# Patient Record
Sex: Female | Born: 1950 | Race: Black or African American | Hispanic: No | Marital: Married | Smoking: Former smoker
Health system: Southern US, Community
[De-identification: ages and names within clinical notes are randomized; demographics above are authoritative.]

## PROBLEM LIST (undated history)

## (undated) ENCOUNTER — Inpatient Hospital Stay: Discharge: 2014-07-08 | Disposition: A | Discharge status: Home / Self Care

## (undated) DIAGNOSIS — M545 Low back pain, unspecified: Secondary | ICD-10-CM

## (undated) DIAGNOSIS — J441 Chronic obstructive pulmonary disease with (acute) exacerbation: Principal | ICD-10-CM

## (undated) DIAGNOSIS — Z1231 Encounter for screening mammogram for malignant neoplasm of breast: Secondary | ICD-10-CM

## (undated) DIAGNOSIS — M1990 Unspecified osteoarthritis, unspecified site: Secondary | ICD-10-CM

## (undated) DIAGNOSIS — R0602 Shortness of breath: Secondary | ICD-10-CM

## (undated) DIAGNOSIS — Z5189 Encounter for other specified aftercare: Secondary | ICD-10-CM

## (undated) DIAGNOSIS — K219 Gastro-esophageal reflux disease without esophagitis: Secondary | ICD-10-CM

## (undated) DIAGNOSIS — G912 (Idiopathic) normal pressure hydrocephalus: Secondary | ICD-10-CM

## (undated) DIAGNOSIS — R4182 Altered mental status, unspecified: Secondary | ICD-10-CM

## (undated) DIAGNOSIS — R000 Tachycardia, unspecified: Secondary | ICD-10-CM

## (undated) DIAGNOSIS — M419 Scoliosis, unspecified: Secondary | ICD-10-CM

## (undated) DIAGNOSIS — I10 Essential (primary) hypertension: Secondary | ICD-10-CM

## (undated) DIAGNOSIS — E785 Hyperlipidemia, unspecified: Secondary | ICD-10-CM

## (undated) DIAGNOSIS — E119 Type 2 diabetes mellitus without complications: Secondary | ICD-10-CM

## (undated) DIAGNOSIS — I639 Cerebral infarction, unspecified: Principal | ICD-10-CM

## (undated) HISTORY — DX: Unspecified osteoarthritis, unspecified site: M19.90

## (undated) HISTORY — DX: Scoliosis, unspecified: M41.9

## (undated) HISTORY — PX: TUBAL LIGATION: SHX77

## (undated) HISTORY — PX: LUMBAR PUNCTURE: SHX1985

## (undated) HISTORY — DX: Tachycardia, unspecified: R00.0

## (undated) HISTORY — DX: Cerebral infarction, unspecified: I63.9

## (undated) HISTORY — PX: UPPER GASTROINTESTINAL ENDOSCOPY: SHX188

## (undated) HISTORY — DX: Encounter for other specified aftercare: Z51.89

---

## 1998-05-07 ENCOUNTER — Other Ambulatory Visit: Admission: RE | Admit: 1998-05-07 | Discharge: 1998-05-07 | Payer: Self-pay

## 1999-11-24 ENCOUNTER — Emergency Department (HOSPITAL_COMMUNITY): Admission: EM | Admit: 1999-11-24 | Discharge: 1999-11-24 | Payer: Self-pay | Admitting: Emergency Medicine

## 2001-05-12 ENCOUNTER — Ambulatory Visit (HOSPITAL_COMMUNITY): Admission: RE | Admit: 2001-05-12 | Discharge: 2001-05-12 | Payer: Self-pay | Admitting: Internal Medicine

## 2001-05-12 ENCOUNTER — Encounter: Payer: Self-pay | Admitting: Internal Medicine

## 2001-06-16 ENCOUNTER — Other Ambulatory Visit: Admission: RE | Admit: 2001-06-16 | Discharge: 2001-06-16 | Payer: Self-pay | Admitting: Internal Medicine

## 2004-09-23 ENCOUNTER — Inpatient Hospital Stay (HOSPITAL_COMMUNITY): Admission: EM | Admit: 2004-09-23 | Discharge: 2004-09-25 | Payer: Self-pay | Admitting: Emergency Medicine

## 2004-09-23 ENCOUNTER — Ambulatory Visit: Payer: Self-pay | Admitting: Cardiology

## 2004-09-24 ENCOUNTER — Encounter: Payer: Self-pay | Admitting: Cardiology

## 2005-10-26 ENCOUNTER — Emergency Department (HOSPITAL_COMMUNITY): Admission: EM | Admit: 2005-10-26 | Discharge: 2005-10-27 | Payer: Self-pay | Admitting: Emergency Medicine

## 2006-05-10 ENCOUNTER — Emergency Department (HOSPITAL_COMMUNITY): Admission: EM | Admit: 2006-05-10 | Discharge: 2006-05-10 | Payer: Self-pay | Admitting: Emergency Medicine

## 2006-05-22 ENCOUNTER — Emergency Department (HOSPITAL_COMMUNITY): Admission: EM | Admit: 2006-05-22 | Discharge: 2006-05-22 | Payer: Self-pay | Admitting: Emergency Medicine

## 2008-09-20 ENCOUNTER — Ambulatory Visit (HOSPITAL_COMMUNITY): Admission: RE | Admit: 2008-09-20 | Discharge: 2008-09-20 | Payer: Self-pay | Admitting: Internal Medicine

## 2009-08-12 ENCOUNTER — Emergency Department (HOSPITAL_COMMUNITY): Admission: EM | Admit: 2009-08-12 | Discharge: 2009-08-12 | Payer: Self-pay | Admitting: Emergency Medicine

## 2009-08-12 ENCOUNTER — Inpatient Hospital Stay (HOSPITAL_COMMUNITY): Admission: EM | Admit: 2009-08-12 | Discharge: 2009-08-15 | Payer: Self-pay | Admitting: Emergency Medicine

## 2011-01-01 ENCOUNTER — Other Ambulatory Visit (HOSPITAL_COMMUNITY): Payer: Self-pay | Admitting: Internal Medicine

## 2011-01-01 DIAGNOSIS — Z1231 Encounter for screening mammogram for malignant neoplasm of breast: Secondary | ICD-10-CM

## 2011-01-08 ENCOUNTER — Ambulatory Visit (HOSPITAL_COMMUNITY)
Admission: RE | Admit: 2011-01-08 | Discharge: 2011-01-08 | Disposition: A | Discharge status: Home / Self Care | Payer: 59 | Source: Ambulatory Visit | Attending: Internal Medicine | Admitting: Internal Medicine

## 2011-01-08 DIAGNOSIS — Z1231 Encounter for screening mammogram for malignant neoplasm of breast: Secondary | ICD-10-CM | POA: Insufficient documentation

## 2011-01-22 LAB — DIFFERENTIAL
Basophils Absolute: 0 10*3/uL (ref 0.0–0.1)
Basophils Relative: 0 % (ref 0–1)
Eosinophils Absolute: 0.1 10*3/uL (ref 0.0–0.7)
Eosinophils Relative: 1 % (ref 0–5)
Lymphocytes Relative: 38 % (ref 12–46)
Lymphs Abs: 3 K/uL (ref 0.7–4.0)
Monocytes Absolute: 0.4 10*3/uL (ref 0.1–1.0)
Monocytes Relative: 5 % (ref 3–12)
Neutro Abs: 4.5 K/uL (ref 1.7–7.7)
Neutrophils Relative %: 56 % (ref 43–77)

## 2011-01-22 LAB — POCT CARDIAC MARKERS: Myoglobin, poc: 96.6 ng/mL (ref 12–200)

## 2011-01-22 LAB — CBC
HCT: 41.4 % (ref 36.0–46.0)
Hemoglobin: 12.4 g/dL (ref 12.0–15.0)
Hemoglobin: 13.9 g/dL (ref 12.0–15.0)
MCHC: 33.6 g/dL (ref 30.0–36.0)
MCHC: 33.6 g/dL (ref 30.0–36.0)
MCV: 83.8 fL (ref 78.0–100.0)
MCV: 84.1 fL (ref 78.0–100.0)
Platelets: 205 10*3/uL (ref 150–400)
RBC: 4.4 MIL/uL (ref 3.87–5.11)
RBC: 4.94 MIL/uL (ref 3.87–5.11)
RDW: 13 % (ref 11.5–15.5)
RDW: 13.2 % (ref 11.5–15.5)
WBC: 8 K/uL (ref 4.0–10.5)

## 2011-01-22 LAB — LIPID PANEL
HDL: 25 mg/dL — ABNORMAL LOW (ref >39)
Total CHOL/HDL Ratio: 5.5 RATIO
Triglycerides: 248 mg/dL — ABNORMAL HIGH (ref <150)
VLDL: 50 mg/dL — ABNORMAL HIGH (ref 0–40)

## 2011-01-22 LAB — HEMOGLOBIN A1C
Hgb A1c MFr Bld: 9.4 % — ABNORMAL HIGH (ref 4.6–6.1)
Mean Plasma Glucose: 223 mg/dL

## 2011-01-22 LAB — BASIC METABOLIC PANEL
BUN: 14 mg/dL (ref 6–23)
CO2: 28 mEq/L (ref 19–32)
CO2: 29 mEq/L (ref 19–32)
Calcium: 8.9 mg/dL (ref 8.4–10.5)
Calcium: 9.2 mg/dL (ref 8.4–10.5)
Chloride: 98 mEq/L (ref 96–112)
Creatinine, Ser: 0.8 mg/dL (ref 0.4–1.2)
GFR calc Af Amer: >60 mL/min (ref >60)
Glucose, Bld: 163 mg/dL — ABNORMAL HIGH (ref 70–99)
Glucose, Bld: 262 mg/dL — ABNORMAL HIGH (ref 70–99)
Sodium: 134 mEq/L — ABNORMAL LOW (ref 135–145)
Sodium: 136 mEq/L (ref 135–145)

## 2011-01-22 LAB — GLUCOSE, CAPILLARY
Glucose-Capillary: 182 mg/dL — ABNORMAL HIGH (ref 70–99)
Glucose-Capillary: 230 mg/dL — ABNORMAL HIGH (ref 70–99)
Glucose-Capillary: 254 mg/dL — ABNORMAL HIGH (ref 70–99)

## 2011-01-22 LAB — CK TOTAL AND CKMB (NOT AT ARMC)
CK, MB: 2.6 ng/mL (ref 0.3–4.0)
Relative Index: 1.7 (ref 0.0–2.5)

## 2011-01-22 LAB — BASIC METABOLIC PANEL WITH GFR
Chloride: 99 meq/L (ref 96–112)
GFR calc Af Amer: >60 mL/min (ref >60)
GFR calc non Af Amer: >60 mL/min (ref >60)
Potassium: 3.9 meq/L (ref 3.5–5.1)

## 2011-01-22 LAB — CARDIAC PANEL(CRET KIN+CKTOT+MB+TROPI)
CK, MB: 1.6 ng/mL (ref 0.3–4.0)
CK, MB: 1.8 ng/mL (ref 0.3–4.0)
Relative Index: 1.3 (ref 0.0–2.5)
Total CK: 140 U/L (ref 7–177)
Troponin I: 0.01 ng/mL (ref 0.00–0.06)

## 2011-03-06 NOTE — Discharge Summary (Signed)
NAME:  Dawn [name], Dawn [name]                ACCOUNT NO.:  [id number]   MEDICAL RECORD NO.:  [id number]          PATIENT TYPE:  INP   LOCATION:  4731                         FACILITY:  [facility]   PHYSICIAN:  Mobolaji B. Bakare, M.D.DATE OF BIRTH:  [birth year]   DATE OF ADMISSION:  09/23/2004  DATE OF DISCHARGE:  09/25/2004                                 DISCHARGE SUMMARY   PRIMARY CARE PHYSICIAN:  Cala Bradford R. Renae Gloss, M.D.   FINAL DIAGNOSES:  Flash pulmonary edema.  Hypertensive emergency.  Non-  compliance with medication.  Diabetes mellitus.  Hypertension.  Microcytic  anemia.  Positive 2D echo which showed ejection fraction of 55 to 65%.  Normal right ventricular size and function, normal left ventricular  function.   CHIEF COMPLAINT:  Cough and shortness of breath.   BRIEF HISTORY:  Please refer to admission H&P for full details.  In brief,  the patient is a 60 year old, African-American female who has a history of  diabetes, hypertension, and she has not been compliant with her medications  because of insurance issues and co-payments.  She presented with acute onset  of shortness of breath, wheezing, there was no accompanying palpitations,  chest pain, diaphoresis.  She was brought to the emergency department in the  company of her husband and evaluation of her blood pressure was 260/140 with  a heart rate of 152 and a respiratory rate of 24.  Chest x-ray showed that  she was in acute pulmonary edema.  She was given 0.2 mg of clonidine in the  emergency department and subsequently admitted onto telemetry for further  treatment and evaluation.   PERTINENT FINDINGS ON PHYSICAL EXAMINATION:  VITAL SIGNS:  Initial vitals as  stated above.  GENERAL:  On examination, she was not in acute obvious respiration at the  time of our evaluation.  No elevated JVD.  No thyromegaly.  No carotid  bruit.  LUNGS:  Decreased breath sounds bilaterally with some bibasilar crackles.  CARDIOVASCULAR:  S1 and  S2.  Regular.  No murmur.  Tachycardic.  ABDOMEN:  Obese, soft, non-tender.  No hepatosplenomegaly.  Bowel sounds are  present.  No holosystolic murmur.  EXTREMITIES:  No pedal edema.  CNS:  No neurological deficit.   PERTINENT LABORATORY AND X-RAY DATA:  White cell count of 13.5, hemoglobin  11.1, hematocrit 24.6, MCV 67.9, RDW 17.7.  Neutrophils __________  %,  lymphocytes 15%, sodium 134, potassium 3.6, chloride 99, CO2 26, glucose  309, BUN 4, creatinine 0.8, total protein 7.4, albumin 3.5, glycosylated  hemoglobin - hemoglobin A1c 10.3, troponin 0.11, fasting lipid profile with  a cholesterol of 129, triglyceride 205, HDL 30, VLDL 41, LDL 58.  TSH 0.49,  free T4 1.13, iron 32, TIBC 362, saturation 9%, ferritin 40, UA was  negative.  Urine drug screen was negative.  Blood culture negative.  D-dimer  0.82.  BNP 126.   Chest x-ray showed congestive heart failure with cardiomegaly.  EKG showed  sinus tachycardia with fusion complexes, right atrial enlargement,  __________  voltage criteria for LVH, left axis deviation, and ST and T-wave  non-specific abnormalities.  HOSPITAL COURSE:  The patient was admitted onto telemetry and her blood  pressure was gradually down by way of instituting home medications and also  giving hydralazine IV p.r.n.Marland Kitchen  She received Lasix IV.  She diuresed quite  well.  There was no significant arrhythmia on telemetry.  Her troponin  trended downwards and this was felt to be rate related and also secondary to  acute pulmonary congestion.  Despite instituting the patient's home  antihypertensives, the blood pressure was still uncontrolled, hence  hydrochlorothiazide 25 mg was added.  The blood pressure improved gradually  and at the time of discharge, blood pressure was 140/70.   Diabetes.  The patient's hemoglobin A1c was quite elevated and she did admit  that she has not been taking her medications.  She was restarted on her  Glucophage 1 g p.o. b.i.d. and  glipizide 10 mg p.o. b.i.d.  This should  suffice for now until she rechecks her hemoglobin A1c in three to six months  time.   Microcytic anemia.  The patient's hemoglobin and hematocrit were within  normal; however, she has a ______mcv____  of 68.5.  She does not have any  history of sickle cell.  Iron studies revealed ferritin on the low side of  normal and TIBC on the upper limit of normal and she was started on iron  supplementation.   Leukocytosis and low grade fever at time of admission.  The patient was  empirically started on Avelox for presume pneumonia.  This was discontinued  when all cultures turned negative and the patient did not have any  infiltrates on chest x-ray clinically as pneumonia.   Overall, the patient's problem was precipitated by non-compliance with  medications.  She was seen by the care manager to help with her insurance.  The patient stated that she would have to re-organize her insurance.   DISCHARGE MEDICATIONS:  Aspirin 81 mg p.o. daily, Glucotrol ER 10 mg p.o.  b.i.d., Glucophage 1 g two times per day, Diovan 160 once a day,  hydrochlorothiazide 25 mg one per day, ferrous sulfate three to four p.o.  b.i.d.   DIET:  Low cholesterol diet.  Diabetic diet.   The patient was instructed to exercise regularly and try to lose some  weight.   CONDITION ON DISCHARGE:  She was stable clinically and asymptomatic at the  time of discharge.   FOLLOW UP:  Follow up with Dr. Kellie Shropshire in one to two weeks.  Patient is  to call for an appointment.   RECOMMENDATIONS:  Recheck TSH in six months.      Mobo   MBB/MEDQ  D:  10/06/2004  T:  10/06/2004  Job:  [id number]   cc:   Merlene Laughter. Renae Gloss, M.D.  [address]  [address]  [place]  [place] [address]  Fax: [phone]

## 2011-03-06 NOTE — H&P (Signed)
NAMESILVER, ACHEY                ACCOUNT NO.:  [id number]   MEDICAL RECORD NO.:  [id number]          PATIENT TYPE:  INP   LOCATION:  1828                         FACILITY:  [facility]   PHYSICIAN:  Michaelyn Barter, M.D. DATE OF BIRTH:  [birth year]   DATE OF ADMISSION:  09/23/2004  DATE OF DISCHARGE:                                HISTORY & PHYSICAL   PRIMARY CARE PHYSICIAN:  Kellie Shropshire, M.D.   CHIEF COMPLAINT:  Cough, shortness of breath and headache.   HISTORY OF PRESENT ILLNESS:  The patient is a 60 year old female who states  that two nights ago she developed a productive cough which was described as  constant and lasted for several hours.  Approximately an hour or so after  the cough ensued the patient became short of breath.  She felt herself  wheezing at the time.  Since then she has denied having any nausea,  vomiting, fevers or chills, so that her temperature was measured at home and  was noted to be 99.  She also denies chest pain.  When asked about the  progression of her shortness of breath, she initially stated that there was  no progression since its initial onset.  However, her husband is in the room  at her bedside and he states that the patient has been short of breath since  Sunday night.  The patient denies symptoms of orthopnea, stating that she  sleeps on two pillows and this has not changed over the past few days.  Likewise, she also denies symptoms of PND.  She developed a headache  yesterday which was relieved with Tylenol.  She denies having any visual  changes.  She went on to state that she has not taken any of her blood  pressure medications for at least one month.  The reason that she gives is  that she owes her primary care physician, Dr. Kellie Shropshire, several hundred  dollars and cannot afford to go back to see her primary physician because  she does not have the money at this particular time.   PAST MEDICAL HISTORY:  1.  Diabetes, diagnosed over 10  years ago.  2.  Hypertension.   PAST SURGICAL HISTORY:  None.   ALLERGIES:  NONE.   HOME MEDICATIONS:  1.  Glucophage 1000 mg p.o. b.i.d.  2.  Glipizide ER 10 mg one tablet p.o. b.i.d.  3.  Diovan 80 mg 1 tablet daily.   FAMILY HISTORY:  Mother has a history of hypertension and diabetes mellitus.  The father the patient does not know his medical history.   SOCIAL HISTORY:  Cigarettes.  The patient started at the age of 79 but  stopped in her 30s.  She smoked approximately one pack per day.  Alcohol:  Started in her 39s and stopped at the age of 89.  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   REVIEW OF SYSTEMS:  As per HPI, otherwise all other systems are negative.   PHYSICAL EXAM:  GENERAL:  There  is no obvious distress.  The patient is  cooperative.  VITALS:  On initial presentation into the emergency room the patient's  temperature was recorded as being 100.7, blood pressure 260/140, heart rate  152, respirations 24.  HEENT:  Anicteric, extraocular movements are intact.  Right pupil, there is  no papilledema present.  NECK:  Supple, no lymphadenopathy.  THYROID:  Not palpable, good carotid upstroke, no carotid bruits  auscultated.  CARDIAC:  S1, S2 present.  There are soft heart sounds/heart sounds are  slightly distant.  RESPIRATORY:  Breath sounds are decreased slightly bilaterally.  ABDOMEN:  Soft, nontender, nondistended.  Hypoactive bowel sounds.  EXTREMITIES:  No leg edema.  NEUROLOGIC:  The patient is alert and oriented x3.  MUSCULOSKELETAL:  Upper and lower extremity strength 5/5.   Chest x-ray is interpreted by the radiologist as cardiomegaly with edema.   LABS:  BUN is 4, creatinine is 0.8, glucose is 309.  BNP is 135.9.   ASSESSMENT/PLAN:  1.  Acute shortness of breath.  This is most likely to be multifactorial in      origin and the symptoms of which were most likely precipitated  by the      patient's hypertensive urgency.  With regards to the shortness of      breath, the patient's chest x-ray is consistent with pulmonary edema.      Likewise, the patient's chest x-ray reveals cardiomegaly, therefore,      although the patient does not have symptoms that are classic for      congestive heart failure, i.e. she denies paroxysmal nocturnal dyspnea      and orthopnea and her BNP is only slightly elevated.  There may be a      component of congestive heart failure present.  At this point in time,      however, the most important factor to control is the patient's blood      pressure.  For now will provide oxygen.  Will start Lasix 20 mg IV      b.i.d.  Will monitor the patient's ins and outs and daily weights.  Will      also order cardiac enzymes x3 q.8 hours apart.  Even though the patient      does not complain of any chest pain at this particular time, the patient      does have a history of diabetes mellitus and sometimes those patients      may have what is referred to as atypical or silent ischemia, therefore,      will rule out a cardiac event via checking cardiac enzymes.  Will also      follow the patient's BNP and will order a 2D echocardiogram to evaluate      the patient's ejection fraction and also look for any structural      abnormalities of the ventricles and valves.  2.  Hypertensive urgency.  This most likely precipitated #1 above and is      also due to the patient's lack of compliance with her antihypertensive      medications.  Will restart Diovan 80 mg daily.  Will also add      hydralazine 2 mg IV q.4 hours as a p.r.n. medication for now, titrating      up as needed.  May also consider adding something such as clonidine to      the patient's antihypertensive regimen primarily because this additional      medication is cheap in  cost and I am concerned that the patient may not      be able to afford to pay for her medications once she is discharged  from     the hospital, which may precipitate the same event that brought her into      the hospital this time.  3.  Diabetes mellitus.  Will restart the patient's home medication for now.      Will check her hemoglobin A1c and fasting lipid profile.  Will also      perform Accu-Cheks a.c. and q.h.s. and will start aspirin therapy.  4.  Cough with production.  This may have been secondary to the pulmonary      edema that was described in #1      above.  However, in light of fever will treat empirically with IV      antibiotics.  Will also send a sputum for Gram stain, culture and      sensitivity.  Will check blood cultures times two.  5.  Deep vein thrombosis prophylaxis.  Will provide Lovenox 40 mg      subcutaneously q.24 hours.      Orla   OR/MEDQ  D:  09/23/2004  T:  09/23/2004  Job:  [id number]   cc:   Merlene Laughter. Renae Gloss, M.D.  [address]  [address]  [place]  [place] [address]  Fax: [phone]

## 2011-12-17 ENCOUNTER — Other Ambulatory Visit (HOSPITAL_COMMUNITY): Payer: Self-pay | Admitting: Internal Medicine

## 2011-12-17 DIAGNOSIS — Z1231 Encounter for screening mammogram for malignant neoplasm of breast: Secondary | ICD-10-CM

## 2012-01-11 ENCOUNTER — Ambulatory Visit (HOSPITAL_COMMUNITY): Payer: 59 | Attending: Internal Medicine

## 2012-02-18 ENCOUNTER — Ambulatory Visit (HOSPITAL_COMMUNITY): Payer: 59

## 2012-02-18 ENCOUNTER — Ambulatory Visit (HOSPITAL_COMMUNITY)
Admission: RE | Admit: 2012-02-18 | Discharge: 2012-02-18 | Disposition: A | Discharge status: Home / Self Care | Payer: 59 | Source: Ambulatory Visit | Attending: Internal Medicine | Admitting: Internal Medicine

## 2012-02-18 DIAGNOSIS — Z1231 Encounter for screening mammogram for malignant neoplasm of breast: Secondary | ICD-10-CM

## 2012-07-19 LAB — CBC WITH DIFFERENTIAL
Atypical Lymphocytes: 3 %
Bands Relative: 4 % — ABNORMAL LOW (ref 5–11)
Basophils %: 0.7 %
Basophils Absolute: 0 10*3/uL (ref 0.0–0.2)
Eosinophils %: 2 %
Eosinophils Absolute: 0.1 10*3/uL (ref 0.0–0.7)
Hematocrit: 42.2 % (ref 37.0–47.0)
Hemoglobin: 14 g/dL (ref 12.0–16.0)
Lymphocytes %: 60 %
Lymphocytes Absolute: 3.3 10*3/uL (ref 1.0–4.8)
MCH: 29.2 pg (ref 27.0–31.3)
MCHC: 33.2 % (ref 33.0–37.0)
MCV: 88 fL (ref 82.0–100.0)
MPV: 9.6 fL (ref 7.4–10.4)
Monocytes %: 5.7 %
Monocytes Absolute: 6 10*3/uL — ABNORMAL HIGH (ref 0.2–0.8)
Neutrophils %: 26 %
Neutrophils Absolute: 1.6 10*3/uL (ref 1.4–6.5)
PLATELET SLIDE REVIEW: ADEQUATE
Platelets: 237 10*3/uL (ref 130–400)
RBC: 4.8 M/uL (ref 4.20–5.40)
RDW: 14.2 % (ref 11.5–14.5)
WBC: 5.3 10*3/uL (ref 4.8–10.8)

## 2012-07-19 LAB — COMPREHENSIVE METABOLIC PANEL
ALT: 32 U/L (ref 0–63)
AST: 29 U/L (ref 0–35)
Albumin: 4.1 g/dL (ref 3.5–5.2)
Alkaline Phosphatase: 82 U/L (ref 40–135)
Anion Gap: 10 mEq/L (ref 7–13)
BUN: 18 mg/dL (ref 10–26)
CO2: 31 mEq/L (ref 22–32)
Calcium: 9.4 mg/dL (ref 8.5–10.2)
Chloride: 104 mEq/L (ref 99–111)
Creatinine: 1.06 mg/dL (ref 0.50–1.10)
GFR African American: >60 (ref >60)
GFR Non-African American: 56.3 — ABNORMAL LOW (ref >60)
Globulin: 2.3 g/dL (ref 2.3–3.5)
Glucose: 110 mg/dL (ref 60–115)
Potassium: 4.4 mEq/L (ref 3.5–5.5)
Sodium: 145 mEq/L (ref 135–146)
Total Bilirubin: 0.4 mg/dL (ref 0.2–1.1)
Total Protein: 6.4 g/dL (ref 6.4–8.1)

## 2012-07-19 LAB — TROPONIN: Troponin I: 0 ng/dL (ref 0.000–0.040)

## 2013-01-09 ENCOUNTER — Encounter (HOSPITAL_COMMUNITY): Payer: Self-pay | Admitting: Emergency Medicine

## 2013-01-09 ENCOUNTER — Emergency Department (HOSPITAL_COMMUNITY): Payer: 59

## 2013-01-09 ENCOUNTER — Inpatient Hospital Stay (HOSPITAL_COMMUNITY)
Admission: EM | Admit: 2013-01-09 | Discharge: 2013-01-12 | DRG: 064 | Disposition: A | Discharge status: Home Health | Payer: 59 | Attending: Internal Medicine | Admitting: Internal Medicine

## 2013-01-09 DIAGNOSIS — I6350 Cerebral infarction due to unspecified occlusion or stenosis of unspecified cerebral artery: Principal | ICD-10-CM | POA: Diagnosis present

## 2013-01-09 DIAGNOSIS — G929 Unspecified toxic encephalopathy: Secondary | ICD-10-CM | POA: Diagnosis present

## 2013-01-09 DIAGNOSIS — G92 Toxic encephalopathy: Secondary | ICD-10-CM | POA: Diagnosis present

## 2013-01-09 DIAGNOSIS — E1142 Type 2 diabetes mellitus with diabetic polyneuropathy: Secondary | ICD-10-CM | POA: Diagnosis present

## 2013-01-09 DIAGNOSIS — R000 Tachycardia, unspecified: Secondary | ICD-10-CM

## 2013-01-09 DIAGNOSIS — E785 Hyperlipidemia, unspecified: Secondary | ICD-10-CM | POA: Diagnosis present

## 2013-01-09 DIAGNOSIS — IMO0002 Reserved for concepts with insufficient information to code with codable children: Secondary | ICD-10-CM

## 2013-01-09 DIAGNOSIS — I10 Essential (primary) hypertension: Secondary | ICD-10-CM

## 2013-01-09 DIAGNOSIS — E1165 Type 2 diabetes mellitus with hyperglycemia: Secondary | ICD-10-CM

## 2013-01-09 DIAGNOSIS — I739 Peripheral vascular disease, unspecified: Secondary | ICD-10-CM | POA: Diagnosis present

## 2013-01-09 DIAGNOSIS — E119 Type 2 diabetes mellitus without complications: Secondary | ICD-10-CM

## 2013-01-09 DIAGNOSIS — Z8673 Personal history of transient ischemic attack (TIA), and cerebral infarction without residual deficits: Secondary | ICD-10-CM

## 2013-01-09 DIAGNOSIS — N390 Urinary tract infection, site not specified: Secondary | ICD-10-CM

## 2013-01-09 DIAGNOSIS — M5126 Other intervertebral disc displacement, lumbar region: Secondary | ICD-10-CM | POA: Diagnosis present

## 2013-01-09 DIAGNOSIS — E1149 Type 2 diabetes mellitus with other diabetic neurological complication: Secondary | ICD-10-CM | POA: Diagnosis present

## 2013-01-09 DIAGNOSIS — Z794 Long term (current) use of insulin: Secondary | ICD-10-CM

## 2013-01-09 DIAGNOSIS — I517 Cardiomegaly: Secondary | ICD-10-CM | POA: Diagnosis present

## 2013-01-09 DIAGNOSIS — M47817 Spondylosis without myelopathy or radiculopathy, lumbosacral region: Secondary | ICD-10-CM | POA: Diagnosis present

## 2013-01-09 DIAGNOSIS — R262 Difficulty in walking, not elsewhere classified: Secondary | ICD-10-CM | POA: Diagnosis present

## 2013-01-09 DIAGNOSIS — I639 Cerebral infarction, unspecified: Secondary | ICD-10-CM

## 2013-01-09 DIAGNOSIS — R739 Hyperglycemia, unspecified: Secondary | ICD-10-CM

## 2013-01-09 DIAGNOSIS — R509 Fever, unspecified: Secondary | ICD-10-CM

## 2013-01-09 HISTORY — DX: Type 2 diabetes mellitus without complications: E11.9

## 2013-01-09 HISTORY — DX: Essential (primary) hypertension: I10

## 2013-01-09 LAB — COMPREHENSIVE METABOLIC PANEL
Albumin: 3.9 g/dL (ref 3.5–5.2)
Alkaline Phosphatase: 76 U/L (ref 39–117)
BUN: 17 mg/dL (ref 6–23)
Creatinine, Ser: 0.93 mg/dL (ref 0.50–1.10)
GFR calc Af Amer: 76 mL/min — ABNORMAL LOW (ref >90)
Glucose, Bld: 382 mg/dL — ABNORMAL HIGH (ref 70–99)
Potassium: 3.5 mEq/L (ref 3.5–5.1)
Total Protein: 8.2 g/dL (ref 6.0–8.3)

## 2013-01-09 LAB — CBC WITH DIFFERENTIAL/PLATELET
Basophils Relative: 0 % (ref 0–1)
Eosinophils Absolute: 0 10*3/uL (ref 0.0–0.7)
Eosinophils Relative: 0 % (ref 0–5)
HCT: 36.9 % (ref 36.0–46.0)
Hemoglobin: 13.1 g/dL (ref 12.0–15.0)
Lymphs Abs: 1.3 10*3/uL (ref 0.7–4.0)
MCH: 27.8 pg (ref 26.0–34.0)
MCHC: 35.5 g/dL (ref 30.0–36.0)
MCV: 78.3 fL (ref 78.0–100.0)
Monocytes Absolute: 0.5 10*3/uL (ref 0.1–1.0)
Monocytes Relative: 4 % (ref 3–12)
Neutrophils Relative %: 85 % — ABNORMAL HIGH (ref 43–77)
RBC: 4.71 MIL/uL (ref 3.87–5.11)

## 2013-01-09 LAB — URINALYSIS, ROUTINE W REFLEX MICROSCOPIC
Ketones, ur: NEGATIVE mg/dL
Leukocytes, UA: NEGATIVE
Nitrite: NEGATIVE
Protein, ur: NEGATIVE mg/dL
pH: 6.5 (ref 5.0–8.0)

## 2013-01-09 LAB — URINE MICROSCOPIC-ADD ON

## 2013-01-09 MED ORDER — SODIUM CHLORIDE 0.9 % IV BOLUS (SEPSIS)
1000.0000 mL | Freq: Once | INTRAVENOUS | Status: AC
  Administered 2013-01-09: 1000 mL via INTRAVENOUS

## 2013-01-09 MED ORDER — SODIUM CHLORIDE 0.9 % IV BOLUS (SEPSIS)
1000.0000 mL | Freq: Once | INTRAVENOUS | Status: AC
  Administered 2013-01-10: 1000 mL via INTRAVENOUS

## 2013-01-09 NOTE — ED Notes (Signed)
Patient resting of stretcher at this time. Patient denies any complaints at this time. Family member at bedside with patient. Plan of care discussed. Call light within reach. Will continue to monitor.

## 2013-01-09 NOTE — ED Notes (Signed)
FAMILY REPORTS GENERALIZED WEAKNESS WITH INCONTINENT ( STOOL) EPISODE THIS MORNING , HYPERTENSIVE AND TACHYCARDIC AT TRIAGE , ALERT AND ORIENTED X4 AT TRIAGE , RESPIRATIONS UNLABORED/ DENIES PAIN .

## 2013-01-09 NOTE — ED Provider Notes (Signed)
History     CSN: [id number]  Arrival date & time 01/09/13  2036   First MD Initiated Contact with Patient 01/09/13 2053      Chief Complaint  Patient presents with  . Weakness  . Hypertension  . Tachycardia    (Consider location/radiation/quality/duration/timing/severity/associated sxs/prior treatment) Patient is a 62 y.o. female presenting with weakness and hypertension.  Weakness Pertinent negatives include no chest pain, no abdominal pain, no headaches and no shortness of breath.  Hypertension Pertinent negatives include no chest pain, no abdominal pain, no headaches and no shortness of breath.   patient is without complaints. She states that she feels fine. She states she has had some urinary and fecal incontinence. No chest pain. No headache. Confusion. No weakness. No Back pain. She states she has had some urinary frequency. She does have a tachycardia and hypertension upon arrival. No chest pain.  Past Medical History  Diagnosis Date  . Diabetes mellitus without complication   . Hypertension     History reviewed. No pertinent past surgical history.  History reviewed. No pertinent family history.  History  Substance Use Topics  . Smoking status: Never Smoker   . Smokeless tobacco: Not on file  . Alcohol Use: No    OB History   Grav Para Term Preterm Abortions TAB SAB Ect Mult Living                  Review of Systems  Constitutional: Negative for fever, activity change and appetite change.  HENT: Negative for neck stiffness.   Eyes: Negative for pain.  Respiratory: Negative for chest tightness and shortness of breath.   Cardiovascular: Negative for chest pain and leg swelling.  Gastrointestinal: Negative for nausea, vomiting, abdominal pain and diarrhea.  Genitourinary: Negative for flank pain.  Musculoskeletal: Negative for back pain.  Skin: Negative for rash.  Neurological: Negative for weakness, numbness and headaches.  Psychiatric/Behavioral:  Negative for behavioral problems.    Allergies  Review of patient's allergies indicates no known allergies.  Home Medications   Current Outpatient Rx  Name  Route  Sig  Dispense  Refill  . insulin detemir (LEVEMIR) 100 UNIT/ML injection   Subcutaneous   Inject 18 Units into the skin 2 (two) times daily.         . metFORMIN (GLUCOPHAGE) 1000 MG tablet   Oral   Take 1,000 mg by mouth 2 (two) times daily with a meal.         . sitaGLIPtin (JANUVIA) 50 MG tablet   Oral   Take 50 mg by mouth 2 (two) times daily.           BP 152/102  Pulse 130  Temp(Src) 98.9 F (37.2 C) (Oral)  Resp 24  SpO2 95%  Physical Exam  Nursing note and vitals reviewed. Constitutional: She is oriented to person, place, and time. She appears well-developed and well-nourished.  HENT:  Head: Normocephalic and atraumatic.  Eyes: EOM are normal. Pupils are equal, round, and reactive to light.  Neck: Normal range of motion. Neck supple.  Cardiovascular: Regular rhythm and normal heart sounds.   No murmur heard. tachycardia  Pulmonary/Chest: Effort normal and breath sounds normal. No respiratory distress. She has no wheezes. She has no rales.  Abdominal: Soft. Bowel sounds are normal. She exhibits no distension. There is no tenderness. There is no rebound and no guarding.  Genitourinary:  No CVA tenderness  Musculoskeletal: Normal range of motion.  Neurological: She is alert and oriented to  person, place, and time. No cranial nerve deficit.  Skin: Skin is warm and dry.  Psychiatric: She has a normal mood and affect. Her speech is normal.    ED Course  Procedures (including critical care time)  Labs Reviewed  CBC WITH DIFFERENTIAL - Abnormal; Notable for the following:    WBC 12.1 (*)    Neutrophils Relative 85 (*)    Neutro Abs 10.2 (*)    Lymphocytes Relative 11 (*)    All other components within normal limits  COMPREHENSIVE METABOLIC PANEL - Abnormal; Notable for the following:     Chloride 94 (*)    Glucose, Bld 382 (*)    GFR calc non Af Amer 66 (*)    GFR calc Af Amer 76 (*)    All other components within normal limits  URINALYSIS, ROUTINE W REFLEX MICROSCOPIC - Abnormal; Notable for the following:    APPearance CLOUDY (*)    Glucose, UA >1000 (*)    Hgb urine dipstick SMALL (*)    All other components within normal limits  URINE MICROSCOPIC-ADD ON - Abnormal; Notable for the following:    Squamous Epithelial / LPF MANY (*)    Bacteria, UA MANY (*)    Casts HYALINE CASTS (*)    All other components within normal limits  CG4 I-STAT (LACTIC ACID) - Abnormal; Notable for the following:    Lactic Acid, Venous 2.82 (*)    All other components within normal limits  CULTURE, BLOOD (ROUTINE X 2)  CULTURE, BLOOD (ROUTINE X 2)   Dg Chest 2 View  01/09/2013  *RADIOLOGY REPORT*  Clinical Data: Altered mental status and fever.  CHEST - 2 VIEW  Comparison: 09/23/2004.  Findings: The cardiac silhouette, mediastinal and hilar contours are stable.  There is tortuosity and ectasia of the thoracic aorta. There are chronic bronchitic type interstitial lung changes but no definite acute overlying pulmonary process.  No pleural effusion. The bony thorax is intact.  Suspect stone filled gallbladder.  IMPRESSION:  1.  Chronic lung changes but no acute pulmonary findings. 2.  Cholelithiasis.   Original Report Authenticated By: Rudie Meyer, M.D.      1. Fever   2. Tachycardia      Date: 01/10/2013  Rate: 142  Rhythm: sinus tachycardia  QRS Axis: normal  Intervals: normal  ST/T Wave abnormalities: nonspecific ST/T changes  Conduction Disutrbances:left anterior fascicular block  Narrative Interpretation:   Old EKG Reviewed: none available    MDM  Patient presents with mild weakness and fever. Has persistent tachycardia. His sinus tachycardia. Lactic acid is mildly elevated. Has many bacteria in the urine but no white cells. Chest x-ray does not show pneumonia. No abdominal  tenderness. Patient will be seen by internal medicine and possibly admitted. Blood cultures are pending.        Juliet Rude. Rubin Payor, MD 01/10/13 [phone]

## 2013-01-10 ENCOUNTER — Encounter (HOSPITAL_COMMUNITY): Payer: Self-pay | Admitting: *Deleted

## 2013-01-10 DIAGNOSIS — R000 Tachycardia, unspecified: Secondary | ICD-10-CM

## 2013-01-10 DIAGNOSIS — R5081 Fever presenting with conditions classified elsewhere: Secondary | ICD-10-CM

## 2013-01-10 DIAGNOSIS — R509 Fever, unspecified: Secondary | ICD-10-CM

## 2013-01-10 DIAGNOSIS — Z794 Long term (current) use of insulin: Secondary | ICD-10-CM

## 2013-01-10 DIAGNOSIS — I10 Essential (primary) hypertension: Secondary | ICD-10-CM

## 2013-01-10 LAB — GLUCOSE, CAPILLARY
Glucose-Capillary: 222 mg/dL — ABNORMAL HIGH (ref 70–99)
Glucose-Capillary: 167 mg/dL — ABNORMAL HIGH (ref 70–99)
Glucose-Capillary: 192 mg/dL — ABNORMAL HIGH (ref 70–99)
Glucose-Capillary: 194 mg/dL — ABNORMAL HIGH (ref 70–99)
Glucose-Capillary: 174 mg/dL — ABNORMAL HIGH (ref 70–99)

## 2013-01-10 LAB — BASIC METABOLIC PANEL
BUN: 11 mg/dL (ref 6–23)
Calcium: 8.7 mg/dL (ref 8.4–10.5)
GFR calc Af Amer: >90 mL/min (ref >90)
GFR calc non Af Amer: >90 mL/min (ref >90)
Potassium: 4 mEq/L (ref 3.5–5.1)
Sodium: 141 mEq/L (ref 135–145)

## 2013-01-10 LAB — CK: Total CK: 208 U/L — ABNORMAL HIGH (ref 7–177)

## 2013-01-10 LAB — LACTIC ACID, PLASMA: Lactic Acid, Venous: 2.3 mmol/L — ABNORMAL HIGH (ref 0.5–2.2)

## 2013-01-10 LAB — CBC
Hemoglobin: 11.7 g/dL — ABNORMAL LOW (ref 12.0–15.0)
MCHC: 34.1 g/dL (ref 30.0–36.0)
Platelets: 179 10*3/uL (ref 150–400)

## 2013-01-10 LAB — TSH: TSH: 0.282 u[IU]/mL — ABNORMAL LOW (ref 0.350–4.500)

## 2013-01-10 MED ORDER — INSULIN ASPART 100 UNIT/ML ~~LOC~~ SOLN
0.0000 [IU] | Freq: Three times a day (TID) | SUBCUTANEOUS | Status: DC
  Administered 2013-01-10 (×2): 2 [IU] via SUBCUTANEOUS
  Administered 2013-01-11: 3 [IU] via SUBCUTANEOUS
  Administered 2013-01-11: 2 [IU] via SUBCUTANEOUS
  Administered 2013-01-12: 3 [IU] via SUBCUTANEOUS
  Administered 2013-01-12: 2 [IU] via SUBCUTANEOUS

## 2013-01-10 MED ORDER — ACETAMINOPHEN 325 MG PO TABS
650.0000 mg | ORAL_TABLET | Freq: Four times a day (QID) | ORAL | Status: DC | PRN
  Administered 2013-01-10: 650 mg via ORAL
  Filled 2013-01-10: qty 2

## 2013-01-10 MED ORDER — METFORMIN HCL 500 MG PO TABS
1000.0000 mg | ORAL_TABLET | Freq: Two times a day (BID) | ORAL | Status: DC
  Administered 2013-01-10: 1000 mg via ORAL
  Filled 2013-01-10 (×3): qty 2

## 2013-01-10 MED ORDER — POLYETHYLENE GLYCOL 3350 17 G PO PACK: 17.0000 g | PACK | Freq: Every day | ORAL | Status: DC | PRN

## 2013-01-10 MED ORDER — LISINOPRIL 5 MG PO TABS
5.0000 mg | ORAL_TABLET | Freq: Every day | ORAL | Status: DC
  Administered 2013-01-10 – 2013-01-12 (×3): 5 mg via ORAL
  Filled 2013-01-10 (×3): qty 1

## 2013-01-10 MED ORDER — POTASSIUM CHLORIDE IN NACL 20-0.9 MEQ/L-% IV SOLN
INTRAVENOUS | Status: DC
  Filled 2013-01-10 (×2): qty 1000

## 2013-01-10 MED ORDER — ONDANSETRON HCL 4 MG/2ML IJ SOLN: 4.0000 mg | Freq: Four times a day (QID) | INTRAMUSCULAR | Status: DC | PRN

## 2013-01-10 MED ORDER — ALUM & MAG HYDROXIDE-SIMETH 200-200-20 MG/5ML PO SUSP: 30.0000 mL | Freq: Four times a day (QID) | ORAL | Status: DC | PRN

## 2013-01-10 MED ORDER — INSULIN DETEMIR 100 UNIT/ML ~~LOC~~ SOLN
18.0000 [IU] | Freq: Two times a day (BID) | SUBCUTANEOUS | Status: DC
  Administered 2013-01-10 – 2013-01-12 (×6): 18 [IU] via SUBCUTANEOUS
  Filled 2013-01-10 (×9): qty 0.18

## 2013-01-10 MED ORDER — SODIUM CHLORIDE 0.9 % IV SOLN
INTRAVENOUS | Status: DC
  Administered 2013-01-10: 12:00:00 via INTRAVENOUS

## 2013-01-10 MED ORDER — INSULIN ASPART 100 UNIT/ML ~~LOC~~ SOLN: 0.0000 [IU] | Freq: Every day | SUBCUTANEOUS | Status: DC

## 2013-01-10 MED ORDER — DEXTROSE 5 % IV SOLN
1.0000 g | INTRAVENOUS | Status: DC
  Administered 2013-01-10 – 2013-01-12 (×3): 1 g via INTRAVENOUS
  Filled 2013-01-10 (×4): qty 10

## 2013-01-10 MED ORDER — ENOXAPARIN SODIUM 40 MG/0.4ML ~~LOC~~ SOLN
40.0000 mg | SUBCUTANEOUS | Status: DC
  Administered 2013-01-10 – 2013-01-12 (×3): 40 mg via SUBCUTANEOUS
  Filled 2013-01-10 (×3): qty 0.4

## 2013-01-10 MED ORDER — LINAGLIPTIN 5 MG PO TABS
5.0000 mg | ORAL_TABLET | Freq: Every day | ORAL | Status: DC
  Administered 2013-01-10 – 2013-01-12 (×3): 5 mg via ORAL
  Filled 2013-01-10 (×3): qty 1

## 2013-01-10 MED ORDER — SODIUM CHLORIDE 0.9 % IJ SOLN
3.0000 mL | Freq: Two times a day (BID) | INTRAMUSCULAR | Status: DC
  Administered 2013-01-10 – 2013-01-11 (×4): 3 mL via INTRAVENOUS

## 2013-01-10 MED ORDER — ONDANSETRON HCL 4 MG PO TABS: 4.0000 mg | ORAL_TABLET | Freq: Four times a day (QID) | ORAL | Status: DC | PRN

## 2013-01-10 MED ORDER — ACETAMINOPHEN 650 MG RE SUPP: 650.0000 mg | Freq: Four times a day (QID) | RECTAL | Status: DC | PRN

## 2013-01-10 NOTE — ED Notes (Signed)
Admitting MD at bedside.

## 2013-01-10 NOTE — ED Notes (Signed)
Patient resting on stretcher at this time. Patient denies any complaints. Family member at bedside. Fluids still infusing. No acute distress noted, resp are even and unlabored. Call light on bed. Will continue to monitor.

## 2013-01-10 NOTE — Evaluation (Signed)
Physical Therapy Evaluation Patient Details Name: Dawn [name] MRN: [id number] DOB: May 25, 1951 Today's Date: 01/10/2013 Time: 5621-3086 PT Time Calculation (min): 35 min  PT Assessment / Plan / Recommendation Clinical Impression  62 y.o. female admitted to [facility] for weakness and fever of unknown origin.  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 She would benefit from HHPT f/u at discharge to work on her balance, leg strength and mobility.  It may also be beneficial for her to have a nurologist consult if the admitting physicians agree.      PT Assessment  Patient needs continued PT services    Follow Up Recommendations  Home health PT;Supervision - Intermittent    Does the patient have the potential to tolerate intense rehabilitation     NA  Barriers to Discharge Decreased caregiver support not 24 hour assist    Equipment Recommendations  None recommended by PT    Recommendations for Other Services   none  Frequency Min 3X/week    Precautions / Restrictions Precautions Precautions: Fall Precaution Comments: gait speed and gait pattern put her at risk for recurrent falls.  No h/o falls at home per husband, just slowed down.     Pertinent Vitals/Pain No reports of pain, tachy HR 115-145 during gait.  Pt asymptomatic      Mobility  Bed Mobility Bed Mobility: Supine to Sit;Sitting - Scoot to Edge of Bed Supine to Sit: 3: Mod assist;With rails;HOB elevated Sitting - Scoot to Edge of Bed: 4: Min guard Details for Bed Mobility Assistance: mod assist to support trunk to get to sitting EOB even with HOB maximally elevated she did not have the strength in her trunk or arms to push up to sitting on her own.   Transfers Transfers: Sit to Stand;Stand to [employer] Transfers Sit to Stand: 4: Min assist;With upper extremity assist;From bed Stand to Sit: 4: Min assist;With upper extremity assist;With  armrests;To chair/3-in-1 Stand Pivot Transfers: 4: Min assist;With armrests Details for Transfer Assistance: min assist to stabilize trunk for balance, heavy reliance on hands for balance and stability during transfer.  Upon standing pt had an incontinent urine episode.   Ambulation/Gait Ambulation/Gait Assistance: 4: Min guard Ambulation Distance (Feet): 200 Feet Assistive device: 1 person hand held assist Ambulation/Gait Assistance Details: one person hand held assit with staggering gait pattern and reaching for hallway railing for support.   Gait Pattern: Step-through pattern;Shuffle;Wide base of support (staggering with COG anterior of BOS) Gait velocity: 0.80 ft/sec (<1.8 ft/sec indicates risk for recurrent falls)        PT Diagnosis: Difficulty walking;Abnormality of gait;Generalized weakness;Altered mental status  PT Problem List: Decreased strength;Decreased activity tolerance;Decreased balance;Decreased mobility;Decreased coordination;Decreased cognition;Decreased knowledge of use of DME;Decreased safety awareness PT Treatment Interventions: DME instruction;Gait training;Stair training;Functional mobility training;Therapeutic activities;Therapeutic exercise;Balance training;Neuromuscular re-education;Cognitive remediation;Patient/family education   PT Goals Acute Rehab PT Goals PT Goal Formulation: With patient/family Time For Goal Achievement: 01/24/13 Potential to Achieve Goals: Good Pt will go Supine/Side to Sit: with modified independence;with HOB 0 degrees PT Goal: Supine/Side to Sit - Progress: Goal set today Pt will go Sit to Supine/Side: with modified independence;with HOB 0 degrees PT Goal: Sit to Supine/Side - Progress: Goal set today Pt will go Sit to Stand: with modified independence;with upper extremity assist PT Goal: Sit to Stand - Progress: Goal set today Pt will go Stand to Sit: with modified independence;with upper extremity assist PT Goal: Stand to Sit -  Progress: Goal set today Pt will Ambulate: >150 feet;with modified independence;with least restrictive assistive device PT Goal: Ambulate - Progress: Goal set today Pt will Go Up / Down Stairs: Flight;with modified independence;with rail(s) PT Goal: Up/Down Stairs - Progress: Goal set today  Visit Information  Last PT Received On: 01/10/13 Assistance Needed: +1    Subjective Data  Subjective: 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  Husband came in later and states she is not currently working.  He reports progressive weakness over the past 2-3 months and memory issues.  I reported to him what I found while taking her history (she was not oriented to time).   Patient Stated Goal: to get stronger with PT   Prior Functioning  Home Living Lives With: Spouse;Family (father in law) Available Help at Discharge: Family;Available PRN/intermittently (no one 24 hours ) Type of Home: House Home Access: Stairs to enter [employer] of Steps: 2 Entrance Stairs-Rails: None Home Layout: Two level Alternate Level Stairs-Number of Steps: 14 Alternate Level Stairs-Rails: Left Home Adaptive Equipment: None Additional Comments: husband reports that he works days and his father is there from 12 noon on with his aid (he goes to a senior center in the AM).  So, she would only be home alone for 4 hours.   Prior Function Level of Independence: Independent (needs supervision) Able to Take Stairs?: Yes (with much difficulty per pt husband) Comments: worked as a Lawyer for years.   Communication Communication: No difficulties    Cognition  Cognition Overall Cognitive Status: Impaired Area of Impairment: Memory;Awareness of errors Arousal/Alertness: Awake/alert Orientation Level: Disoriented to;Time Behavior During Session: [facility] for tasks performed Memory Deficits: did not know month, day, could not remember that she was no longer working.  Awareness of Errors: Assistance required to identify  errors made;Assistance required to correct errors made Awareness of Errors - Other Comments: pt was not putting tooth brush directly under water to rinse instead she was rubbing it on the handle used to turn on the water.      Extremity/Trunk Assessment Right Upper Extremity Assessment RUE ROM/Strength/Tone: Deficits RUE ROM/Strength/Tone Deficits: 4/5, unable to use her arms to push up to sitting using the bed rail with HOB maximally elevated.   Left Upper Extremity Assessment LUE ROM/Strength/Tone: Deficits LUE ROM/Strength/Tone Deficits: 4/5, unable to use her arms to push up to sitting using the bed rail with HOB maximally elevated.   Right Lower Extremity Assessment RLE ROM/Strength/Tone: Deficits RLE ROM/Strength/Tone Deficits: grossly 3+-4-/5 per functional assessment  Left Lower Extremity Assessment LLE ROM/Strength/Tone: Deficits LLE ROM/Strength/Tone Deficits: grossly 3+-4-/5 per functional assessment       End of Session PT - End of Session Activity Tolerance: Patient limited by fatigue Patient left: in chair;with call bell/phone within reach;with family/visitor present (husband present at end of session)       Lurena Joiner B. Montanna Mcbain, PT, DPT [phone]   01/10/2013, 4:42 PM

## 2013-01-10 NOTE — ED Notes (Signed)
Patient report called to [facility] for admission. Nurse voiced understanding of report and had no further questions. Patient is in stable condition for transport. Will be going via stretcher with fluids infusing.

## 2013-01-10 NOTE — Progress Notes (Signed)
[facility] PROGRESS NOTE  Dawn [name] AVW:[id number] DOB: January 29, 1951 DOA: 01/09/2013 PCP: Alva Garnet., MD  Assessment/Plan: 1. Fever/ tachycardia/ mild leukocytosis: unclear source of infection. EKG is sinus tachycardia.  UA appears to be contaminated. Urine cultures pending. And blood cultures done and pending. CXR does not show any pneumonia. She is on rocephin empirically.   2. Brief episode of confusion/ generalized weakness and urinary incontinence since yesterday: unclear etiology. Her lactic acid slightly elevated. CK slightly elevated. On exam she is oriented to person place and time. No focal deficits. No neck stiffness or headache. MRI of the brain and lumbar spine ordered for further evaluation. Call neurology as needed.  3. Diabetes Mellitus: resume levemir and SSI. HGBA1C is 8.8 CBG (last 3)   Recent Labs  01/10/13 0742 01/10/13 1207 01/10/13 1701  GLUCAP 167* 192* 194*    4. DVT prophylaxis.   Code Status: full code Family Communication: husband at bedside Disposition Plan: after the MRI is done.    Consultants:  none  Procedures:  MRI brain and Lumbar spine pending.   HPI/Subjective: Comfortable, denies any complaints. Reported she felt weak yesterday No headache.   Objective: Filed Vitals:   01/10/13 1234 01/10/13 1433 01/10/13 1534 01/10/13 1600  BP: 145/99 156/89    Pulse: 129 115 115 145  Temp:  99.9 F (37.7 C)    TempSrc:  Oral    Resp:  18    Height:      Weight:      SpO2: 98% 99%      Intake/Output Summary (Last 24 hours) at 01/10/13 1737 Last data filed at 01/10/13 1722  Gross per 24 hour  Intake 1881.25 ml  Output   1575 ml  Net 306.25 ml   Filed Weights   01/10/13 0204  Weight: 69.491 kg (153 lb 3.2 oz)    Exam: Alert afebrile comfortable. Lungs: clear to ascultation, no wheeze, no crackles, no use of accessory muscles  Cardiovascular: regular rate and rhythm, no regurgitation, no gallops, no murmurs. No  carotid bruits, no JVD  Abdomen: soft, positive BS, non-tender, non-distended, no organomegaly, not an acute abdomen  GU: not examined  Neuro: CN II - XII grossly intact, sensation intact  Musculoskeletal: strength 5/5 all extremities, no clubbing, cyanosis or edema   Data Reviewed: Basic Metabolic Panel:  Recent Labs Lab 01/09/13 2142 01/10/13 0558  NA 135 141  K 3.5 4.0  CL 94* 104  CO2 27 26  GLUCOSE 382* 218*  BUN 17 11  CREATININE 0.93 0.72  CALCIUM 9.3 8.7   Liver Function Tests:  Recent Labs Lab 01/09/13 2142  AST 19  ALT 14  ALKPHOS 76  BILITOT 0.5  PROT 8.2  ALBUMIN 3.9   No results found for this basename: LIPASE, AMYLASE,  in the last 168 hours No results found for this basename: AMMONIA,  in the last 168 hours CBC:  Recent Labs Lab 01/09/13 2053 01/10/13 0558  WBC 12.1* 10.7*  NEUTROABS 10.2*  --   HGB 13.1 11.7*  HCT 36.9 34.3*  MCV 78.3 77.8*  PLT 220 179   Cardiac Enzymes:  Recent Labs Lab 01/10/13 1131  CKTOTAL 208*   BNP (last 3 results) No results found for this basename: PROBNP,  in the last 8760 hours CBG:  Recent Labs Lab 01/10/13 0235 01/10/13 0742 01/10/13 1207 01/10/13 1701  GLUCAP 222* 167* 192* 194*    No results found for this or any previous visit (from the past 240 hour(s)).  Studies: Dg Chest 2 View  01/09/2013  *RADIOLOGY REPORT*  Clinical Data: Altered mental status and fever.  CHEST - 2 VIEW  Comparison: 09/23/2004.  Findings: The cardiac silhouette, mediastinal and hilar contours are stable.  There is tortuosity and ectasia of the thoracic aorta. There are chronic bronchitic type interstitial lung changes but no definite acute overlying pulmonary process.  No pleural effusion. The bony thorax is intact.  Suspect stone filled gallbladder.  IMPRESSION:  1.  Chronic lung changes but no acute pulmonary findings. 2.  Cholelithiasis.   Original Report Authenticated By: Rudie Meyer, M.D.     Scheduled Meds: .  cefTRIAXone (ROCEPHIN)  IV  1 g Intravenous Q24H  . enoxaparin (LOVENOX) injection  40 mg Subcutaneous Q24H  . insulin aspart  0-5 Units Subcutaneous QHS  . insulin aspart  0-9 Units Subcutaneous TID WC  . insulin detemir  18 Units Subcutaneous BID  . linagliptin  5 mg Oral Daily  . lisinopril  5 mg Oral Daily  . sodium chloride  3 mL Intravenous Q12H   Continuous Infusions: . sodium chloride 75 mL/hr at 01/10/13 1222    Active Problems:   Fever   Tachycardia   Diabetes   Hypertension       Dawn [name]  [facility] Pager [phone]. If 7PM-7AM, please contact night-coverage at [email or web address], password [email or web address] 01/10/2013, 5:37 PM  LOS: 1 day

## 2013-01-10 NOTE — Progress Notes (Signed)
Inpatient Diabetes Program Recommendations  AACE/ADA: New Consensus Statement on Inpatient Glycemic Control (2013)  Target Ranges:  Prepandial:   less than 140 mg/dL      Peak postprandial:   less than 180 mg/dL (1-2 hours)      Critically ill patients:  140 - 180 mg/dL    Patient with history of diabetes.  Admitted with fever/tachycardia/HTN.  Inpatient Diabetes Program Recommendations Correction (SSI): Please add Novolog Sensitive correction scale (SSI) tid ac + HS. HgbA1C: Please check current A1c (last one on record from 2010).  Will follow. Ambrose Finland RN, MSN, CDE Diabetes Coordinator Inpatient Diabetes Program [phone]

## 2013-01-10 NOTE — H&P (Signed)
PCP:   Alva Garnet., MD   Chief Complaint:  confusion  HPI: This is a 62 year old female was brought in by her husband. He states she was not feeling well today. When he came home for lunch she states she was too weak she couldn't get up the couch. When he came home from work she had some incontinence, when he asked if she was going to clean this she said yes but did not make a move to do so. He states when he spoke with her she was completely logical however she appear to be having some difficulty following through. He decided to bring her to the ER. In the ER she was found to have a fever and tachycardia.there was no  evidence of an altered mentation. She denies any chills, nausea, vomiting, diarrhea, shortness of breath, cough, myalgia. The patient has received 2 L of IV fluids, her temperatures normalized but she remained tachycardic. The ER physician is asked to observation overnight due to the tachycardia. History provided by both patient and husband.in the ER she was also found to be hypertensive with systolic blood pressure as high as 191/98.   Review of Systems:  The patient denies anorexia, fever, weight loss,, vision loss, decreased hearing, hoarseness, chest pain, syncope, dyspnea on exertion, peripheral edema, balance deficits, hemoptysis, abdominal pain, melena, hematochezia, severe indigestion/heartburn, hematuria, incontinence, genital sores, muscle weakness, suspicious skin lesions, transient blindness, difficulty walking, depression, unusual weight change, abnormal bleeding, enlarged lymph nodes, angioedema, and breast masses.  Past Medical History: Past Medical History  Diagnosis Date  . Diabetes mellitus without complication   . Hypertension    History reviewed. No past surgeries  Medications: Prior to Admission medications   Medication Sig Start Date End Date Taking? Authorizing Provider  insulin detemir (LEVEMIR) 100 UNIT/ML injection Inject 18 Units into the skin  2 (two) times daily.   Yes Historical Provider, MD  metFORMIN (GLUCOPHAGE) 1000 MG tablet Take 1,000 mg by mouth 2 (two) times daily with a meal.   Yes Historical Provider, MD  sitaGLIPtin (JANUVIA) 50 MG tablet Take 50 mg by mouth 2 (two) times daily.   Yes Historical Provider, MD    Allergies:  No Known Allergies  Social History:  reports that she has never smoked. She does not have any smokeless tobacco history on file. She reports that she does not drink alcohol or use illicit drugs.  Family History: Diabetes mellitus, hypertension  Physical Exam: Filed Vitals:   01/09/13 2300 01/09/13 2322 01/10/13 0000 01/10/13 0030  BP: 152/102 152/102 168/102 152/97  Pulse: 129 130 125 124  Temp:  98.9 F (37.2 C)    TempSrc:  Oral    Resp: 16 24 28 23   SpO2: 95% 95% 97% 98%    General:  Alert and oriented times three, well developed and nourished, no acute distress Eyes: PERRLA, pink conjunctiva, no scleral icterus ENT: Moist oral mucosa, neck supple, no thyromegaly Lungs: clear to ascultation, no wheeze, no crackles, no use of accessory muscles Cardiovascular: regular rate and rhythm, no regurgitation, no gallops, no murmurs. No carotid bruits, no JVD Abdomen: soft, positive BS, non-tender, non-distended, no organomegaly, not an acute abdomen GU: not examined Neuro: CN II - XII grossly intact, sensation intact Musculoskeletal: strength 5/5 all extremities, no clubbing, cyanosis or edema Skin: no rash, no subcutaneous crepitation, no decubitus Psych: appropriate patient   Labs on Admission:   Recent Labs  01/09/13 2142  NA 135  K 3.5  CL 94*  CO2  27  GLUCOSE 382*  BUN 17  CREATININE 0.93  CALCIUM 9.3    Recent Labs  01/09/13 2142  AST 19  ALT 14  ALKPHOS 76  BILITOT 0.5  PROT 8.2  ALBUMIN 3.9   No results found for this basename: LIPASE, AMYLASE,  in the last 72 hours  Recent Labs  01/09/13 2053  WBC 12.1*  NEUTROABS 10.2*  HGB 13.1  HCT 36.9  MCV 78.3   PLT 220    Micro Results: Results for YALENA, COLON (MRN [id number]) as of 01/10/2013 01:11  Ref. Range 01/09/2013 21:15  Color, Urine Latest Range: YELLOW  YELLOW  APPearance Latest Range: CLEAR  CLOUDY (A)  Specific Gravity, Urine Latest Range: 1.005-1.030  1.023  pH Latest Range: 5.0-8.0  6.5  Glucose Latest Range: NEGATIVE mg/dL >5638 (A)  Bilirubin Urine Latest Range: NEGATIVE  NEGATIVE  Ketones, ur Latest Range: NEGATIVE mg/dL NEGATIVE  Protein Latest Range: NEGATIVE mg/dL NEGATIVE  Urobilinogen, UA Latest Range: 0.0-1.0 mg/dL 0.2  Nitrite Latest Range: NEGATIVE  NEGATIVE  Leukocytes, UA Latest Range: NEGATIVE  NEGATIVE  Hgb urine dipstick Latest Range: NEGATIVE  SMALL (A)  RBC / HPF Latest Range: <3 RBC/hpf 0-2  Squamous Epithelial / LPF Latest Range: RARE  MANY (A)  Bacteria, UA Latest Range: RARE  MANY (A)  Casts Latest Range: NEGATIVE  HYALINE CASTS (A)     Radiological Exams on Admission: Dg Chest 2 View  01/09/2013  *RADIOLOGY REPORT*  Clinical Data: Altered mental status and fever.  CHEST - 2 VIEW  Comparison: 09/23/2004.  Findings: The cardiac silhouette, mediastinal and hilar contours are stable.  There is tortuosity and ectasia of the thoracic aorta. There are chronic bronchitic type interstitial lung changes but no definite acute overlying pulmonary process.  No pleural effusion. The bony thorax is intact.  Suspect stone filled gallbladder.  IMPRESSION:  1.  Chronic lung changes but no acute pulmonary findings. 2.  Cholelithiasis.   Original Report Authenticated By: Rudie Meyer, M.D.     Assessment/Plan Present on Admission:  Fever Persistent tachycardia Bring in Patient for 23 hour observation. Unclear etiology Urine and blood cultures collected and pending Treat with empiric Rocephin Continue IV fluid hydration Diabetes mellitus ADA diet consistency of insulin Hypertension -poor control Resume home medication and add as needed  Medication   Full  code DVT prophylaxis   Time in:2:30 AM Time out : 1 AM   Shahd Occhipinti 01/10/2013, 1:08 AM

## 2013-01-11 ENCOUNTER — Observation Stay (HOSPITAL_COMMUNITY): Payer: 59

## 2013-01-11 DIAGNOSIS — I10 Essential (primary) hypertension: Secondary | ICD-10-CM

## 2013-01-11 DIAGNOSIS — I6350 Cerebral infarction due to unspecified occlusion or stenosis of unspecified cerebral artery: Principal | ICD-10-CM

## 2013-01-11 DIAGNOSIS — I517 Cardiomegaly: Secondary | ICD-10-CM

## 2013-01-11 DIAGNOSIS — N390 Urinary tract infection, site not specified: Secondary | ICD-10-CM

## 2013-01-11 DIAGNOSIS — I639 Cerebral infarction, unspecified: Secondary | ICD-10-CM

## 2013-01-11 DIAGNOSIS — Z8673 Personal history of transient ischemic attack (TIA), and cerebral infarction without residual deficits: Secondary | ICD-10-CM | POA: Insufficient documentation

## 2013-01-11 DIAGNOSIS — E119 Type 2 diabetes mellitus without complications: Secondary | ICD-10-CM

## 2013-01-11 HISTORY — DX: Cerebral infarction, unspecified: I63.9

## 2013-01-11 LAB — GLUCOSE, CAPILLARY: Glucose-Capillary: 175 mg/dL — ABNORMAL HIGH (ref 70–99)

## 2013-01-11 MED ORDER — SIMVASTATIN 20 MG PO TABS
20.0000 mg | ORAL_TABLET | Freq: Every day | ORAL | Status: DC
  Administered 2013-01-11: 20 mg via ORAL
  Filled 2013-01-11 (×2): qty 1

## 2013-01-11 MED ORDER — ASPIRIN 81 MG PO CHEW
81.0000 mg | CHEWABLE_TABLET | Freq: Every day | ORAL | Status: DC
  Administered 2013-01-11 – 2013-01-12 (×2): 81 mg via ORAL
  Filled 2013-01-11 (×2): qty 1

## 2013-01-11 NOTE — Evaluation (Signed)
Occupational Therapy Evaluation and Discharge Patient Details Name: Dawn [name] MRN: [id number] DOB: 08-Sep-1951 Today's Date: 01/11/2013 Time: 8413-2440 OT Time Calculation (min): 22 min  OT Assessment / Plan / Recommendation Clinical Impression  This 62 yo female admitted with confusion and weakness (per significant other this has progressed ove the last 2-3 months) presents today to acute OT at a S level (will have this intermittently at home--4 hours when someone is not there). No further acute OT needs, but do recommned HHOT.    OT Assessment  Patient needs continued OT Services    Follow Up Recommendations  Home health OT    Barriers to Discharge Decreased caregiver support    Equipment Recommendations  None recommended by OT          Precautions / Restrictions Precautions Precautions: Fall Restrictions Weight Bearing Restrictions: No       ADL  Equipment Used: Gait belt ADL Comments: Pt is at an overall S level for BADLs as this point (per her significant other in the room she is moving better today than he has seen in a long time). Did recommend that they use the shower seat they have at home for safety with pt in the shower and consider putting in a grab bar.    OT Diagnosis: Cognitive deficits  OT Problem List: Decreased cognition     Visit Information  Last OT Received On: 01/11/13 Assistance Needed: +1    Subjective Data  Subjective: Let me figure this out (when asked what month it is)--and she did with increased time (did not use calendar)   Prior Functioning     Home Living Lives With: Significant other;Family Available Help at Discharge: Family;Available PRN/intermittently Type of Home: House Home Access: Stairs to enter [employer] of Steps: 2 Entrance Stairs-Rails: None Home Layout: Two level Alternate Level Stairs-Number of Steps: 14 Alternate Level Stairs-Rails: Left Bathroom Shower/Tub: Tub/shower unit;Curtain Firefighter:  Standard Home Adaptive Equipment: Shower chair with back Additional Comments: husband reports that he works days and his father is there from 12 noon on with his aid (he goes to a senior center in the AM).  So, she would only be home alone for 4 hours.   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 Dominant Hand: Right         Vision/Perception Vision - History Baseline Vision: Wears glasses all the time Patient Visual Report: No change from baseline   Cognition  Cognition Overall Cognitive Status: Impaired Area of Impairment: Memory Arousal/Alertness: Awake/alert Orientation Level: Disoriented to;Time (year and increased time for month) Behavior During Session: WFL for tasks performed    Extremity/Trunk Assessment Right Upper Extremity Assessment RUE ROM/Strength/Tone: Within functional levels RUE ROM/Strength/Tone Deficits: 5/5 with MMT RUE Coordination: WFL - gross/fine motor Left Upper Extremity Assessment LUE ROM/Strength/Tone: Within functional levels LUE ROM/Strength/Tone Deficits: 5/5 with MMT LUE Coordination: WFL - gross/fine motor     Mobility Bed Mobility Details for Bed Mobility Assistance: Pt sitting on EOB upon my arrival Transfers Transfers: Sit to Stand;Stand to Sit Sit to Stand: 5: Supervision;With upper extremity assist;From bed Stand to Sit: 5: Supervision;With upper extremity assist;To bed Details for Transfer Assistance: Sit to stand and stand to sit without any balance issues today, with a normal rate/speed           End of Session OT - End  of Session Equipment Utilized During Treatment: Gait belt Activity Tolerance: Patient tolerated treatment well Patient left: in bed;with family/visitor present (sitting EOB)       Evette Georges  [phone] 01/11/2013, 1:00 PM

## 2013-01-11 NOTE — Consult Note (Signed)
Referring Physician: Dr. Vassie Loll.    Chief Complaint: stroke  HPI:                                                                                                                                         Dawn [name] is an 62 y.o. female with a past medical history significant for hypertension, DM, admitted to [facility] on 3/24 due to confusion, decreased responsiveness, no feeling well. She recalls that she was kind of confused and her husband became concerned because she was less responsive than usual. However, she denies headache, vertigo, double vision, difficulty swallowing or speaking, slurred speech, language or vision impairment.  The initial work up in the hospital was not particularly impressive but MRI-DWI completed today revealed a small subacute appearing white matter infarcts in the right corona radiata. 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 Posterior circulation is okay. Dawn [name] reports gait difficulty that she described as " slow walking" over the past 2-3 months. In addition, relates mild forgetfulness.No bladder incontinence.   Date last known well: unknown. Time last known well: uncertain. tPA Given: no, late presentation.  Past Medical History  Diagnosis Date  . Diabetes mellitus without complication   . Hypertension     Past Surgical History  Procedure Laterality Date  . No past surgeries      History reviewed. No pertinent family history. Social History:  reports that she has never smoked. She has never used smokeless tobacco. She reports that she does not drink alcohol or use illicit drugs.  Allergies: No Known Allergies  Medications:                                                                                                                           I have reviewed the patient's current medications.  ROS:  History obtained from the patient and chart review.  General ROS: negative for - chills, fatigue, fever, night sweats, weight gain or weight loss Psychological ROS: negative for - behavioral disorder, hallucinations, mood swings or suicidal ideation Ophthalmic ROS: negative for - blurry vision, double vision, eye pain or loss of vision ENT ROS: negative for - epistaxis, nasal discharge, oral lesions, sore throat, tinnitus or vertigo Allergy and Immunology ROS: negative for - hives or itchy/watery eyes Hematological and Lymphatic ROS: negative for - bleeding problems, bruising or swollen lymph nodes Endocrine ROS: negative for - galactorrhea, hair pattern changes, polydipsia/polyuria or temperature intolerance Respiratory ROS: negative for - cough, hemoptysis, shortness of breath or wheezing Cardiovascular ROS: negative for - chest pain, dyspnea on exertion, edema or irregular heartbeat Gastrointestinal ROS: negative for - abdominal pain, diarrhea, hematemesis, nausea/vomiting or stool incontinence Genito-Urinary ROS: negative for - dysuria, hematuria, incontinence or urinary frequency/urgency Musculoskeletal ROS: negative for - joint swelling or muscular weakness Neurological ROS: as noted in HPI Dermatological ROS: negative for rash and skin lesion changes     Physical exam: pleasant female in no apparent distress. Blood pressure 120/67, pulse 99, temperature 98 F (36.7 C), temperature source Oral, resp. rate 18, height 5\' 2"  (1.575 m), weight 69.491 kg (153 lb 3.2 oz), SpO2 98.00%.  Head: normocephalic. Neck: supple, no bruits, no JVD. Cardiac: no murmurs. Lungs: clear. Abdomen: soft, no tender, no mass. Extremities: no edema.  Neurologic Examination:                                                                                                      Mental Status: Alert,  oriented, thought content appropriate.  Speech fluent without evidence of aphasia.  Able to follow 3 step commands without difficulty. Cranial Nerves: II: Discs flat bilaterally; Visual fields grossly normal, pupils equal, round, reactive to light and accommodation III,IV, VI: ptosis not present, extra-ocular motions intact bilaterally V,VII: smile symmetric, facial light touch sensation normal bilaterally VIII: hearing normal bilaterally IX,X: gag reflex present XI: bilateral shoulder shrug XII: midline tongue extension Motor: Right : Upper extremity   5/5    Left:     Upper extremity   5/5  Lower extremity   5/5     Lower extremity   5/5 Tone and bulk:normal tone throughout; no atrophy noted Sensory: Pinprick and light touch intact throughout, bilaterally Deep Tendon Reflexes: 2+ and symmetric throughout Plantars: Right: downgoing   Left: downgoing Cerebellar: normal finger-to-nose,  normal heel-to-shin test Gait: no ataxia. CV: pulses palpable throughout      Results for orders placed during the hospital encounter of 01/09/13 (from the past 48 hour(s))  CBC WITH DIFFERENTIAL     Status: Abnormal   Collection Time    01/09/13  8:53 PM      Result Value Range   WBC 12.1 (*) 4.0 - 10.5 K/uL   RBC 4.71  3.87 - 5.11 MIL/uL   Hemoglobin 13.1  12.0 - 15.0 g/dL   HCT 45.4  09.8 - 11.9 %   MCV 78.3  78.0 - 100.0 fL  MCH 27.8  26.0 - 34.0 pg   MCHC 35.5  30.0 - 36.0 g/dL   RDW 16.1  09.6 - 04.5 %   Platelets 220  150 - 400 K/uL   Neutrophils Relative 85 (*) 43 - 77 %   Neutro Abs 10.2 (*) 1.7 - 7.7 K/uL   Lymphocytes Relative 11 (*) 12 - 46 %   Lymphs Abs 1.3  0.7 - 4.0 K/uL   Monocytes Relative 4  3 - 12 %   Monocytes Absolute 0.5  0.1 - 1.0 K/uL   Eosinophils Relative 0  0 - 5 %   Eosinophils Absolute 0.0  0.0 - 0.7 K/uL   Basophils Relative 0  0 - 1 %   Basophils Absolute 0.0  0.0 - 0.1 K/uL  URINALYSIS, ROUTINE W REFLEX MICROSCOPIC     Status: Abnormal   Collection  Time    01/09/13  9:15 PM      Result Value Range   Color, Urine YELLOW  YELLOW   APPearance CLOUDY (*) CLEAR   Specific Gravity, Urine 1.023  1.005 - 1.030   pH 6.5  5.0 - 8.0   Glucose, UA >1000 (*) NEGATIVE mg/dL   Hgb urine dipstick SMALL (*) NEGATIVE   Bilirubin Urine NEGATIVE  NEGATIVE   Ketones, ur NEGATIVE  NEGATIVE mg/dL   Protein, ur NEGATIVE  NEGATIVE mg/dL   Urobilinogen, UA 0.2  0.0 - 1.0 mg/dL   Nitrite NEGATIVE  NEGATIVE   Leukocytes, UA NEGATIVE  NEGATIVE  URINE MICROSCOPIC-ADD ON     Status: Abnormal   Collection Time    01/09/13  9:15 PM      Result Value Range   Squamous Epithelial / LPF MANY (*) RARE   RBC / HPF 0-2  <3 RBC/hpf   Bacteria, UA MANY (*) RARE   Casts HYALINE CASTS (*) NEGATIVE  URINE CULTURE     Status: None   Collection Time    01/09/13  9:15 PM      Result Value Range   Specimen Description URINE, CLEAN CATCH     Special Requests ADD [id number]     Culture  Setup Time 01/10/2013 15:47     Colony Count PENDING     Culture Culture reincubated for better growth     Report Status PENDING    COMPREHENSIVE METABOLIC PANEL     Status: Abnormal   Collection Time    01/09/13  9:42 PM      Result Value Range   Sodium 135  135 - 145 mEq/L   Potassium 3.5  3.5 - 5.1 mEq/L   Chloride 94 (*) 96 - 112 mEq/L   CO2 27  19 - 32 mEq/L   Glucose, Bld 382 (*) 70 - 99 mg/dL   BUN 17  6 - 23 mg/dL   Creatinine, Ser 9.14  0.50 - 1.10 mg/dL   Calcium 9.3  8.4 - 78.2 mg/dL   Total Protein 8.2  6.0 - 8.3 g/dL   Albumin 3.9  3.5 - 5.2 g/dL   AST 19  0 - 37 U/L   ALT 14  0 - 35 U/L   Alkaline Phosphatase 76  39 - 117 U/L   Total Bilirubin 0.5  0.3 - 1.2 mg/dL   GFR calc non Af Amer 66 (*) >90 mL/min   GFR calc Af Amer 76 (*) >90 mL/min   Comment:            The eGFR has been calculated  using the CKD EPI equation.     This calculation has not been     validated in all clinical     situations.     eGFR's persistently     <90 mL/min signify      possible Chronic Kidney Disease.  CULTURE, BLOOD (ROUTINE X 2)     Status: None   Collection Time    01/09/13  9:50 PM      Result Value Range   Specimen Description BLOOD LEFT ARM     Special Requests BOTTLES DRAWN AEROBIC AND ANAEROBIC 10CC     Culture  Setup Time 01/10/2013 08:58     Culture       Value:        BLOOD CULTURE RECEIVED NO GROWTH TO DATE CULTURE WILL BE HELD FOR 5 DAYS BEFORE ISSUING A FINAL NEGATIVE REPORT   Report Status PENDING    CULTURE, BLOOD (ROUTINE X 2)     Status: None   Collection Time    01/09/13 10:00 PM      Result Value Range   Specimen Description BLOOD LEFT HAND     Special Requests BOTTLES DRAWN AEROBIC AND ANAEROBIC 10CC     Culture  Setup Time 01/10/2013 08:58     Culture       Value:        BLOOD CULTURE RECEIVED NO GROWTH TO DATE CULTURE WILL BE HELD FOR 5 DAYS BEFORE ISSUING A FINAL NEGATIVE REPORT   Report Status PENDING    CG4 I-STAT (LACTIC ACID)     Status: Abnormal   Collection Time    01/09/13 10:12 PM      Result Value Range   Lactic Acid, Venous 2.82 (*) 0.5 - 2.2 mmol/L  GLUCOSE, CAPILLARY     Status: Abnormal   Collection Time    01/10/13  2:35 AM      Result Value Range   Glucose-Capillary 222 (*) 70 - 99 mg/dL  BASIC METABOLIC PANEL     Status: Abnormal   Collection Time    01/10/13  5:58 AM      Result Value Range   Sodium 141  135 - 145 mEq/L   Potassium 4.0  3.5 - 5.1 mEq/L   Chloride 104  96 - 112 mEq/L   CO2 26  19 - 32 mEq/L   Glucose, Bld 218 (*) 70 - 99 mg/dL   BUN 11  6 - 23 mg/dL   Creatinine, Ser 0.98  0.50 - 1.10 mg/dL   Calcium 8.7  8.4 - 11.9 mg/dL   GFR calc non Af Amer >90  >90 mL/min   GFR calc Af Amer >90  >90 mL/min   Comment:            The eGFR has been calculated     using the CKD EPI equation.     This calculation has not been     validated in all clinical     situations.     eGFR's persistently     <90 mL/min signify     possible Chronic Kidney Disease.  CBC     Status: Abnormal    Collection Time    01/10/13  5:58 AM      Result Value Range   WBC 10.7 (*) 4.0 - 10.5 K/uL   RBC 4.41  3.87 - 5.11 MIL/uL   Hemoglobin 11.7 (*) 12.0 - 15.0 g/dL   HCT 14.7 (*) 82.9 - 56.2 %   MCV 77.8 (*)  78.0 - 100.0 fL   MCH 26.5  26.0 - 34.0 pg   MCHC 34.1  30.0 - 36.0 g/dL   RDW 56.2  13.0 - 86.5 %   Platelets 179  150 - 400 K/uL  GLUCOSE, CAPILLARY     Status: Abnormal   Collection Time    01/10/13  7:42 AM      Result Value Range   Glucose-Capillary 167 (*) 70 - 99 mg/dL   Comment 1 Documented in Chart     Comment 2 Notify RN    LACTIC ACID, PLASMA     Status: Abnormal   Collection Time    01/10/13  9:01 AM      Result Value Range   Lactic Acid, Venous 2.3 (*) 0.5 - 2.2 mmol/L  CK     Status: Abnormal   Collection Time    01/10/13 11:31 AM      Result Value Range   Total CK 208 (*) 7 - 177 U/L  HEMOGLOBIN A1C     Status: Abnormal   Collection Time    01/10/13 11:31 AM      Result Value Range   Hemoglobin A1C 8.8 (*) <5.7 %   Comment: (NOTE)                                                                               According to the ADA Clinical Practice Recommendations for 2011, when     HbA1c is used as a screening test:      >=6.5%   Diagnostic of Diabetes Mellitus               (if abnormal result is confirmed)     5.7-6.4%   Increased risk of developing Diabetes Mellitus     References:Diagnosis and Classification of Diabetes Mellitus,Diabetes     Care,2011,34(Suppl 1):S62-S69 and Standards of Medical Care in             Diabetes - 2011,Diabetes Care,2011,34 (Suppl 1):S11-S61.   Mean Plasma Glucose 206 (*) <117 mg/dL  GLUCOSE, CAPILLARY     Status: Abnormal   Collection Time    01/10/13 12:07 PM      Result Value Range   Glucose-Capillary 192 (*) 70 - 99 mg/dL   Comment 1 Documented in Chart     Comment 2 Notify RN    TSH     Status: Abnormal   Collection Time    01/10/13  4:44 PM      Result Value Range   TSH 0.282 (*) 0.350 - 4.500 uIU/mL   GLUCOSE, CAPILLARY     Status: Abnormal   Collection Time    01/10/13  5:01 PM      Result Value Range   Glucose-Capillary 194 (*) 70 - 99 mg/dL   Comment 1 Documented in Chart     Comment 2 Notify RN    GLUCOSE, CAPILLARY     Status: Abnormal   Collection Time    01/10/13  8:49 PM      Result Value Range   Glucose-Capillary 179 (*) 70 - 99 mg/dL  GLUCOSE, CAPILLARY     Status: Abnormal   Collection Time    01/10/13  9:39 PM      Result Value Range   Glucose-Capillary 174 (*) 70 - 99 mg/dL  GLUCOSE, CAPILLARY     Status: Abnormal   Collection Time    01/11/13  7:31 AM      Result Value Range   Glucose-Capillary 112 (*) 70 - 99 mg/dL   Comment 1 Notify RN    GLUCOSE, CAPILLARY     Status: Abnormal   Collection Time    01/11/13 12:14 PM      Result Value Range   Glucose-Capillary 229 (*) 70 - 99 mg/dL   Comment 1 Notify RN    GLUCOSE, CAPILLARY     Status: Abnormal   Collection Time    01/11/13  4:57 PM      Result Value Range   Glucose-Capillary 171 (*) 70 - 99 mg/dL   Comment 1 Notify RN     Dg Chest 2 View  01/09/2013  *RADIOLOGY REPORT*  Clinical Data: Altered mental status and fever.  CHEST - 2 VIEW  Comparison: 09/23/2004.  Findings: The cardiac silhouette, mediastinal and hilar contours are stable.  There is tortuosity and ectasia of the thoracic aorta. There are chronic bronchitic type interstitial lung changes but no definite acute overlying pulmonary process.  No pleural effusion. The bony thorax is intact.  Suspect stone filled gallbladder.  IMPRESSION:  1.  Chronic lung changes but no acute pulmonary findings. 2.  Cholelithiasis.   Original Report Authenticated By: Rudie Meyer, M.D.    Mr Maxine Glenn Head Wo Contrast  01/11/2013  *RADIOLOGY REPORT*  Clinical Data:  62 year old female with acute onset weakness and altered mental status.  Comparison: Brain MRI and MRA 08/13/2009.  MRI HEAD WITHOUT CONTRAST  Technique: Multiplanar, multiecho pulse sequences of the brain and  surrounding structures were obtained according to standard protocol without intravenous contrast.  Findings: Chronic ventriculomegaly does not appear significantly changed since 2007.  There is evidence of hyperdynamic flow in the cerebral aqueduct on axial and coronal T2-weighted images (but not evident on sagittal T1).  There is also chronic periventricular T2 and FLAIR hyperintensity which could reflect transependymal edema. However, the temporal horns have been relatively diminutive on all studies.  There is a small patchy area of increased trace diffusion signal in the right corona radiata (series 5 images 21, 22).  There is associated T2 and FLAIR hyperintensity.  Signal here on the ADC map is fairly isointense.  No mass effect.  No other diffusion abnormality. No acute intracranial hemorrhage identified.  Major intracranial vascular flow voids are preserved.  No midline shift, mass effect, or evidence of mass lesion. Negative pituitary, cervicomedullary junction and visualized cervical spine.  Normal bone marrow signal. Small focus of T2 hyperintensity in the dorsal right thalamus suggests a chronic lacunar infarct which is new since 2010.  Other gray and white matter signal outside of the right corona radiata has not significantly changed since 2010.  Stable orbit soft tissues. Visualized paranasal sinuses and mastoids are clear.  Chronic small volume fluid in the right anterior mastoid air cells.  Negative scalp soft tissues.  Small cystic area in the left maxilla alveolar process appears to be benign and dental related.  IMPRESSION: 1.  Small subacute appearing white matter infarcts in the right corona radiata out a.  No mass effect or hemorrhage. 2.  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 3.  Chronic ventriculomegaly, with suspected hyperdynamic flow in the cerebral aqueduct and a low level of transependymal edema.  This might reflect chronic or congenital communicating hydrocephalus, and  in the appropriate clinical setting normal pressure hydrocephalus could not be excluded. 4.  See MRA findings below.  MRA HEAD WITHOUT CONTRAST  Technique: Angiographic images of the Circle of Willis were obtained using MRA technique without  intravenous contrast.  Findings: Stable antegrade flow in the posterior circulation. Codominant distal vertebral arteries.  Normal PICA vessels.  Patent vertebrobasilar junction.  AICA origins are patent.  No basilar stenosis.  SCA and right PCA origins are normal.  Fetal left PCA origin.  Right posterior communicating artery also is present. Bilateral PCA branches are within normal limits.  Antegrade flow in both ICA siphons.  There is chronic irregularity and stenosis of the right ICA at the skull base.  The eccentric flow signal is such that a chronic dissection cannot be excluded. The degree of stenosis is approximately 50-55 % with respect to the distal vessel. The appearance has not significantly changed since 2010, and just caudal to this level there is chronic tortuosity of the cervical right ICA.  Cavernous ICA irregularity greater on the right has progressed. There is moderate stenosis near the right ICA anterior genu.  The right ophthalmic artery origin now is more difficult to visualize. The left ophthalmic and both posterior communicating artery origins are within normal limits.  Stable carotid termini.  Dominant left ACA A1 segment again noted. Left ACA and MCA origins are stable within normal limits.  Normal anterior communicating artery.  Visualized ACA branches are stable within normal limits.  Left MCA branches are stable and within normal limits.  Right MCA branches are stable within normal limits.  IMPRESSION: 1.  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  There is moderate right ICA stenosis in the anterior genu. 2.  Chronic irregularity and 50 - 55% stenosis of the right ICA at the skull base.  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  The appearance is stable since 2010. 3.  Posterior circulation and other anterior circulation vessels are stable and within normal limits.   Original Report Authenticated By: Erskine Speed, M.D.    Mr Brain Wo Contrast  01/11/2013  *RADIOLOGY REPORT*  Clinical Data:  62 year old female with acute onset weakness and altered mental status.  Comparison: Brain MRI and MRA 08/13/2009.  MRI HEAD WITHOUT CONTRAST  Technique: Multiplanar, multiecho pulse sequences of the brain and surrounding structures were obtained according to standard protocol without intravenous contrast.  Findings: Chronic ventriculomegaly does not appear significantly changed since 2007.  There is evidence of hyperdynamic flow in the cerebral aqueduct on axial and coronal T2-weighted images (but not evident on sagittal T1).  There is also chronic periventricular T2 and FLAIR hyperintensity which could reflect transependymal edema. However, the temporal horns have been relatively diminutive on all studies.  There is a small patchy area of increased trace diffusion signal in the right corona radiata (series 5 images 21, 22).  There is associated T2 and FLAIR hyperintensity.  Signal here on the ADC map is fairly isointense.  No mass effect.  No other diffusion abnormality. No acute intracranial hemorrhage identified.  Major intracranial vascular flow voids are preserved.  No midline shift, mass effect, or evidence of mass lesion. Negative pituitary, cervicomedullary junction and visualized cervical spine.  Normal bone marrow signal. Small focus of T2 hyperintensity in the dorsal right thalamus suggests a chronic lacunar infarct which is new since 2010.  Other gray and white matter signal outside of the right  corona radiata has not significantly changed since 2010.  Stable orbit soft tissues. Visualized paranasal sinuses and mastoids are clear.  Chronic small volume fluid in the right anterior mastoid air cells.  Negative scalp  soft tissues.  Small cystic area in the left maxilla alveolar process appears to be benign and dental related.  IMPRESSION: 1.  Small subacute appearing white matter infarcts in the right corona radiata out a.  No mass effect or hemorrhage. 2.  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 3.  Chronic ventriculomegaly, with suspected hyperdynamic flow in the cerebral aqueduct and a low level of transependymal edema. This might reflect chronic or congenital communicating hydrocephalus, and in the appropriate clinical setting normal pressure hydrocephalus could not be excluded. 4.  See MRA findings below.  MRA HEAD WITHOUT CONTRAST  Technique: Angiographic images of the Circle of Willis were obtained using MRA technique without  intravenous contrast.  Findings: Stable antegrade flow in the posterior circulation. Codominant distal vertebral arteries.  Normal PICA vessels.  Patent vertebrobasilar junction.  AICA origins are patent.  No basilar stenosis.  SCA and right PCA origins are normal.  Fetal left PCA origin.  Right posterior communicating artery also is present. Bilateral PCA branches are within normal limits.  Antegrade flow in both ICA siphons.  There is chronic irregularity and stenosis of the right ICA at the skull base.  The eccentric flow signal is such that a chronic dissection cannot be excluded. The degree of stenosis is approximately 50-55 % with respect to the distal vessel. The appearance has not significantly changed since 2010, and just caudal to this level there is chronic tortuosity of the cervical right ICA.  Cavernous ICA irregularity greater on the right has progressed. There is moderate stenosis near the right ICA anterior genu.  The right ophthalmic artery origin now is more difficult to visualize. The left ophthalmic and both posterior communicating artery origins are within normal limits.  Stable carotid termini.  Dominant left ACA A1 segment again noted. Left ACA and MCA origins  are stable within normal limits.  Normal anterior communicating artery.  Visualized ACA branches are stable within normal limits.  Left MCA branches are stable and within normal limits.  Right MCA branches are stable within normal limits.  IMPRESSION: 1.  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  There is moderate right ICA stenosis in the anterior genu. 2.  Chronic irregularity and 50 - 55% stenosis of the right ICA at the skull base.  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  The appearance is stable since 2010. 3.  Posterior circulation and other anterior circulation vessels are stable and within normal limits.   Original Report Authenticated By: Erskine Speed, M.D.    Mr Lumbar Spine Wo Contrast  01/11/2013  *RADIOLOGY REPORT*  Clinical Data: Bilateral lower extremity weakness.  Urinary incontinence.   Fever.  MRI LUMBAR SPINE WITHOUT CONTRAST  Technique:  Multiplanar and multiecho pulse sequences of the lumbar spine were obtained without intravenous contrast.  Comparison: None.  Findings: Normal conus tip at L1.  Normal paraspinal soft tissues.  T10-11 through L2-3:  Normal.  L3-4:  4 mm spondylolisthesis due to moderately severe bilateral facet arthritis.  Disc space narrowing with broad-based disc bulge as well as a disc protrusion into the right neural foramen compressing the right L3 nerve.  The thecal sac is severely compressed particularly in the right lateral recess.  This should affect the right L4 nerve and  possibly the left L4 nerve.  L4-5:  Prominent central disc protrusion and extrusion extending superiorly behind the body of L4 in the midline compressing the ventral aspect of the thecal sac.  The protrusion at the level of the disc space compresses the thecal sac and the left L5 nerve in the lateral recess.   No foraminal stenosis.  L5-S1:  Normal.  IMPRESSION:  1.  Severe compression of the thecal sac at L3-4 due to a combination of a 4 mm spondylolisthesis  and broad-based disc bulge as well as a protrusion into the right neural foramen compressing the right L3 nerve. 2. Compression of the left lateral recess and the left L5 nerve by a central disc protrusion and extrusion at L4-5.   Original Report Authenticated By: Francene Boyers, M.D.       Assessment: 62 y.o. female with small right subacute appearing white matter infarcts in the right corona radiata, most likely resulting from small vessel disease in the context of long standing HTN and DM. Started on aspirin. She has no lateralizing neurological findings on neuro-exam. Complete stroke work up. Stroke team to resume care in the morning.  Stroke Risk Factors - HTN, DM.  Plan: 1. HgbA1c, fasting lipid panel 2. MRI, MRA  of the brain without contrast 3. PT consult, OT consult, Speech consult 4. Echocardiogram 5. Carotid dopplers 6. Prophylactic therapy-Antiplatelet med: Aspirin - dose 81 mg daily. 7. Risk factor modification 8. Telemetry monitoring 9. Frequent neuro checks  Wyatt Portela, MD [facility] [facility]hospitalist [phone]  01/11/2013, 5:50 PM

## 2013-01-11 NOTE — Progress Notes (Signed)
Bilateral:  No evidence of hemodynamically significant internal carotid artery stenosis.   Vertebral artery flow is antegrade.     

## 2013-01-11 NOTE — Progress Notes (Signed)
[facility] PROGRESS NOTE  Dawn [name] ZOX:[id number] DOB: February 12, 1951 DOA: 01/09/2013 PCP: Alva Garnet., MD  Assessment/Plan: 1. Fever/ tachycardia/ mild leukocytosis; presumed to be due to UTI: will continue rocephin. Patient better and with just low grade temp this morning.  2. Brief episode of confusion/ generalized weakness and urinary incontinence since yesterday: secondary to subacute stroke. Treatment as mentioned below. ASA for secondary prevention.  3. Diabetes Mellitus: resume levemir and SSI. HGBA1C is 8.8; will continue SSI, levemir and tradjenta.  4.   Subacute right corona radiata stroke: will complete work up for stroke; ASA for secondary prevention. Risk factors modification.  5.   HLD: will check lipid panel and if abnormal start statins.  DVT : lovenox.   Code Status: full code Family Communication: husband at bedside Disposition Plan: follow PT/OT for disposition after stroke work up completed.   Consultants:  none  Procedures:  MRI brain (positive for subacute right corona radiata infarcts   2-d echo and carotid dopplers pending   Lumbar MRI 1. Severe compression of the thecal sac at L3-4 due to a combination of a 4 mm spondylolisthesis and broad-based disc bulge as well as a protrusion into the right neural foramen compressing the right L3 nerve. 2. Compression of the left lateral recess and the left L5 nerve by a central disc protrusion and extrusion at L4-5.   HPI/Subjective: Afebrile, NAD, no CP, no SOB. AAOX3; mild low grade temp early this morning.   Objective: Filed Vitals:   01/10/13 2100 01/11/13 0500 01/11/13 0800 01/11/13 1400  BP: 173/90 122/77  120/67  Pulse: 117 107  99  Temp: 100.4 F (38 C) 98.9 F (37.2 C) 98.8 F (37.1 C) 98 F (36.7 C)  TempSrc:    Oral  Resp: 18 18  18   Height:      Weight:      SpO2: 97% 98%  98%    Intake/Output Summary (Last 24 hours) at 01/11/13 1910 Last data filed at 01/11/13 1700   Gross per 24 hour  Intake   1320 ml  Output      0 ml  Net   1320 ml   Filed Weights   01/10/13 0204  Weight: 69.491 kg (153 lb 3.2 oz)    Exam: Alert, awake and oriented X 3, afebrile and comfortable. Lungs: clear to ascultation, no wheezing, no crackles, no use of accessory muscles  Cardiovascular: regular rate and rhythm, no regurgitation, no gallops, no murmurs. No carotid bruits, no JVD  Abdomen: soft, positive BS, non-tender, non-distended, no organomegaly, not an acute abdomen  GU: not examined  Neuro: CN II - XII grossly intact, sensation intact and MS 5/5   Data Reviewed: Basic Metabolic Panel:  Recent Labs Lab 01/09/13 2142 01/10/13 0558  NA 135 141  K 3.5 4.0  CL 94* 104  CO2 27 26  GLUCOSE 382* 218*  BUN 17 11  CREATININE 0.93 0.72  CALCIUM 9.3 8.7   Liver Function Tests:  Recent Labs Lab 01/09/13 2142  AST 19  ALT 14  ALKPHOS 76  BILITOT 0.5  PROT 8.2  ALBUMIN 3.9   CBC:  Recent Labs Lab 01/09/13 2053 01/10/13 0558  WBC 12.1* 10.7*  NEUTROABS 10.2*  --   HGB 13.1 11.7*  HCT 36.9 34.3*  MCV 78.3 77.8*  PLT 220 179   Cardiac Enzymes:  Recent Labs Lab 01/10/13 1131  CKTOTAL 208*   CBG:  Recent Labs Lab 01/10/13 2049 01/10/13 2139 01/11/13 0731 01/11/13 1214 01/11/13  1657  GLUCAP 179* 174* 112* 229* 171*    Recent Results (from the past 240 hour(s))  URINE CULTURE     Status: None   Collection Time    01/09/13  9:15 PM      Result Value Range Status   Specimen Description URINE, CLEAN CATCH   Final   Special Requests ADD [id number]   Final   Culture  Setup Time 01/10/2013 15:47   Final   Colony Count PENDING   Incomplete   Culture Culture reincubated for better growth   Final   Report Status PENDING   Incomplete  CULTURE, BLOOD (ROUTINE X 2)     Status: None   Collection Time    01/09/13  9:50 PM      Result Value Range Status   Specimen Description BLOOD LEFT ARM   Final   Special Requests BOTTLES DRAWN AEROBIC  AND ANAEROBIC 10CC   Final   Culture  Setup Time 01/10/2013 08:58   Final   Culture     Final   Value:        BLOOD CULTURE RECEIVED NO GROWTH TO DATE CULTURE WILL BE HELD FOR 5 DAYS BEFORE ISSUING A FINAL NEGATIVE REPORT   Report Status PENDING   Incomplete  CULTURE, BLOOD (ROUTINE X 2)     Status: None   Collection Time    01/09/13 10:00 PM      Result Value Range Status   Specimen Description BLOOD LEFT HAND   Final   Special Requests BOTTLES DRAWN AEROBIC AND ANAEROBIC 10CC   Final   Culture  Setup Time 01/10/2013 08:58   Final   Culture     Final   Value:        BLOOD CULTURE RECEIVED NO GROWTH TO DATE CULTURE WILL BE HELD FOR 5 DAYS BEFORE ISSUING A FINAL NEGATIVE REPORT   Report Status PENDING   Incomplete     Studies: Dg Chest 2 View  01/09/2013  *RADIOLOGY REPORT*  Clinical Data: Altered mental status and fever.  CHEST - 2 VIEW  Comparison: 09/23/2004.  Findings: The cardiac silhouette, mediastinal and hilar contours are stable.  There is tortuosity and ectasia of the thoracic aorta. There are chronic bronchitic type interstitial lung changes but no definite acute overlying pulmonary process.  No pleural effusion. The bony thorax is intact.  Suspect stone filled gallbladder.  IMPRESSION:  1.  Chronic lung changes but no acute pulmonary findings. 2.  Cholelithiasis.   Original Report Authenticated By: Rudie Meyer, M.D.    Mr Maxine Glenn Head Wo Contrast  01/11/2013  *RADIOLOGY REPORT*  Clinical Data:  62 year old female with acute onset weakness and altered mental status.  Comparison: Brain MRI and MRA 08/13/2009.  MRI HEAD WITHOUT CONTRAST  Technique: Multiplanar, multiecho pulse sequences of the brain and surrounding structures were obtained according to standard protocol without intravenous contrast.  Findings: Chronic ventriculomegaly does not appear significantly changed since 2007.  There is evidence of hyperdynamic flow in the cerebral aqueduct on axial and coronal T2-weighted images  (but not evident on sagittal T1).  There is also chronic periventricular T2 and FLAIR hyperintensity which could reflect transependymal edema. However, the temporal horns have been relatively diminutive on all studies.  There is a small patchy area of increased trace diffusion signal in the right corona radiata (series 5 images 21, 22).  There is associated T2 and FLAIR hyperintensity.  Signal here on the ADC map is fairly isointense.  No mass effect.  No other diffusion abnormality. No acute intracranial hemorrhage identified.  Major intracranial vascular flow voids are preserved.  No midline shift, mass effect, or evidence of mass lesion. Negative pituitary, cervicomedullary junction and visualized cervical spine.  Normal bone marrow signal. Small focus of T2 hyperintensity in the dorsal right thalamus suggests a chronic lacunar infarct which is new since 2010.  Other gray and white matter signal outside of the right corona radiata has not significantly changed since 2010.  Stable orbit soft tissues. Visualized paranasal sinuses and mastoids are clear.  Chronic small volume fluid in the right anterior mastoid air cells.  Negative scalp soft tissues.  Small cystic area in the left maxilla alveolar process appears to be benign and dental related.  IMPRESSION: 1.  Small subacute appearing white matter infarcts in the right corona radiata out a.  No mass effect or hemorrhage. 2.  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 3.  Chronic ventriculomegaly, with suspected hyperdynamic flow in the cerebral aqueduct and a low level of transependymal edema. This might reflect chronic or congenital communicating hydrocephalus, and in the appropriate clinical setting normal pressure hydrocephalus could not be excluded. 4.  See MRA findings below.  MRA HEAD WITHOUT CONTRAST  Technique: Angiographic images of the Circle of Willis were obtained using MRA technique without  intravenous contrast.  Findings: Stable  antegrade flow in the posterior circulation. Codominant distal vertebral arteries.  Normal PICA vessels.  Patent vertebrobasilar junction.  AICA origins are patent.  No basilar stenosis.  SCA and right PCA origins are normal.  Fetal left PCA origin.  Right posterior communicating artery also is present. Bilateral PCA branches are within normal limits.  Antegrade flow in both ICA siphons.  There is chronic irregularity and stenosis of the right ICA at the skull base.  The eccentric flow signal is such that a chronic dissection cannot be excluded. The degree of stenosis is approximately 50-55 % with respect to the distal vessel. The appearance has not significantly changed since 2010, and just caudal to this level there is chronic tortuosity of the cervical right ICA.  Cavernous ICA irregularity greater on the right has progressed. There is moderate stenosis near the right ICA anterior genu.  The right ophthalmic artery origin now is more difficult to visualize. The left ophthalmic and both posterior communicating artery origins are within normal limits.  Stable carotid termini.  Dominant left ACA A1 segment again noted. Left ACA and MCA origins are stable within normal limits.  Normal anterior communicating artery.  Visualized ACA branches are stable within normal limits.  Left MCA branches are stable and within normal limits.  Right MCA branches are stable within normal limits.  IMPRESSION: 1.  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  There is moderate right ICA stenosis in the anterior genu. 2.  Chronic irregularity and 50 - 55% stenosis of the right ICA at the skull base.  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  The appearance is stable since 2010. 3.  Posterior circulation and other anterior circulation vessels are stable and within normal limits.   Original Report Authenticated By: Erskine Speed, M.D.    Mr Brain Wo Contrast  01/11/2013  *RADIOLOGY REPORT*  Clinical  Data:  62 year old female with acute onset weakness and altered mental status.  Comparison: Brain MRI and MRA 08/13/2009.  MRI HEAD WITHOUT CONTRAST  Technique: Multiplanar, multiecho pulse sequences of the brain and surrounding structures were obtained according to standard protocol without  intravenous contrast.  Findings: Chronic ventriculomegaly does not appear significantly changed since 2007.  There is evidence of hyperdynamic flow in the cerebral aqueduct on axial and coronal T2-weighted images (but not evident on sagittal T1).  There is also chronic periventricular T2 and FLAIR hyperintensity which could reflect transependymal edema. However, the temporal horns have been relatively diminutive on all studies.  There is a small patchy area of increased trace diffusion signal in the right corona radiata (series 5 images 21, 22).  There is associated T2 and FLAIR hyperintensity.  Signal here on the ADC map is fairly isointense.  No mass effect.  No other diffusion abnormality. No acute intracranial hemorrhage identified.  Major intracranial vascular flow voids are preserved.  No midline shift, mass effect, or evidence of mass lesion. Negative pituitary, cervicomedullary junction and visualized cervical spine.  Normal bone marrow signal. Small focus of T2 hyperintensity in the dorsal right thalamus suggests a chronic lacunar infarct which is new since 2010.  Other gray and white matter signal outside of the right corona radiata has not significantly changed since 2010.  Stable orbit soft tissues. Visualized paranasal sinuses and mastoids are clear.  Chronic small volume fluid in the right anterior mastoid air cells.  Negative scalp soft tissues.  Small cystic area in the left maxilla alveolar process appears to be benign and dental related.  IMPRESSION: 1.  Small subacute appearing white matter infarcts in the right corona radiata out a.  No mass effect or hemorrhage. 2.  Left thalamic chronic lacunar infarct is  new since 2010. 3.  Chronic ventriculomegaly, with suspected hyperdynamic flow in the cerebral aqueduct and a low level of transependymal edema. This might reflect chronic or congenital communicating hydrocephalus, and in the appropriate clinical setting normal pressure hydrocephalus could not be excluded. 4.  See MRA findings below.  MRA HEAD WITHOUT CONTRAST  Technique: Angiographic images of the Circle of Willis were obtained using MRA technique without  intravenous contrast.  Findings: Stable antegrade flow in the posterior circulation. Codominant distal vertebral arteries.  Normal PICA vessels.  Patent vertebrobasilar junction.  AICA origins are patent.  No basilar stenosis.  SCA and right PCA origins are normal.  Fetal left PCA origin.  Right posterior communicating artery also is present. Bilateral PCA branches are within normal limits.  Antegrade flow in both ICA siphons.  There is chronic irregularity and stenosis of the right ICA at the skull base.  The eccentric flow signal is such that a chronic dissection cannot be excluded. The degree of stenosis is approximately 50-55 % with respect to the distal vessel. The appearance has not significantly changed since 2010, and just caudal to this level there is chronic tortuosity of the cervical right ICA.  Cavernous ICA irregularity greater on the right has progressed. There is moderate stenosis near the right ICA anterior genu.  The right ophthalmic artery origin now is more difficult to visualize. The left ophthalmic and both posterior communicating artery origins are within normal limits.  Stable carotid termini.  Dominant left ACA A1 segment again noted. Left ACA and MCA origins are stable within normal limits.  Normal anterior communicating artery.  Visualized ACA branches are stable within normal limits.  Left MCA branches are stable and within normal limits.  Right MCA branches are stable within normal limits.  IMPRESSION: 1.  Progressed right greater than  left cavernous ICA atherosclerosis and stenosis.  There is moderate right ICA stenosis in the anterior genu. 2.  Chronic irregularity and 50 -  55% stenosis of the right ICA at the skull base.  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  The appearance is stable since 2010. 3.  Posterior circulation and other anterior circulation vessels are stable and within normal limits.   Original Report Authenticated By: Erskine Speed, M.D.    Mr Lumbar Spine Wo Contrast  01/11/2013  *RADIOLOGY REPORT*  Clinical Data: Bilateral lower extremity weakness.  Urinary incontinence.   Fever.  MRI LUMBAR SPINE WITHOUT CONTRAST  Technique:  Multiplanar and multiecho pulse sequences of the lumbar spine were obtained without intravenous contrast.  Comparison: None.  Findings: Normal conus tip at L1.  Normal paraspinal soft tissues.  T10-11 through L2-3:  Normal.  L3-4:  4 mm spondylolisthesis due to moderately severe bilateral facet arthritis.  Disc space narrowing with broad-based disc bulge as well as a disc protrusion into the right neural foramen compressing the right L3 nerve.  The thecal sac is severely compressed particularly in the right lateral recess.  This should affect the right L4 nerve and possibly the left L4 nerve.  L4-5:  Prominent central disc protrusion and extrusion extending superiorly behind the body of L4 in the midline compressing the ventral aspect of the thecal sac.  The protrusion at the level of the disc space compresses the thecal sac and the left L5 nerve in the lateral recess.   No foraminal stenosis.  L5-S1:  Normal.  IMPRESSION:  1.  Severe compression of the thecal sac at L3-4 due to a combination of a 4 mm spondylolisthesis and broad-based disc bulge as well as a protrusion into the right neural foramen compressing the right L3 nerve. 2. Compression of the left lateral recess and the left L5 nerve by a central disc protrusion and extrusion at L4-5.   Original Report Authenticated By: Francene Boyers, M.D.      Scheduled Meds: . aspirin  81 mg Oral Daily  . cefTRIAXone (ROCEPHIN)  IV  1 g Intravenous Q24H  . enoxaparin (LOVENOX) injection  40 mg Subcutaneous Q24H  . insulin aspart  0-5 Units Subcutaneous QHS  . insulin aspart  0-9 Units Subcutaneous TID WC  . insulin detemir  18 Units Subcutaneous BID  . linagliptin  5 mg Oral Daily  . lisinopril  5 mg Oral Daily  . simvastatin  20 mg Oral q1800  . sodium chloride  3 mL Intravenous Q12H   Continuous Infusions: . sodium chloride 75 mL/hr at 01/10/13 1222     Dawn [name]  [facility] Pager [phone]. If 7PM-7AM, please contact night-coverage at [email or web address], password [facility] 01/11/2013, 7:10 PM  LOS: 2 days

## 2013-01-11 NOTE — Progress Notes (Signed)
  Echocardiogram 2D Echocardiogram has been performed.  Nicholous Girgenti FRANCES 01/11/2013, 11:42 AM

## 2013-01-12 DIAGNOSIS — E785 Hyperlipidemia, unspecified: Secondary | ICD-10-CM

## 2013-01-12 DIAGNOSIS — R7309 Other abnormal glucose: Secondary | ICD-10-CM

## 2013-01-12 LAB — URINE CULTURE: Colony Count: 50000

## 2013-01-12 LAB — GLUCOSE, CAPILLARY: Glucose-Capillary: 158 mg/dL — ABNORMAL HIGH (ref 70–99)

## 2013-01-12 LAB — LIPID PANEL
Cholesterol: 147 mg/dL (ref 0–200)
Total CHOL/HDL Ratio: 6.4 RATIO
Triglycerides: 178 mg/dL — ABNORMAL HIGH (ref <150)
VLDL: 36 mg/dL (ref 0–40)

## 2013-01-12 MED ORDER — SIMVASTATIN 20 MG PO TABS: 20.0000 mg | ORAL_TABLET | Freq: Every day | ORAL | Status: AC

## 2013-01-12 MED ORDER — INSULIN DETEMIR 100 UNIT/ML ~~LOC~~ SOLN: 22.0000 [IU] | Freq: Two times a day (BID) | SUBCUTANEOUS | Status: DC

## 2013-01-12 MED ORDER — GABAPENTIN 100 MG PO CAPS: 100.0000 mg | ORAL_CAPSULE | Freq: Three times a day (TID) | ORAL | Status: AC

## 2013-01-12 MED ORDER — CEFUROXIME AXETIL 250 MG PO TABS: 250.0000 mg | ORAL_TABLET | Freq: Two times a day (BID) | ORAL | Status: AC

## 2013-01-12 MED ORDER — LISINOPRIL 5 MG PO TABS: 5.0000 mg | ORAL_TABLET | Freq: Every day | ORAL | Status: DC

## 2013-01-12 MED ORDER — ASPIRIN 81 MG PO CHEW: 81.0000 mg | CHEWABLE_TABLET | Freq: Every day | ORAL | Status: DC

## 2013-01-12 NOTE — Progress Notes (Signed)
Pt discharged to home per MD order. Pt and husband received and reviewed all discharge instructions and medication information including follow-up appointments and prescriptions.  Pt and husband also reviewed stroke education at discharge. NIH completed prior to discharge. Pt alert and oriented at discharge with no complaints of pain.  Pt escorted to private vehicle via wheelchair by nurse tech. Efraim Kaufmann

## 2013-01-12 NOTE — Evaluation (Signed)
Speech Language Pathology Evaluation Patient Details Name: Dawn [name] MRN: [id number] DOB: 15-Oct-1951 Today's Date: 01/12/2013 Time: 4098-1191 SLP Time Calculation (min): 48 min  Problem List:  Patient Active Problem List  Diagnosis  . Fever  . Tachycardia  . Diabetes  . Hypertension   Past Medical History:  Past Medical History  Diagnosis Date  . Diabetes mellitus without complication   . Hypertension    Past Surgical History:  Past Surgical History  Procedure Laterality Date  . No past surgeries     HPI:  62 y.o. female with small right subacute appearing white matter infarcts in the right corona radiata, most likely resulting from small vessel disease in the context of long standing HTN and DM.   Assessment / Plan / Recommendation Clinical Impression  Pt demonstrates mild cogntiive defictis including short term and prospective memory, complex verbal and functional problem solving and reasoning. Pt also notices a difference in her accuracy and fluency with reading though she did not endorse any visual deficits. Pt had some minimal awareness of impairment in the recent past, but with testing became more aware of changes. Pt in agreement with f/u SLP therapy in outpatient setting to address higher level cognition for return to functional independence. SLP will f/u in acute care.     SLP Assessment  Patient needs continued Speech Lanaguage Pathology Services    Follow Up Recommendations  Outpatient SLP    Frequency and Duration min 2x/week  2 weeks   Pertinent Vitals/Pain NA   SLP Goals  SLP Goals Potential to Achieve Goals: Good SLP Goal #1: Pt will use compensatory strategies for mathematical reasoning task with moderate verbal cues x3.  SLP Goal #2: Pt will use compensatory strategies for memory with newly learned information with moderate verbal cues x3.   SLP Evaluation Prior Functioning  Cognitive/Linguistic Baseline: Baseline deficits (memory of the past few  months) Type of Home: House Lives With: Significant other;Family Available Help at Discharge: Family;Available PRN/intermittently Vocation: Unemployed   Cognition  Overall Cognitive Status: Impaired Arousal/Alertness: Awake/alert Orientation Level: Oriented X4 Attention: Focused;Sustained;Selective;Alternating;Divided Focused Attention: Appears intact Sustained Attention: Appears intact Selective Attention: Appears intact Alternating Attention: Appears intact Divided Attention: Impaired Memory: Impaired Memory Impairment: Retrieval deficit;Storage deficit;Decreased recall of new information;Prospective memory Awareness: Impaired Awareness Impairment: Anticipatory impairment;Intellectual impairment (noticed errors "im not reading right") Problem Solving: Impaired Problem Solving Impairment: Functional complex;Verbal complex Executive Function: Reasoning;Sequencing;Organizing Reasoning: Impaired Reasoning Impairment: Verbal complex;Functional complex Sequencing: Impaired Sequencing Impairment: Functional complex Organizing: Impaired Organizing Impairment: Functional complex    Comprehension  Auditory Comprehension Overall Auditory Comprehension: Appears within functional limits for tasks assessed Reading Comprehension Reading Status: Impaired Word level: Within functional limits Sentence Level: Impaired Paragraph Level: Impaired (minor miscues (phonemes, morphemes))    Expression Verbal Expression Overall Verbal Expression: Appears within functional limits for tasks assessed Written Expression Dominant Hand: Right Written Expression: Within Functional Limits   Oral / Motor Oral Motor/Sensory Function Overall Oral Motor/Sensory Function: Appears within functional limits for tasks assessed Motor Speech Overall Motor Speech: Appears within functional limits for tasks assessed   GO    Harlon Ditty, MA CCC-SLP [phone]  Claudine Mouton 01/12/2013, 10:08  AM

## 2013-01-12 NOTE — Progress Notes (Signed)
Physical Therapy Treatment Patient Details Name: Dawn [name] MRN: [id number] DOB: Nov 19, 1950 Today's Date: 01/12/2013 Time: 4098-1191 PT Time Calculation (min): 25 min  PT Assessment / Plan / Recommendation Comments on Treatment Session  pt seems to have improved slightly from last treatment with less gait instability, but unsteadiness remains and pt can benefit from HHPT.    Follow Up Recommendations  Home health PT;Supervision - Intermittent     Does the patient have the potential to tolerate intense rehabilitation     Barriers to Discharge        Equipment Recommendations  None recommended by PT    Recommendations for Other Services    Frequency Min 3X/week   Plan Discharge plan remains appropriate;Frequency remains appropriate    Precautions / Restrictions Precautions Precautions: Fall Precaution Comments: gait speed and gait pattern put her at risk for recurrent falls.  No h/o falls at home per husband, just slowed down.   Restrictions Weight Bearing Restrictions: No   Pertinent Vitals/Pain     Mobility  Bed Mobility Bed Mobility: Not assessed Transfers Transfers: Sit to Stand;Stand to Sit Sit to Stand: 6: Modified independent (Device/Increase time);5: Supervision Stand to Sit: 6: Modified independent (Device/Increase time) Details for Transfer Assistance: safe mobility.  Pt held to support until she felt steady Ambulation/Gait Ambulation/Gait Assistance: 5: Supervision Ambulation Distance (Feet): 500 Feet Assistive device: None Ambulation/Gait Assistance Details: episodes of mild unsteadiness, but generally steady.  Pt reports not feeling steady.  Challenged with higher level balance incl cadence changes scanning in a 360* arc, starts/stops abrupt directional changes without LOB or any overt deviation. Gait Pattern: Step-through pattern (wider BOS) Stairs: Yes Stairs Assistance: 4: Min guard Stair Management Technique: One rail Right;Alternating pattern;Step to  pattern;Forwards;Other (comment) (alternating up and step to down (down more disconcerting)) Number of Stairs: 3 Wheelchair Mobility Wheelchair Mobility: No    Exercises     PT Diagnosis:    PT Problem List:   PT Treatment Interventions:     PT Goals Acute Rehab PT Goals Time For Goal Achievement: 01/24/13 Potential to Achieve Goals: Good Pt will go Sit to Stand: with modified independence;with upper extremity assist PT Goal: Sit to Stand - Progress: Progressing toward goal Pt will go Stand to Sit: with modified independence;with upper extremity assist PT Goal: Stand to Sit - Progress: Progressing toward goal Pt will Ambulate: >150 feet;with modified independence;with least restrictive assistive device PT Goal: Ambulate - Progress: Progressing toward goal Pt will Go Up / Down Stairs: Flight;with modified independence;with rail(s) PT Goal: Up/Down Stairs - Progress: Progressing toward goal  Visit Information  Last PT Received On: 01/12/13 Assistance Needed: +1    Subjective Data  Subjective: I'm so used to walking fast and thinking fast and I can't now.   Cognition  Cognition Overall Cognitive Status: Appears within functional limits for tasks assessed/performed Arousal/Alertness: Awake/alert Behavior During Session: [facility] for tasks performed    Balance  Balance Balance Assessed: Yes Standardized Balance Assessment Standardized Balance Assessment: Dynamic Gait Index Dynamic Gait Index Level Surface: Normal Change in Gait Speed: Mild Impairment Gait with Horizontal Head Turns: Normal Gait with Vertical Head Turns: Normal Gait and Pivot Turn: Mild Impairment Step Over Obstacle: Mild Impairment Steps: Mild Impairment (Mild impairment in several activities suggests risk of fall)  End of Session PT - End of Session Activity Tolerance: Patient tolerated treatment well Patient left: Other (comment);with call bell/phone within reach (sitting EOB) Nurse Communication:  Mobility status   GP     Kamin Niblack,  [name] 01/12/2013, 4:45 PM 01/12/2013  [name], PT [phone] (pager)

## 2013-01-12 NOTE — Progress Notes (Signed)
01/10/13 1628  PT G-Codes **NOT FOR INPATIENT CLASS**  Functional Assessment Tool Used gait speed  Mobility: Walking and Moving Around Current Status (N8295) CJ  Mobility: Walking and Moving Around Goal Status (A2130) CI   Late entry 02/01/2013 10:40 am missed G-codes.  Rollene Rotunda Karalynn Cottone, PT, DPT [phone]

## 2013-01-12 NOTE — Care Management Note (Signed)
    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     HH arranged  HH-2 PT  HH-3 OT  HH-5 SPEECH THERAPY      HH agency  [facility]   Status of service:  Completed, signed off Medicare Important Message given?   (If response is "NO", the following Medicare IM given date fields will be blank) Date Medicare IM given:   Date Additional Medicare IM given:    Discharge Disposition:  HOME W HOME HEALTH SERVICES  Per UR Regulation:  Reviewed for med. necessity/level of care/duration of stay  If discussed at Long Length of Stay Meetings, dates discussed:    Comments:  01/12/13- 1600- Donn Pierini RN BSN [phone] Pt for d/c today with HH orders for PT/OT/ST- in to speak with pt - per conversation pt agreeable to [facility] services- List of agencies for [facility] given to pt - per pt choice wants to use [facility]- call made to [facility] to verify that they cotract with [facility]- per Maralyn Sago at [facility] they are able to take [facility]- referral given over phone and orders faxed to [facility]- [phone]- pt also would like to have RW for home spoke with MD and verbal order given for RW- call made to Darrien with [facility] for DME- to bring RW to room prior to discharge. Pt's spouse on his way to pick pt up- no further CM needs.

## 2013-01-12 NOTE — Progress Notes (Signed)
HOME HEALTH AGENCIES SERVING [place]   Agencies that are Medicare-Certified and are affiliated with [facility]  Telephone Number Address  [facility]   [facility] has ownership interest in this company; however, you are under no obligation to use this agency. [phone] or  [phone] [address] [place], [place] [address] [email or web address]   Agencies that are Medicare-Certified and are not affiliated with [facility]                                                                                 Home Health Agency Telephone Number Address  [facility] [phone] Fax [phone] [address] [place], [place]  [address] [email or web address]  [facility] [phone] or [phone] Fax [phone] [address] [place], [place] [address] [email or web address]  [facility] [phone] Fax [phone] [address] [place], [place] [address] [email or web address]  [facility] [phone] Fax [phone] [address] [place], [place]  [address] [email or web address]  [facility] [phone] Fax [phone] [address], [address] [place], [place] [address] [email or web address]  [facility] [phone] [address] [place], [place] [address] [email or web address]  [facility] [phone]  [address] [place], [place] [address] [email or web address]  [facility] [phone] or [phone] Fax number [phone] [address] [place], [place]  [address] [email or web address]  [facility] [phone] Fax [phone] [address] [place], [place]  [address]  [facility]  [phone] Fax [phone] [address] [place], [place] [address] [email or web address]       Agencies that are not Medicare-Certified and are not affiliated with [facility]   Home Health Agency Telephone Number Address  [facility] [phone] or [phone] Fax [phone] [address] [place], [place]  [address] [email or web address]  [facility] [phone] Fax [phone] [address] [place], [place]  [address] [email or web address]  [facility] [phone] Fax [phone] [address] [place], [place]  [address] [email or web address]  [facility]  [phone] Fax [phone] [address] [place], [place] [address] [email or web address]  [facility] [phone] Fax [phone] [address] [place], [place] [address]  [facility] [phone] or [phone] Fax [phone] [address] [place], [place]  [address] [email or web address]  [facility] [phone] or [phone] Fax [phone] [address] [place], [place]  [address] [email or web address]  [facility] [phone] Fax [phone] [address] [place], [place]  [address] [email or web address]  [facility] [phone] [address] [place], [place]  [address]  [facility] [phone] Fax [phone] [address] [place], [place]  [address]  [facility] [phone] Fax [phone] [address] [place], [place]  [address] [email or web address]  [facility] [phone] Fax [phone] [address] [place],  [address] [email or web address]  [facility] [phone] Fax [phone] [address] ,     [address] [email or web address]   

## 2013-01-12 NOTE — Discharge Summary (Signed)
Physician Discharge Summary  Dawn [name] ZOX:[id number] DOB: [birth year] DOA: 01/09/2013  PCP: Alva Garnet., MD  Admit date: 01/09/2013 Discharge date: 01/12/2013  Time spent: >30 minutes  Recommendations for Outpatient Follow-up:  1. Reassess BP and adjust medications as needed 2. Check BMET to follow kidney function and electorlytes 3. Close follow up to her DM  Discharge Diagnoses:  Active Problems:   Fever   Tachycardia   Diabetes   Hypertension HLD Subacute right corona radiata stroke Encephalopathy (toxic.metabolic) UTI  Discharge Condition: stable and improved. Will follow with PCP in 2 weeks. Advised to take medications as prescribed and to follow discharge instructions. HH services arranged at discharge (PT, OT and SLP)  Diet recommendation: low carbohydrates, low fat and low sodium diet.  Filed Weights   01/10/13 0204 01/12/13 0547  Weight: 69.491 kg (153 lb 3.2 oz) 74.662 kg (164 lb 9.6 oz)    History of present illness:  62 y.o. female with a past medical history significant for hypertension, DM, admitted to [facility] on 3/24 due to confusion, decreased responsiveness, no feeling well.  She recalls that she was kind of confused and her husband became concerned because she was less responsive than usual.  However, she denies headache, vertigo, double vision, difficulty swallowing or speaking, slurred speech, language or vision impairment.  The initial work up in the hospital was not particularly impressive but MRI-DWI completed today revealed a small subacute appearing white matter infarcts in the right corona radiata. 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 Posterior circulation is okay.  Mrs. Dawn [name] reports gait difficulty that she described as " slow walking" over the past 2-3 months. In addition, relates mild forgetfulness.No bladder  incontinence.  Hospital Course:  1-Fever/ tachycardia/ mild leukocytosis; presumed to be due to UTI: after 3 days of IV abx's patient finally afbrile and with normalization on her WBC's. Will discharge of ceftin BID for 3 more days to finish abx's therapy. Culture not helpful isolating particular microorganism this time.  2-Brief episode of confusion/ generalized weakness and urinary incontinence since yesterday: secondary to subacute stroke and toxic encephalopathy. Treatment for infection as mentioned above. Will control risk factors for stroke, use ASA for secondary prevention and arranged HH services for PT/OT and SLP.  3-Diabetes Mellitus: resume levemir, dose adjusted to 22 units BID for better control. Will also continue tradjenta and metformin.  4. Subacute right corona radiata stroke:  -appears to be secondary to small vessels diseae due to HTN and DM. -will use ASA for secondary prevention -Start statins -better control of HTN and also DM -PT, OT and SLP as an outpatient for rehab.  5. HTN: started on lisinopril daily for better control.  6. HLD: started on statins. LDL 88 (for diabetics  Needs to be as close as possible to 70, especially with hx of CVA). TG also elevated; advised to follow low fat diet.  *rest of medical problems remains stable and the plan is to continue current medication regimen.  Procedures: 2-D echo: The cavity size was normal. Wall thickness was increased in a pattern of moderate to severe LVH. Systolic function was vigorous. The estimated ejection fraction was in the range of 65% to 70%. Wall motion was normal; there were no regional wall motion abnormalities.  Carotid Dopplers: No significant extracranial carotid artery stenosis demonstrated. Vertebrals are patent with antegrade flow.  *See below for x-ray reports.  Consultations:  PT/OT/SLP  Neurology  Discharge Exam: Filed Vitals:   01/12/13 0000 01/12/13 0547 01/12/13 1033 01/12/13 1054   BP: 143/86 139/89 110/72 118/94  Pulse: 110 98 108   Temp: 97.8 F (36.6 C) 98.5 F (36.9 C) 98.3 F (36.8 C)   TempSrc: Axillary  Oral   Resp:   18   Height:      Weight:  74.662 kg (164 lb 9.6 oz)    SpO2: 97% 99% 99%     General: AAOX3, NAD, afebrile, no motor deficit Cardiovascular: S1 and S2, no rubs, no gallops, rate controlled Respiratory: CTA bilaterally Abdomen: soft, NT, ND, positive BS Neuro: no motro deficit appreciated, mild numbness and pain on her feet bilaterally from diabetic neuropathy; CN grossly intact  Discharge Instructions  Discharge Orders   Future Orders Complete By Expires     Discharge instructions  As directed     Comments:      Take medications as prescribed Follow with PCP in 1-2 weeks Follow a low sodium and low carbohydrates diet    Increase activity slowly  As directed         Medication List    TAKE these medications       aspirin 81 MG chewable tablet  Chew 1 tablet (81 mg total) by mouth daily.     cefUROXime 250 MG tablet  Commonly known as:  CEFTIN  Take 1 tablet (250 mg total) by mouth 2 (two) times daily.     insulin detemir 100 UNIT/ML injection  Commonly known as:  LEVEMIR  Inject 0.22 mLs (22 Units total) into the skin 2 (two) times daily.     lisinopril 5 MG tablet  Commonly known as:  PRINIVIL,ZESTRIL  Take 1 tablet (5 mg total) by mouth daily.     metFORMIN 1000 MG tablet  Commonly known as:  GLUCOPHAGE  Take 1,000 mg by mouth 2 (two) times daily with a meal.     simvastatin 20 MG tablet  Commonly known as:  ZOCOR  Take 1 tablet (20 mg total) by mouth daily at 6 PM.     sitaGLIPtin 50 MG tablet  Commonly known as:  JANUVIA  Take 50 mg by mouth 2 (two) times daily.           Follow-up Information   Follow up with Alva Garnet., MD. Schedule an appointment as soon as possible for a visit in 2 weeks.   Contact information:   [address] [place] [address] [phone]         The results of significant diagnostics from this hospitalization (including imaging, microbiology, ancillary and laboratory) are listed below for reference.    Significant Diagnostic Studies: Dg Chest 2 View  01/09/2013  *RADIOLOGY REPORT*  Clinical Data: Altered mental status and fever.  CHEST - 2 VIEW  Comparison: 09/23/2004.  Findings: The cardiac silhouette, mediastinal and hilar contours are stable.  There is tortuosity and ectasia of the thoracic aorta. There are chronic bronchitic type interstitial lung changes but no definite acute overlying pulmonary process.  No pleural effusion. The bony thorax is intact.  Suspect stone filled gallbladder.  IMPRESSION:  1.  Chronic lung changes but no acute pulmonary findings. 2.  Cholelithiasis.   Original Report Authenticated By: Rudie Meyer, M.D.    Mr Maxine Glenn Head Wo Contrast  01/11/2013  *RADIOLOGY REPORT*  Clinical Data:  62 year old female with acute onset weakness and altered mental status.  Comparison: Brain MRI and MRA 08/13/2009.  MRI HEAD WITHOUT  CONTRAST  Technique: Multiplanar, multiecho pulse sequences of the brain and surrounding structures were obtained according to standard protocol without intravenous contrast.  Findings: Chronic ventriculomegaly does not appear significantly changed since 2007.  There is evidence of hyperdynamic flow in the cerebral aqueduct on axial and coronal T2-weighted images (but not evident on sagittal T1).  There is also chronic periventricular T2 and FLAIR hyperintensity which could reflect transependymal edema. However, the temporal horns have been relatively diminutive on all studies.  There is a small patchy area of increased trace diffusion signal in the right corona radiata (series 5 images 21, 22).  There is associated T2 and FLAIR hyperintensity.  Signal here on the ADC map is fairly isointense.  No mass effect.  No other diffusion abnormality. No acute intracranial hemorrhage identified.  Major intracranial  vascular flow voids are preserved.  No midline shift, mass effect, or evidence of mass lesion. Negative pituitary, cervicomedullary junction and visualized cervical spine.  Normal bone marrow signal. Small focus of T2 hyperintensity in the dorsal right thalamus suggests a chronic lacunar infarct which is new since 2010.  Other gray and white matter signal outside of the right corona radiata has not significantly changed since 2010.  Stable orbit soft tissues. Visualized paranasal sinuses and mastoids are clear.  Chronic small volume fluid in the right anterior mastoid air cells.  Negative scalp soft tissues.  Small cystic area in the left maxilla alveolar process appears to be benign and dental related.  IMPRESSION: 1.  Small subacute appearing white matter infarcts in the right corona radiata out a.  No mass effect or hemorrhage. 2.  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 3.  Chronic ventriculomegaly, with suspected hyperdynamic flow in the cerebral aqueduct and a low level of transependymal edema. This might reflect chronic or congenital communicating hydrocephalus, and in the appropriate clinical setting normal pressure hydrocephalus could not be excluded. 4.  See MRA findings below.  MRA HEAD WITHOUT CONTRAST  Technique: Angiographic images of the Circle of Willis were obtained using MRA technique without  intravenous contrast.  Findings: Stable antegrade flow in the posterior circulation. Codominant distal vertebral arteries.  Normal PICA vessels.  Patent vertebrobasilar junction.  AICA origins are patent.  No basilar stenosis.  SCA and right PCA origins are normal.  Fetal left PCA origin.  Right posterior communicating artery also is present. Bilateral PCA branches are within normal limits.  Antegrade flow in both ICA siphons.  There is chronic irregularity and stenosis of the right ICA at the skull base.  The eccentric flow signal is such that a chronic dissection cannot be excluded. The  degree of stenosis is approximately 50-55 % with respect to the distal vessel. The appearance has not significantly changed since 2010, and just caudal to this level there is chronic tortuosity of the cervical right ICA.  Cavernous ICA irregularity greater on the right has progressed. There is moderate stenosis near the right ICA anterior genu.  The right ophthalmic artery origin now is more difficult to visualize. The left ophthalmic and both posterior communicating artery origins are within normal limits.  Stable carotid termini.  Dominant left ACA A1 segment again noted. Left ACA and MCA origins are stable within normal limits.  Normal anterior communicating artery.  Visualized ACA branches are stable within normal limits.  Left MCA branches are stable and within normal limits.  Right MCA branches are stable within normal limits.  IMPRESSION: 1.  Progressed right greater than left cavernous ICA  atherosclerosis and stenosis.  There is moderate right ICA stenosis in the anterior genu. 2.  Chronic irregularity and 50 - 55% stenosis of the right ICA at the skull base.  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  The appearance is stable since 2010. 3.  Posterior circulation and other anterior circulation vessels are stable and within normal limits.   Original Report Authenticated By: Erskine Speed, M.D.    Mr Brain Wo Contrast  01/11/2013  *RADIOLOGY REPORT*  Clinical Data:  62 year old female with acute onset weakness and altered mental status.  Comparison: Brain MRI and MRA 08/13/2009.  MRI HEAD WITHOUT CONTRAST  Technique: Multiplanar, multiecho pulse sequences of the brain and surrounding structures were obtained according to standard protocol without intravenous contrast.  Findings: Chronic ventriculomegaly does not appear significantly changed since 2007.  There is evidence of hyperdynamic flow in the cerebral aqueduct on axial and coronal T2-weighted images (but not evident on sagittal T1).  There is also  chronic periventricular T2 and FLAIR hyperintensity which could reflect transependymal edema. However, the temporal horns have been relatively diminutive on all studies.  There is a small patchy area of increased trace diffusion signal in the right corona radiata (series 5 images 21, 22).  There is associated T2 and FLAIR hyperintensity.  Signal here on the ADC map is fairly isointense.  No mass effect.  No other diffusion abnormality. No acute intracranial hemorrhage identified.  Major intracranial vascular flow voids are preserved.  No midline shift, mass effect, or evidence of mass lesion. Negative pituitary, cervicomedullary junction and visualized cervical spine.  Normal bone marrow signal. Small focus of T2 hyperintensity in the dorsal right thalamus suggests a chronic lacunar infarct which is new since 2010.  Other gray and white matter signal outside of the right corona radiata has not significantly changed since 2010.  Stable orbit soft tissues. Visualized paranasal sinuses and mastoids are clear.  Chronic small volume fluid in the right anterior mastoid air cells.  Negative scalp soft tissues.  Small cystic area in the left maxilla alveolar process appears to be benign and dental related.  IMPRESSION: 1.  Small subacute appearing white matter infarcts in the right corona radiata out a.  No mass effect or hemorrhage. 2.  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 3.  Chronic ventriculomegaly, with suspected hyperdynamic flow in the cerebral aqueduct and a low level of transependymal edema. This might reflect chronic or congenital communicating hydrocephalus, and in the appropriate clinical setting normal pressure hydrocephalus could not be excluded. 4.  See MRA findings below.  MRA HEAD WITHOUT CONTRAST  Technique: Angiographic images of the Circle of Willis were obtained using MRA technique without  intravenous contrast.  Findings: Stable antegrade flow in the posterior circulation.  Codominant distal vertebral arteries.  Normal PICA vessels.  Patent vertebrobasilar junction.  AICA origins are patent.  No basilar stenosis.  SCA and right PCA origins are normal.  Fetal left PCA origin.  Right posterior communicating artery also is present. Bilateral PCA branches are within normal limits.  Antegrade flow in both ICA siphons.  There is chronic irregularity and stenosis of the right ICA at the skull base.  The eccentric flow signal is such that a chronic dissection cannot be excluded. The degree of stenosis is approximately 50-55 % with respect to the distal vessel. The appearance has not significantly changed since 2010, and just caudal to this level there is chronic tortuosity of the cervical right ICA.  Cavernous ICA irregularity  greater on the right has progressed. There is moderate stenosis near the right ICA anterior genu.  The right ophthalmic artery origin now is more difficult to visualize. The left ophthalmic and both posterior communicating artery origins are within normal limits.  Stable carotid termini.  Dominant left ACA A1 segment again noted. Left ACA and MCA origins are stable within normal limits.  Normal anterior communicating artery.  Visualized ACA branches are stable within normal limits.  Left MCA branches are stable and within normal limits.  Right MCA branches are stable within normal limits.  IMPRESSION: 1.  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  There is moderate right ICA stenosis in the anterior genu. 2.  Chronic irregularity and 50 - 55% stenosis of the right ICA at the skull base.  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  The appearance is stable since 2010. 3.  Posterior circulation and other anterior circulation vessels are stable and within normal limits.   Original Report Authenticated By: Erskine Speed, M.D.    Mr Lumbar Spine Wo Contrast  01/11/2013  *RADIOLOGY REPORT*  Clinical Data: Bilateral lower extremity  weakness.  Urinary incontinence.   Fever.  MRI LUMBAR SPINE WITHOUT CONTRAST  Technique:  Multiplanar and multiecho pulse sequences of the lumbar spine were obtained without intravenous contrast.  Comparison: None.  Findings: Normal conus tip at L1.  Normal paraspinal soft tissues.  T10-11 through L2-3:  Normal.  L3-4:  4 mm spondylolisthesis due to moderately severe bilateral facet arthritis.  Disc space narrowing with broad-based disc bulge as well as a disc protrusion into the right neural foramen compressing the right L3 nerve.  The thecal sac is severely compressed particularly in the right lateral recess.  This should affect the right L4 nerve and possibly the left L4 nerve.  L4-5:  Prominent central disc protrusion and extrusion extending superiorly behind the body of L4 in the midline compressing the ventral aspect of the thecal sac.  The protrusion at the level of the disc space compresses the thecal sac and the left L5 nerve in the lateral recess.   No foraminal stenosis.  L5-S1:  Normal.  IMPRESSION:  1.  Severe compression of the thecal sac at L3-4 due to a combination of a 4 mm spondylolisthesis and broad-based disc bulge as well as a protrusion into the right neural foramen compressing the right L3 nerve. 2. Compression of the left lateral recess and the left L5 nerve by a central disc protrusion and extrusion at L4-5.   Original Report Authenticated By: Francene Boyers, M.D.     Microbiology: Recent Results (from the past 240 hour(s))  URINE CULTURE     Status: None   Collection Time    01/09/13  9:15 PM      Result Value Range Status   Specimen Description URINE, CLEAN CATCH   Final   Special Requests ADD [id number]   Final   Culture  Setup Time 01/10/2013 15:47   Final   Colony Count 50,000 COLONIES/ML   Final   Culture     Final   Value: Multiple bacterial morphotypes present, none predominant. Suggest appropriate recollection if clinically indicated.   Report Status 01/12/2013 FINAL    Final  CULTURE, BLOOD (ROUTINE X 2)     Status: None   Collection Time    01/09/13  9:50 PM      Result Value Range Status   Specimen Description BLOOD LEFT ARM   Final  Special Requests BOTTLES DRAWN AEROBIC AND ANAEROBIC 10CC   Final   Culture  Setup Time 01/10/2013 08:58   Final   Culture     Final   Value:        BLOOD CULTURE RECEIVED NO GROWTH TO DATE CULTURE WILL BE HELD FOR 5 DAYS BEFORE ISSUING A FINAL NEGATIVE REPORT   Report Status PENDING   Incomplete  CULTURE, BLOOD (ROUTINE X 2)     Status: None   Collection Time    01/09/13 10:00 PM      Result Value Range Status   Specimen Description BLOOD LEFT HAND   Final   Special Requests BOTTLES DRAWN AEROBIC AND ANAEROBIC 10CC   Final   Culture  Setup Time 01/10/2013 08:58   Final   Culture     Final   Value:        BLOOD CULTURE RECEIVED NO GROWTH TO DATE CULTURE WILL BE HELD FOR 5 DAYS BEFORE ISSUING A FINAL NEGATIVE REPORT   Report Status PENDING   Incomplete     Labs: Basic Metabolic Panel:  Recent Labs Lab 01/09/13 2142 01/10/13 0558  NA 135 141  K 3.5 4.0  CL 94* 104  CO2 27 26  GLUCOSE 382* 218*  BUN 17 11  CREATININE 0.93 0.72  CALCIUM 9.3 8.7   Liver Function Tests:  Recent Labs Lab 01/09/13 2142  AST 19  ALT 14  ALKPHOS 76  BILITOT 0.5  PROT 8.2  ALBUMIN 3.9   CBC:  Recent Labs Lab 01/09/13 2053 01/10/13 0558  WBC 12.1* 10.7*  NEUTROABS 10.2*  --   HGB 13.1 11.7*  HCT 36.9 34.3*  MCV 78.3 77.8*  PLT 220 179   Cardiac Enzymes:  Recent Labs Lab 01/10/13 1131  CKTOTAL 208*   CBG:  Recent Labs Lab 01/11/13 1214 01/11/13 1657 01/11/13 2115 01/12/13 0733 01/12/13 1127  GLUCAP 229* 171* 175* 166* 209*    Signed:  Kirrah Mustin  [facility] 01/12/2013, 3:12 PM

## 2013-01-12 NOTE — Progress Notes (Signed)
Stroke Team Progress Note  HISTORY Dawn [name] is an 62 y.o. female with a past medical history significant for hypertension, DM, admitted to [facility] on 3/24 due to confusion, decreased responsiveness, no feeling well. She recalls that she was kind of confused and her husband became concerned because she was less responsive than usual. However, she denies headache, vertigo, double vision, difficulty swallowing or speaking, slurred speech, language or vision impairment. The initial work up in the hospital was not particularly impressive but MRI-DWI completed today revealed a small subacute appearing white matter infarcts in the right corona radiata. 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 Posterior circulation is okay. Dawn [name] reports gait difficulty that she described as " slow walking" over the past 2-3 months. In addition, relates mild forgetfulness.No bladder incontinence. Patient was not a TPA candidate secondary to delay in arrival.  SUBJECTIVE No family is at the bedside.  Overall she feels her condition is stable.   OBJECTIVE Most recent Vital Signs: Filed Vitals:   01/11/13 1400 01/11/13 2000 01/12/13 0000 01/12/13 0547  BP: 120/67 124/72 143/86 139/89  Pulse: 99 100 110 98  Temp: 98 F (36.7 C) 98.5 F (36.9 C) 97.8 F (36.6 C) 98.5 F (36.9 C)  TempSrc: Oral Oral Axillary   Resp: 18     Height:      Weight:    74.662 kg (164 lb 9.6 oz)  SpO2: 98% 100% 97% 99%   CBG (last 3)   Recent Labs  01/11/13 1657 01/11/13 2115 01/12/13 0733  GLUCAP 171* 175* 166*    IV Fluid Intake:   . sodium chloride 75 mL/hr at 01/10/13 1222    MEDICATIONS  . aspirin  81 mg Oral Daily  . cefTRIAXone (ROCEPHIN)  IV  1 g Intravenous Q24H  . enoxaparin (LOVENOX) injection  40 mg Subcutaneous Q24H  . insulin aspart  0-5 Units Subcutaneous QHS  . insulin aspart  0-9 Units  Subcutaneous TID WC  . insulin detemir  18 Units Subcutaneous BID  . linagliptin  5 mg Oral Daily  . lisinopril  5 mg Oral Daily  . simvastatin  20 mg Oral q1800  . sodium chloride  3 mL Intravenous Q12H   PRN:  acetaminophen, acetaminophen, alum & mag hydroxide-simeth, ondansetron (ZOFRAN) IV, ondansetron, polyethylene glycol  Diet:  Carb Control thin liquids Activity:  Up as tolerated DVT Prophylaxis:  Lovenox 40 mg sq daily   CLINICALLY SIGNIFICANT STUDIES Basic Metabolic Panel:   Recent Labs Lab 01/09/13 2142 01/10/13 0558  NA 135 141  K 3.5 4.0  CL 94* 104  CO2 27 26  GLUCOSE 382* 218*  BUN 17 11  CREATININE 0.93 0.72  CALCIUM 9.3 8.7   Liver Function Tests:   Recent Labs Lab 01/09/13 2142  AST 19  ALT 14  ALKPHOS 76  BILITOT 0.5  PROT 8.2  ALBUMIN 3.9   CBC:   Recent Labs Lab 01/09/13 2053 01/10/13 0558  WBC 12.1* 10.7*  NEUTROABS 10.2*  --   HGB 13.1 11.7*  HCT 36.9 34.3*  MCV 78.3 77.8*  PLT 220 179   Coagulation: No results found for this basename: LABPROT, INR,  in the last 168 hours Cardiac Enzymes:   Recent Labs Lab 01/10/13 1131  CKTOTAL 208*   Urinalysis:   Recent Labs Lab 01/09/13 2115  COLORURINE YELLOW  LABSPEC 1.023  PHURINE 6.5  GLUCOSEU >1000*  HGBUR  SMALL*  BILIRUBINUR NEGATIVE  KETONESUR NEGATIVE  PROTEINUR NEGATIVE  UROBILINOGEN 0.2  NITRITE NEGATIVE  LEUKOCYTESUR NEGATIVE   Lipid Panel    Component Value Date/Time   CHOL 147 01/12/2013 0524   TRIG 178* 01/12/2013 0524   HDL 23* 01/12/2013 0524   CHOLHDL 6.4 01/12/2013 0524   VLDL 36 01/12/2013 0524   LDLCALC 88 01/12/2013 0524   HgbA1C  Lab Results  Component Value Date   HGBA1C 8.8* 01/10/2013    Urine Drug Screen:   No results found for this basename: labopia,  cocainscrnur,  labbenz,  amphetmu,  thcu,  labbarb    Alcohol Level: No results found for this basename: ETH,  in the last 168 hours  CT of the brain    MRI of the brain  01/11/2013   1.   Small subacute appearing white matter infarcts in the right corona radiata out a.  No mass effect or hemorrhage. 2.  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 3.  Chronic ventriculomegaly, with suspected hyperdynamic flow in the cerebral aqueduct and a low level of transependymal edema. This might reflect chronic or congenital communicating hydrocephalus, and in the appropriate clinical setting normal pressure hydrocephalus could not be excluded.   MRA of the brain  01/11/2013   1.  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  There is moderate right ICA stenosis in the anterior genu. 2.  Chronic irregularity and 50 - 55% stenosis of the right ICA at the skull base.  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  The appearance is stable since 2010. 3.  Posterior circulation and other anterior circulation vessels are stable and within normal limits.   MRI Lumbar Spine  01/11/2013   1.  Severe compression of the thecal sac at L3-4 due to a combination of a 4 mm spondylolisthesis and broad-based disc bulge as well as a protrusion into the right neural foramen compressing the right L3 nerve. 2. Compression of the left lateral recess and the left L5 nerve by a central disc protrusion and extrusion at L4-5.    2D Echocardiogram  Severe LVH, EF 65-70% with no source of embolus.   Carotid Doppler  No evidence of hemodynamically significant internal carotid artery stenosis. Vertebral artery flow is antegrade.   CXR  01/09/2013 1. Chronic lung changes but no acute pulmonary findings. 2. Cholelithiasis.  EKG  sinus bradycardia.   Therapy Recommendations home health PT  Physical Exam   Pleasant middle aged female not in distress.Awake alert. Afebrile. Head is nontraumatic. Neck is supple without bruit. Hearing is normal. Cardiac exam no murmur or gallop. Lungs are clear to auscultation. Distal pulses are well felt. Neurological Exam : Awake  Alert oriented x 1 only..  diminished attention, registration and recall. Able to follow midline simple and occasional one-step commands only. Normal speech and language.eye movements full without nystagmus.fundi were not visualized. Vision acuity and fields appear normal. Hearing is normal. Palatal moments are normal. Face symmetric. Tongue midline. Normal strength, tone, reflexes and coordination. Normal sensation. Gait deferred. ASSESSMENT [name] is a 62 y.o. female presenting with confusion, decreased responsiveness, not feeling well.  Imaging confirms small right corona radiata infarcts. 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  On no antiplatelets prior to admission. Now on aspirin 81 mg orally every day for secondary stroke prevention. Work up completed.  Hypertension Diabetes, HgbA1c 8.8 LDL 88, goal < 70 for diabetics, on statin  Hospital day #  3  TREATMENT/PLAN  Continue aspirin 81 mg orally every day for secondary stroke prevention. No further stroke workup indicated. Patient has a 10-15% risk of having another stroke over the next year, the highest risk is within 2 weeks of the most recent stroke/TIA (risk of having a stroke following a stroke or TIA is the same). Ongoing risk factor control by Primary Care Physician Stroke Service will sign off. Please call should any needs arise. Follow up with Dr. Pearlean Brownie, Stroke Clinic, in 2 months.  Dr. Pearlean Brownie discussed diagnosis, prognosis and plan of care with Dr. Gwenlyn Perking and patient.   Annie Main, MSN, RN, ANVP-BC, ANP-BC, GNP-BC [facility] Pager: [phone] 01/12/2013 10:00 AM  I have personally obtained a history, examined the patient, evaluated imaging results, and formulated the assessment and plan of care. I agree with the above.  Delia Heady, MD

## 2013-01-16 LAB — CULTURE, BLOOD (ROUTINE X 2): Culture: NO GROWTH

## 2013-01-18 ENCOUNTER — Other Ambulatory Visit (HOSPITAL_COMMUNITY): Payer: Self-pay | Admitting: Internal Medicine

## 2013-01-18 DIAGNOSIS — Z1231 Encounter for screening mammogram for malignant neoplasm of breast: Secondary | ICD-10-CM

## 2013-02-20 ENCOUNTER — Ambulatory Visit (HOSPITAL_COMMUNITY)
Admission: RE | Admit: 2013-02-20 | Discharge: 2013-02-20 | Disposition: A | Discharge status: Home / Self Care | Payer: 59 | Source: Ambulatory Visit | Attending: Internal Medicine | Admitting: Internal Medicine

## 2013-02-20 DIAGNOSIS — Z1231 Encounter for screening mammogram for malignant neoplasm of breast: Secondary | ICD-10-CM

## 2013-03-27 ENCOUNTER — Telehealth: Payer: Self-pay | Admitting: *Deleted

## 2013-04-03 ENCOUNTER — Ambulatory Visit: Payer: Self-pay | Admitting: Nurse Practitioner

## 2013-05-11 ENCOUNTER — Ambulatory Visit (INDEPENDENT_AMBULATORY_CARE_PROVIDER_SITE_OTHER): Payer: 59 | Admitting: Nurse Practitioner

## 2013-05-11 ENCOUNTER — Encounter: Payer: Self-pay | Admitting: Nurse Practitioner

## 2013-05-11 VITALS — BP 171/96 | HR 115 | Temp 98.8°F | Ht 62.0 in | Wt 155.0 lb

## 2013-05-11 DIAGNOSIS — I6350 Cerebral infarction due to unspecified occlusion or stenosis of unspecified cerebral artery: Secondary | ICD-10-CM

## 2013-05-11 DIAGNOSIS — E119 Type 2 diabetes mellitus without complications: Secondary | ICD-10-CM

## 2013-05-11 DIAGNOSIS — I10 Essential (primary) hypertension: Secondary | ICD-10-CM

## 2013-05-11 DIAGNOSIS — E785 Hyperlipidemia, unspecified: Secondary | ICD-10-CM

## 2013-05-11 DIAGNOSIS — I639 Cerebral infarction, unspecified: Secondary | ICD-10-CM

## 2013-05-11 NOTE — Patient Instructions (Addendum)
Continue aspirin 81 mg orally every day  for secondary stroke prevention and maintain strict control of hypertension with blood pressure goal below 130/90, diabetes with hemoglobin A1c goal below 6.5% and lipids with LDL cholesterol goal below 100 mg/dL.  Lipid panel to be checked every 6 months.  Please speak to Dr. Mathews Robinsons office for advice on healthy eating for diabetes.  They may have a nutritionist or be able to provide you with information.  Followup in 6 months.

## 2013-05-11 NOTE — Progress Notes (Signed)
[facility]  PATIENT: Dawn [name] DOB: [birth year]   HISTORY FROM: Patient, chart REASON FOR VISIT: Stroke hospital followup  HISTORY OF PRESENT ILLNESS:  UPDATE 05/11/13 (LL): Patient returns to office for followup for stroke that occurred on 01/09/2013. Patient states she is doing fairly well completed home physical therapy and occupational therapy and taking her medications every day. She states that she walks very slow now which is much different than before. 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 She states that there is no way she can do that anymore so she is filed for disability.  She is taking aspirin 81 mg daily for secondary stroke prophylaxis.  Patient denies medication side effects, with no signs of bleeding.  States she is changing the way she is eating, baking food instead of trying and eating a lot of fish.  She states she is home alone during the day and her husband works 2 jobs.    PRIOR HPI: Dawn [name] is an 62 y.o. female with a past medical history significant for hypertension, DM, admitted to [facility] on 3/24 due to confusion, decreased responsiveness, no feeling well. She recalls that she was kind of confused and her husband became concerned because she was less responsive than usual and was incontinent. However, she denies headache, vertigo, double vision, difficulty swallowing or speaking, slurred speech, language or vision impairment. The initial work up in the hospital was not particularly impressive but MRI-DWI completed revealed a small subacute appearing white matter infarcts in the right corona radiata. 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 Posterior circulation is okay. [name] reports gait difficulty that she described as " slow walking" over the past 2-3 months. In addition, relates mild  forgetfulness.No bladder incontinence.  REVIEW OF SYSTEMS: Full 14 system review of systems performed and notable only for: constitutional: Fatigue  cardiovascular: N/A respiratory: Short of breath endocrine: N/A  ear/nose/throat: N/A  Eyes: Blurred vision musculoskeletal: N/A skin: Moles genitourinary: N/A Gastrointestinal: N/A allergy/immunology: N/A neurological: Memory loss, confusion, numbness, weakness, dizziness sleep: Sleepiness psychiatric: N/A   ALLERGIES: Allergies  Allergen Reactions  . Penicillins Swelling    HOME MEDICATIONS: Outpatient Prescriptions Prior to Visit  Medication Sig Dispense Refill  . aspirin 81 MG chewable tablet Chew 1 tablet (81 mg total) by mouth daily.      Marland Kitchen gabapentin (NEURONTIN) 100 MG capsule Take 1 capsule (100 mg total) by mouth 3 (three) times daily.  90 capsule  1  . insulin detemir (LEVEMIR) 100 UNIT/ML injection Inject 0.22 mLs (22 Units total) into the skin 2 (two) times daily.      Marland Kitchen lisinopril (PRINIVIL,ZESTRIL) 5 MG tablet Take 1 tablet (5 mg total) by mouth daily.  30 tablet  1  . metFORMIN (GLUCOPHAGE) 1000 MG tablet Take 1,000 mg by mouth 2 (two) times daily with a meal.      . simvastatin (ZOCOR) 20 MG tablet Take 1 tablet (20 mg total) by mouth daily at 6 PM.  30 tablet  1  . sitaGLIPtin (JANUVIA) 50 MG tablet Take 50 mg by mouth 2 (two) times daily.       No facility-administered medications prior to visit.    PAST MEDICAL HISTORY: Past Medical History  Diagnosis Date  . Diabetes mellitus without complication   . Hypertension     PAST SURGICAL HISTORY: Past  Surgical History  Procedure Laterality Date  . No past surgeries      FAMILY HISTORY: No family history on file.  SOCIAL HISTORY: History   Social History  . Marital Status: Married    Spouse Name: N/A    Number of Children: N/A  . Years of Education: N/A   Occupational History  . Not on file.   Social History Main Topics  . Smoking status:  Never Smoker   . Smokeless tobacco: Never Used  . Alcohol Use: No  . Drug Use: No  . Sexually Active: Yes   Other Topics Concern  . Not on file   Social History Narrative  . No narrative on file     PHYSICAL EXAM  Filed Vitals:   05/11/13 1520  BP: 171/96  Pulse: 115  Temp: 98.8 F (37.1 C)  TempSrc: Oral  Height: 5\' 2"  (1.575 m)  Weight: 155 lb (70.308 kg)   Body mass index is 28.34 kg/(m^2).  Generalized: In no acute distress, pleasant middle-aged AA female.  Neck: Supple, no carotid bruits   Cardiac: Regular rate rhythm, no murmur   Pulmonary: Clear to auscultation bilaterally   Musculoskeletal: No deformity   Neurological examination   Mentation: Alert oriented to time, place, history taking, language fluent, and causual conversation  Cranial nerve II-XII: Pupils were equal round reactive to light extraocular movements were full, visual field were full on confrontational test. facial sensation and strength were normal. hearing was intact to finger rubbing bilaterally. Uvula tongue midline. head turning and shoulder shrug and were normal and symmetric.Tongue protrusion into cheek strength was normal. MILD LEFT LOWER FACIAL ASYMMETRY. MOTOR: normal bulk and tone, full strength in the BUE, BLE, fine finger movements DECREASED ON LEFT, no pronator drift SENSORY: normal and symmetric to light touch, pinprick, temperature, vibration and proprioception COORDINATION: finger-nose-finger, heel-to-shin bilaterally, there was no truncal ataxia REFLEXES: Brachioradialis 2/2, biceps 2/2, triceps 2/2, patellar 2/2, Achilles 2/2, plantar responses were flexor bilaterally. GAIT/STATION: Rising up from seated position without assistance, SLOW, STIFF STRIDE, UNABLE to perform tiptoe,  heel walking, or tandem without difficulty. Romberg positive.   DIAGNOSTIC DATA (LABS, IMAGING, TESTING) - I reviewed patient records, labs, notes, testing and imaging myself where available.  Lab  Results  Component Value Date   WBC 10.7* 01/10/2013   HGB 11.7* 01/10/2013   HCT 34.3* 01/10/2013   MCV 77.8* 01/10/2013   PLT 179 01/10/2013      Component Value Date/Time   NA 141 01/10/2013 0558   K 4.0 01/10/2013 0558   CL 104 01/10/2013 0558   CO2 26 01/10/2013 0558   GLUCOSE 218* 01/10/2013 0558   BUN 11 01/10/2013 0558   CREATININE 0.72 01/10/2013 0558   CALCIUM 8.7 01/10/2013 0558   PROT 8.2 01/09/2013 2142   ALBUMIN 3.9 01/09/2013 2142   AST 19 01/09/2013 2142   ALT 14 01/09/2013 2142   ALKPHOS 76 01/09/2013 2142   BILITOT 0.5 01/09/2013 2142   GFRNONAA >90 01/10/2013 0558   GFRAA >90 01/10/2013 0558   Lab Results  Component Value Date   CHOL 147 01/12/2013   HDL 23* 01/12/2013   LDLCALC 88 01/12/2013   TRIG 178* 01/12/2013   CHOLHDL 6.4 01/12/2013   Lab Results  Component Value Date   HGBA1C 8.8* 01/10/2013   No results found for this basename: [id number]   Lab Results  Component Value Date   TSH 0.282* 01/10/2013    MRI of the brain 01/11/2013 Small subacute appearing white  matter infarcts in the right corona radiata out a. No mass effect or hemorrhage. 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 Chronic ventriculomegaly, with suspected hyperdynamic flow in the cerebral aqueduct and a low level of transependymal edema. This might reflect chronic or congenital communicating hydrocephalus, and in the appropriate clinical setting normal pressure hydrocephalus could not be excluded.  MRA of the brain 01/11/2013 1. 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 There is moderate right ICA stenosis in the anterior genu. Chronic irregularity and 50 - 55% stenosis of the right ICA at the skull base. 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 The appearance is stable since 2010. Posterior circulation and other anterior circulation vessels are stable and within normal limits.  MRI Lumbar Spine 01/11/2013 Severe compression of the thecal sac at L3-4 due  to a combination of a 4 mm spondylolisthesis and broad-based disc bulge as well as a protrusion into the right neural foramen compressing the right L3 nerve. Compression of the left lateral recess and the left L5 nerve by a central disc protrusion and extrusion at L4-5.  2D Echocardiogram Severe LVH, EF 65-70% with no source of embolus.  Carotid Doppler No evidence of hemodynamically significant internal carotid artery stenosis. Vertebral artery flow is antegrade.  CXR 01/09/2013 Chronic lung changes but no acute pulmonary findings. Cholelithiasis.   ASSESSMENT AND PLAN Ms. Kyleigha Markert is a 62 y.o. female with small right corona radiata infarcts on 01/11/13. 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 Vascular risk factors include Hypertension, Diabetes, hyperlipidemia.  Continue aspirin 81 mg orally every day  for secondary stroke prevention and maintain strict control of hypertension with blood pressure goal below 130/90, diabetes with hemoglobin A1c goal below 7% and lipids with LDL cholesterol goal below 70 mg/dL.  Please speak to Dr. Mathews Robinsons office for advice on healthy eating for diabetes.  They may have a nutritionist or be able to provide you with information on free classes. Lipid panel to be checked every 6 months. Followup in 6 months.  Cariah Salatino NP-C 05/11/2013, 3:30 PM  [facility] [address] [place], [place] [address] [phone]  I have personally examined this patient, reviewed pertinent data, developed plan of care and discussed with patient and agree with above.  Delia Heady, MD

## 2013-05-29 DIAGNOSIS — Z0289 Encounter for other administrative examinations: Secondary | ICD-10-CM

## 2013-09-07 ENCOUNTER — Inpatient Hospital Stay (HOSPITAL_COMMUNITY)
Admission: EM | Admit: 2013-09-07 | Discharge: 2013-09-09 | DRG: 056 | Disposition: A | Discharge status: Home / Self Care | Payer: 59 | Attending: Internal Medicine | Admitting: Internal Medicine

## 2013-09-07 ENCOUNTER — Encounter (HOSPITAL_COMMUNITY): Payer: Self-pay | Admitting: Emergency Medicine

## 2013-09-07 ENCOUNTER — Emergency Department (HOSPITAL_COMMUNITY): Payer: 59

## 2013-09-07 DIAGNOSIS — Z8249 Family history of ischemic heart disease and other diseases of the circulatory system: Secondary | ICD-10-CM

## 2013-09-07 DIAGNOSIS — E119 Type 2 diabetes mellitus without complications: Secondary | ICD-10-CM | POA: Diagnosis present

## 2013-09-07 DIAGNOSIS — G9340 Encephalopathy, unspecified: Secondary | ICD-10-CM | POA: Diagnosis present

## 2013-09-07 DIAGNOSIS — R000 Tachycardia, unspecified: Secondary | ICD-10-CM | POA: Diagnosis present

## 2013-09-07 DIAGNOSIS — E1165 Type 2 diabetes mellitus with hyperglycemia: Secondary | ICD-10-CM | POA: Diagnosis present

## 2013-09-07 DIAGNOSIS — G459 Transient cerebral ischemic attack, unspecified: Secondary | ICD-10-CM

## 2013-09-07 DIAGNOSIS — Z794 Long term (current) use of insulin: Secondary | ICD-10-CM

## 2013-09-07 DIAGNOSIS — Z7982 Long term (current) use of aspirin: Secondary | ICD-10-CM

## 2013-09-07 DIAGNOSIS — N390 Urinary tract infection, site not specified: Secondary | ICD-10-CM | POA: Diagnosis present

## 2013-09-07 DIAGNOSIS — Z8673 Personal history of transient ischemic attack (TIA), and cerebral infarction without residual deficits: Secondary | ICD-10-CM

## 2013-09-07 DIAGNOSIS — G912 (Idiopathic) normal pressure hydrocephalus: Principal | ICD-10-CM | POA: Diagnosis present

## 2013-09-07 DIAGNOSIS — R4182 Altered mental status, unspecified: Secondary | ICD-10-CM | POA: Diagnosis present

## 2013-09-07 DIAGNOSIS — R32 Unspecified urinary incontinence: Secondary | ICD-10-CM

## 2013-09-07 DIAGNOSIS — I10 Essential (primary) hypertension: Secondary | ICD-10-CM | POA: Diagnosis present

## 2013-09-07 DIAGNOSIS — G919 Hydrocephalus, unspecified: Secondary | ICD-10-CM | POA: Diagnosis present

## 2013-09-07 LAB — CBC WITH DIFFERENTIAL/PLATELET
Basophils Absolute: 0 10*3/uL (ref 0.0–0.1)
Basophils Relative: 0 % (ref 0–1)
Eosinophils Absolute: 0 10*3/uL (ref 0.0–0.7)
Eosinophils Relative: 0 % (ref 0–5)
HCT: 35.9 % — ABNORMAL LOW (ref 36.0–46.0)
Hemoglobin: 12 g/dL (ref 12.0–15.0)
Lymphs Abs: 1.8 10*3/uL (ref 0.7–4.0)
MCHC: 33.4 g/dL (ref 30.0–36.0)
MCV: 79.2 fL (ref 78.0–100.0)
Monocytes Absolute: 1.2 10*3/uL — ABNORMAL HIGH (ref 0.1–1.0)
RDW: 12.6 % (ref 11.5–15.5)
WBC: 16.7 10*3/uL — ABNORMAL HIGH (ref 4.0–10.5)

## 2013-09-07 LAB — GLUCOSE, CAPILLARY: Glucose-Capillary: 242 mg/dL — ABNORMAL HIGH (ref 70–99)

## 2013-09-07 LAB — COMPREHENSIVE METABOLIC PANEL
Albumin: 4 g/dL (ref 3.5–5.2)
BUN: 26 mg/dL — ABNORMAL HIGH (ref 6–23)
CO2: 25 mEq/L (ref 19–32)
Chloride: 96 mEq/L (ref 96–112)
Creatinine, Ser: 1.2 mg/dL — ABNORMAL HIGH (ref 0.50–1.10)
GFR calc Af Amer: 56 mL/min — ABNORMAL LOW (ref >90)
Glucose, Bld: 294 mg/dL — ABNORMAL HIGH (ref 70–99)
Potassium: 3.7 mEq/L (ref 3.5–5.1)
Total Bilirubin: 0.7 mg/dL (ref 0.3–1.2)
Total Protein: 8.2 g/dL (ref 6.0–8.3)

## 2013-09-07 NOTE — ED Notes (Signed)
Spouse reported that pt. was confused this afternoon with stool incontinence and unsteady gait . Disoriented to time at triage , oriented to place and person , speech clear , no facial asymmetry , no arm drift.

## 2013-09-08 ENCOUNTER — Inpatient Hospital Stay (HOSPITAL_COMMUNITY): Payer: 59

## 2013-09-08 ENCOUNTER — Emergency Department (HOSPITAL_COMMUNITY): Payer: 59

## 2013-09-08 ENCOUNTER — Encounter (HOSPITAL_COMMUNITY): Payer: Self-pay | Admitting: Neurology

## 2013-09-08 DIAGNOSIS — G911 Obstructive hydrocephalus: Secondary | ICD-10-CM

## 2013-09-08 DIAGNOSIS — G919 Hydrocephalus, unspecified: Secondary | ICD-10-CM | POA: Diagnosis present

## 2013-09-08 DIAGNOSIS — R4182 Altered mental status, unspecified: Secondary | ICD-10-CM

## 2013-09-08 DIAGNOSIS — G912 (Idiopathic) normal pressure hydrocephalus: Secondary | ICD-10-CM

## 2013-09-08 DIAGNOSIS — N390 Urinary tract infection, site not specified: Secondary | ICD-10-CM | POA: Diagnosis present

## 2013-09-08 DIAGNOSIS — E119 Type 2 diabetes mellitus without complications: Secondary | ICD-10-CM

## 2013-09-08 DIAGNOSIS — R32 Unspecified urinary incontinence: Secondary | ICD-10-CM

## 2013-09-08 DIAGNOSIS — I10 Essential (primary) hypertension: Secondary | ICD-10-CM

## 2013-09-08 HISTORY — DX: Altered mental status, unspecified: R41.82

## 2013-09-08 HISTORY — DX: (Idiopathic) normal pressure hydrocephalus: G91.2

## 2013-09-08 LAB — LIPID PANEL
Cholesterol: 148 mg/dL (ref 0–200)
HDL: 33 mg/dL — ABNORMAL LOW (ref >39)
Total CHOL/HDL Ratio: 4.5 RATIO
Triglycerides: 83 mg/dL (ref <150)
VLDL: 17 mg/dL (ref 0–40)

## 2013-09-08 LAB — CBC WITH DIFFERENTIAL/PLATELET
Basophils Absolute: 0 10*3/uL (ref 0.0–0.1)
Basophils Relative: 0 % (ref 0–1)
Eosinophils Absolute: 0.1 10*3/uL (ref 0.0–0.7)
MCH: 26.7 pg (ref 26.0–34.0)
MCHC: 33.8 g/dL (ref 30.0–36.0)
Monocytes Relative: 8 % (ref 3–12)
Neutro Abs: 11 10*3/uL — ABNORMAL HIGH (ref 1.7–7.7)
Neutrophils Relative %: 69 % (ref 43–77)
Platelets: 237 10*3/uL (ref 150–400)
RDW: 12.7 % (ref 11.5–15.5)
WBC: 15.8 10*3/uL — ABNORMAL HIGH (ref 4.0–10.5)

## 2013-09-08 LAB — GLUCOSE, CAPILLARY
Glucose-Capillary: 165 mg/dL — ABNORMAL HIGH (ref 70–99)
Glucose-Capillary: 127 mg/dL — ABNORMAL HIGH (ref 70–99)
Glucose-Capillary: 262 mg/dL — ABNORMAL HIGH (ref 70–99)

## 2013-09-08 LAB — COMPREHENSIVE METABOLIC PANEL
AST: 21 U/L (ref 0–37)
Albumin: 3.5 g/dL (ref 3.5–5.2)
Alkaline Phosphatase: 79 U/L (ref 39–117)
BUN: 21 mg/dL (ref 6–23)
Chloride: 97 mEq/L (ref 96–112)
GFR calc Af Amer: 82 mL/min — ABNORMAL LOW (ref >90)
Glucose, Bld: 188 mg/dL — ABNORMAL HIGH (ref 70–99)
Potassium: 3.6 mEq/L (ref 3.5–5.1)
Sodium: 135 mEq/L (ref 135–145)
Total Protein: 8 g/dL (ref 6.0–8.3)

## 2013-09-08 LAB — URINE MICROSCOPIC-ADD ON

## 2013-09-08 LAB — SEDIMENTATION RATE: Sed Rate: 51 mm/hr — ABNORMAL HIGH (ref 0–22)

## 2013-09-08 LAB — URINALYSIS, ROUTINE W REFLEX MICROSCOPIC
Ketones, ur: NEGATIVE mg/dL
Nitrite: NEGATIVE
pH: 5 (ref 5.0–8.0)

## 2013-09-08 LAB — RPR: RPR Ser Ql: NONREACTIVE

## 2013-09-08 LAB — PROTIME-INR: Prothrombin Time: 14.8 seconds (ref 11.6–15.2)

## 2013-09-08 MED ORDER — SIMVASTATIN 20 MG PO TABS
20.0000 mg | ORAL_TABLET | Freq: Every day | ORAL | Status: DC
  Administered 2013-09-08 – 2013-09-09 (×2): 20 mg via ORAL
  Filled 2013-09-08 (×2): qty 1

## 2013-09-08 MED ORDER — CIPROFLOXACIN IN D5W 400 MG/200ML IV SOLN
400.0000 mg | Freq: Once | INTRAVENOUS | Status: AC
  Administered 2013-09-08: 400 mg via INTRAVENOUS
  Filled 2013-09-08: qty 200

## 2013-09-08 MED ORDER — INFLUENZA VAC SPLIT QUAD 0.5 ML IM SUSP
0.5000 mL | INTRAMUSCULAR | Status: AC
  Administered 2013-09-09: 0.5 mL via INTRAMUSCULAR
  Filled 2013-09-08: qty 0.5

## 2013-09-08 MED ORDER — INSULIN DETEMIR 100 UNIT/ML ~~LOC~~ SOLN
18.0000 [IU] | Freq: Two times a day (BID) | SUBCUTANEOUS | Status: DC
  Administered 2013-09-08 – 2013-09-09 (×3): 18 [IU] via SUBCUTANEOUS
  Filled 2013-09-08 (×4): qty 0.18

## 2013-09-08 MED ORDER — AMLODIPINE BESYLATE 10 MG PO TABS
10.0000 mg | ORAL_TABLET | Freq: Every day | ORAL | Status: DC
  Administered 2013-09-08 – 2013-09-09 (×2): 10 mg via ORAL
  Filled 2013-09-08 (×2): qty 1

## 2013-09-08 MED ORDER — GABAPENTIN 100 MG PO CAPS
100.0000 mg | ORAL_CAPSULE | Freq: Three times a day (TID) | ORAL | Status: DC
  Administered 2013-09-08 – 2013-09-09 (×5): 100 mg via ORAL
  Filled 2013-09-08 (×6): qty 1

## 2013-09-08 MED ORDER — INSULIN ASPART 100 UNIT/ML ~~LOC~~ SOLN
0.0000 [IU] | Freq: Three times a day (TID) | SUBCUTANEOUS | Status: DC
  Administered 2013-09-08: 2 [IU] via SUBCUTANEOUS
  Administered 2013-09-08 – 2013-09-09 (×3): 3 [IU] via SUBCUTANEOUS

## 2013-09-08 MED ORDER — CIPROFLOXACIN IN D5W 400 MG/200ML IV SOLN
400.0000 mg | Freq: Two times a day (BID) | INTRAVENOUS | Status: DC
  Administered 2013-09-08 – 2013-09-09 (×2): 400 mg via INTRAVENOUS
  Filled 2013-09-08 (×2): qty 200

## 2013-09-08 MED ORDER — ASPIRIN 81 MG PO CHEW
81.0000 mg | CHEWABLE_TABLET | Freq: Every day | ORAL | Status: DC
Start: 2013-09-08 — End: 2013-09-08

## 2013-09-08 MED ORDER — INSULIN ASPART 100 UNIT/ML ~~LOC~~ SOLN
0.0000 [IU] | Freq: Every day | SUBCUTANEOUS | Status: DC
Start: 2013-09-08 — End: 2013-09-09
  Administered 2013-09-08: 3 [IU] via SUBCUTANEOUS

## 2013-09-08 MED ORDER — SODIUM CHLORIDE 0.9 % IV SOLN
INTRAVENOUS | Status: DC
  Administered 2013-09-08 (×2): via INTRAVENOUS

## 2013-09-08 MED ORDER — ENOXAPARIN SODIUM 40 MG/0.4ML ~~LOC~~ SOLN
40.0000 mg | SUBCUTANEOUS | Status: DC
  Filled 2013-09-08: qty 0.4

## 2013-09-08 NOTE — Progress Notes (Signed)
Utilization review completed. Bertha Stanfill, RN, BSN. 

## 2013-09-08 NOTE — Progress Notes (Addendum)
Physical Therapy Treatment Patient Details Name: Dawn [name] MRN: [id number] DOB: [birth year] Today's Date: 09/08/2013 Time: 3086-5784 PT Time Calculation (min): 43 min  PT Assessment / Plan / Recommendation  History of Present Illness pt presents with AMS and r/o CVA vs NPH.     PT Comments   Performed post-LP assessment. Primary results include:  Mini-Mental - 18/30 pre increased to 22/30 post (min change) TUG balance test - 47.71 seconds pre increased to 23.44 seconds post (significant change with improved balance) 180 degree turn - 9 steps pre to 7 steps post (min change) 360 degree turn - 18 steps pre to 17 steps post (min change) Finger to nose x10: RUE - 21.04 seconds pre to 17.28 post; LUE - 19.64 seconds pre to 13.07 seconds post Heel slides x10: RLE - 17.08 seconds pre to 17.73 sec post; LLE - 20.52 seconds pre to 16.01 seconds post  Overall result:  Showed most improvement in balance.  Gait less unsteady, but continues to needs assist for safety.  Cognition with minimal change - continued to struggle with Mini-Mental exam post LP.   Follow Up Recommendations  SNF     Does the patient have the potential to tolerate intense rehabilitation     Barriers to Discharge        Equipment Recommendations  Rolling walker with 5" wheels    Recommendations for Other Services    Frequency Min 4X/week   Progress towards PT Goals Progress towards PT goals: Progressing toward goals  Plan Current plan remains appropriate    Precautions / Restrictions Precautions Precautions: Fall Restrictions Weight Bearing Restrictions: No   Pertinent Vitals/Pain     Mobility  Bed Mobility Bed Mobility: Rolling Left;Left Sidelying to Sit;Sitting - Scoot to Edge of Bed;Sit to Supine Rolling Left: 5: Supervision;With rail Left Sidelying to Sit: 4: Min assist;With rails Sit to Supine: 4: Min assist Details for Bed Mobility Assistance: Verbal cues for technique, using rail.  Needed repeated  cuing to reach with RUE for rail to roll left.  Assist to raise trunk to sitting position.  Assist to bring LE's onto bed to return to supine. Transfers Transfers: Sit to Stand;Stand to Sit Sit to Stand: 4: Min guard;With upper extremity assist;From bed;From toilet;With armrests;From chair/3-in-1 Stand to Sit: 4: Min guard;With upper extremity assist;With armrests;To chair/3-in-1;To toilet;To bed Details for Transfer Assistance: Assist for safety only. Ambulation/Gait Ambulation/Gait Assistance: 4: Min guard Ambulation Distance (Feet): 40 Feet Assistive device: None Ambulation/Gait Assistance Details: Patient reaching for sink during gait.  Balance somewhat improved. Gait Pattern: Step-through pattern;Decreased stride length;Trunk flexed;Shuffle Gait velocity: Slow gait speed Modified Rankin (Stroke Patients Only) Pre-Morbid Rankin Score: No significant disability Modified Rankin: Moderately severe disability    Exercises Other Exercises Other Exercises: 180 degree turn in 7 steps Other Exercises: 360 degree turn in 17 steps Other Exercises: Finger-to-nose x10:  RUE-17.28 seconds; LUE-13.07 seconds Other Exercises: Heel slides x10:  RLE 17.73 seconds; LLE- 16.013 seconds     PT Goals (current goals can now be found in the care plan section)    Visit Information  Last PT Received On: 09/08/13 Assistance Needed: +1 History of Present Illness: pt presents with AMS and r/o CVA vs NPH.      Subjective Data  Subjective: Asked patient if she "felt different" after the procedure.  She states "no"   Cognition  Cognition Arousal/Alertness: Awake/alert Behavior During Therapy: WFL for tasks assessed/performed Overall Cognitive Status: Impaired/Different from baseline Area of Impairment: Orientation;Attention;Memory;Following commands;Safety/judgement;Awareness;Problem solving Orientation Level:  Disoriented to;Time;Situation Current Attention Level: Sustained Memory: Decreased  short-term memory Following Commands: Follows one step commands with increased time Safety/Judgement: Decreased awareness of safety Problem Solving: Slow processing;Decreased initiation;Requires verbal cues;Requires tactile cues General Comments: Patient scored 22/30 on Mini-Mental test.  Patient easily distracted by activity in room.  Needs redirection to task frequently.    Balance  Balance Balance Assessed: Yes Standardized Balance Assessment Standardized Balance Assessment: Timed Up and Go Test Timed Up and Go Test TUG: Normal TUG Normal TUG (seconds): 23.44  End of Session PT - End of Session Equipment Utilized During Treatment: Gait belt Activity Tolerance: Patient tolerated treatment well Patient left: in bed;with call bell/phone within reach Nurse Communication: Mobility status   GP     Dawn [name] 09/08/2013, 5:51 PM Durenda Hurt. Renaldo Fiddler, [facility] Acute [facility]hab Services Pager [phone]

## 2013-09-08 NOTE — Progress Notes (Signed)
Physical Therapy Treatment Patient Details Name: Dawn [name] MRN: [id number] DOB: 14-Jul-1951 Today's Date: 09/08/2013 Time: 4098-1191 PT Time Calculation (min): 34 min  PT Assessment / Plan / Recommendation  History of Present Illness pt presents with AMS and r/o CVA vs NPH.     PT Comments   Performed pre-LP assessment.  Primary results include: Mini-Mental - 18/30 TUG balance test - 47.71 seconds 180 degree turn - 9 steps 360 degree turn - 18 steps Finger to nose x10:  RUE - 21.04 seconds; LUE - 19.64 seconds Heel slides x10:  RLE - 17.08 seconds; LLE - 20.52 seconds  Will complete f/u at 1 hour following LP procedure.   Follow Up Recommendations  SNF     Does the patient have the potential to tolerate intense rehabilitation     Barriers to Discharge        Equipment Recommendations  Rolling walker with 5" wheels    Recommendations for Other Services    Frequency Min 4X/week   Progress towards PT Goals Progress towards PT goals: Progressing toward goals  Plan Current plan remains appropriate    Precautions / Restrictions Precautions Precautions: Fall Restrictions Weight Bearing Restrictions: No   Pertinent Vitals/Pain     Mobility  Transfers Transfers: Sit to Stand;Stand to Sit Sit to Stand: 4: Min guard;With upper extremity assist;With armrests;From chair/3-in-1 Stand to Sit: 4: Min guard;With upper extremity assist;With armrests;To chair/3-in-1 Details for Transfer Assistance: Assist for safety/balance. Modified Rankin (Stroke Patients Only) Pre-Morbid Rankin Score: No significant disability Modified Rankin: Moderately severe disability    Exercises Other Exercises Other Exercises: 180 degree turn in 9 steps Other Exercises: 360 degree turn in 18 steps Other Exercises: Finger-to-nose x10:  RUE-21.04 seconds; LUE-19.64 seconds Other Exercises: Heel slides x10:  RLE 17.08 seconds; LLE- 20.52 seconds     PT Goals (current goals can now be found in the  care plan section)    Visit Information  Last PT Received On: 09/08/13 Assistance Needed: +1 History of Present Illness: pt presents with AMS and r/o CVA vs NPH.      Subjective Data  Subjective: "I have to go to the bathroom"  "They are hurrying these tests"   Cognition  Cognition Arousal/Alertness: Awake/alert Behavior During Therapy: Flat affect Overall Cognitive Status: Impaired/Different from baseline Area of Impairment: Orientation;Attention;Memory;Following commands;Safety/judgement;Awareness;Problem solving Orientation Level: Disoriented to;Time;Situation Current Attention Level: Sustained Memory: Decreased short-term memory Following Commands: Follows one step commands with increased time Safety/Judgement: Decreased awareness of safety Problem Solving: Slow processing;Decreased initiation;Requires verbal cues;Requires tactile cues General Comments: Patient scored 18/30 on Mini-Mental test    Balance  Balance Balance Assessed: Yes Standardized Balance Assessment Standardized Balance Assessment: Timed Up and Go Test Timed Up and Go Test TUG: Normal TUG Normal TUG (seconds): 47.71  End of Session PT - End of Session Equipment Utilized During Treatment: Gait belt Activity Tolerance: Patient tolerated treatment well Patient left: in bed;with call bell/phone within reach (to go to EEG) Nurse Communication: Mobility status   GP     Vena Austria 09/08/2013, 2:50 PM Durenda Hurt. Renaldo Fiddler, [facility] Acute Rehab [facility] Pager [phone]

## 2013-09-08 NOTE — Progress Notes (Signed)
Patient seen and examined this morning. Agree with H&P. Appreciate Neurology input.   Costin M. Elvera Lennox, MD [facility] [phone]

## 2013-09-08 NOTE — Significant Event (Signed)
Rapid Response Event Note Called to see pt for change in neuro status Overview: Time Called: 2000 Arrival Time: 2003 Event Type: Neurologic  Initial Focused Assessment: On arrival pt is resting in bed with HOB flat and her husband at bedside.  Pt is now more alert and responsive and able to say who & where she is as well as the month.  Pt is slightly weaker on the right but that is not a change from admission per her husband.  Discussed pt's current condition with her nurse & she will cont. To monitor the pt. As ordered  Interventions: None  Event Summary:   at      at          [facility], Dawn [name]

## 2013-09-08 NOTE — ED Provider Notes (Signed)
CSN: [id number]     Arrival date & time 09/07/13  2107 History   First MD Initiated Contact with Patient 09/07/13 2342     Chief Complaint  Patient presents with  . Altered Mental Status  . Encopresis   (Consider location/radiation/quality/duration/timing/severity/associated sxs/prior Treatment) Patient is a 62 y.o. female presenting with altered mental status. The history is provided by the spouse.  Altered Mental Status Presenting symptoms: behavior changes, confusion and disorientation   Severity:  Moderate Most recent episode:  Yesterday Episode history:  Single Timing:  Constant Progression:  Unchanged Chronicity:  Recurrent Context: not alcohol use and not a recent infection   Associated symptoms: bladder incontinence   Associated symptoms: no abdominal pain, no difficulty breathing, no fever, no nausea, no rash, no slurred speech and no vomiting     Past Medical History  Diagnosis Date  . Diabetes mellitus without complication   . Hypertension   . Stroke 01/11/13    right corona radiata infarct   Past Surgical History  Procedure Laterality Date  . No past surgeries     No family history on file. History  Substance Use Topics  . Smoking status: Never Smoker   . Smokeless tobacco: Never Used  . Alcohol Use: No   OB History   Grav Para Term Preterm Abortions TAB SAB Ect Mult Living                 Review of Systems  Constitutional: Negative for fever.  HENT: Negative for drooling.   Respiratory: Negative for cough.   Gastrointestinal: Negative for nausea, vomiting and abdominal pain.  Genitourinary: Positive for bladder incontinence.  Skin: Negative for rash.  Psychiatric/Behavioral: Positive for confusion.  All other systems reviewed and are negative.    Allergies  Penicillins  Home Medications   Current Outpatient Rx  Name  Route  Sig  Dispense  Refill  . amLODipine (NORVASC) 10 MG tablet   Oral   Take 10 mg by mouth daily.          Marland Kitchen  aspirin 81 MG chewable tablet   Oral   Chew 1 tablet (81 mg total) by mouth daily.         Marland Kitchen BENICAR HCT 20-12.5 MG per tablet   Oral   Take 1 tablet by mouth daily.          Marland Kitchen gabapentin (NEURONTIN) 100 MG capsule   Oral   Take 1 capsule (100 mg total) by mouth 3 (three) times daily.   90 capsule   1   . insulin detemir (LEVEMIR) 100 UNIT/ML injection   Subcutaneous   Inject 18 Units into the skin 2 (two) times daily.         . metFORMIN (GLUCOPHAGE) 1000 MG tablet   Oral   Take 1,000 mg by mouth 2 (two) times daily with a meal.         . simvastatin (ZOCOR) 20 MG tablet   Oral   Take 1 tablet (20 mg total) by mouth daily at 6 PM.   30 tablet   1    BP 151/79  Pulse 107  Temp(Src) 98.6 F (37 C) (Oral)  Resp 18  Ht 5\' 2"  (1.575 m)  Wt 155 lb 4.8 oz (70.444 kg)  BMI 28.40 kg/m2  SpO2 99% Physical Exam  Constitutional: She appears well-developed and well-nourished. No distress.  HENT:  Head: Normocephalic and atraumatic.  Right Ear: External ear normal.  Left Ear: External ear normal.  Mouth/Throat: Oropharynx is clear and moist.  Eyes: Conjunctivae and EOM are normal. Pupils are equal, round, and reactive to light.  Neck: Normal range of motion. Neck supple.  Cardiovascular: Normal rate, regular rhythm and intact distal pulses.   Pulmonary/Chest: Effort normal and breath sounds normal. She has no wheezes. She has no rales.  Abdominal: Soft. Bowel sounds are normal. There is no tenderness. There is no rebound and no guarding.  Musculoskeletal: Normal range of motion. She exhibits no edema and no tenderness.  Neurological: She is alert. She has normal reflexes. No cranial nerve deficit.  Skin: Skin is warm and dry.  Psychiatric: She has a normal mood and affect.    ED Course  Procedures (including critical care time) Labs Review Labs Reviewed  CBC WITH DIFFERENTIAL - Abnormal; Notable for the following:    WBC 16.7 (*)    HCT 35.9 (*)     Neutrophils Relative % 82 (*)    Neutro Abs 13.7 (*)    Lymphocytes Relative 11 (*)    Monocytes Absolute 1.2 (*)    All other components within normal limits  GLUCOSE, CAPILLARY - Abnormal; Notable for the following:    Glucose-Capillary 303 (*)    All other components within normal limits  COMPREHENSIVE METABOLIC PANEL - Abnormal; Notable for the following:    Glucose, Bld 294 (*)    BUN 26 (*)    Creatinine, Ser 1.20 (*)    GFR calc non Af Amer 48 (*)    GFR calc Af Amer 56 (*)    All other components within normal limits  GLUCOSE, CAPILLARY - Abnormal; Notable for the following:    Glucose-Capillary 242 (*)    All other components within normal limits  URINALYSIS, ROUTINE W REFLEX MICROSCOPIC   Imaging Review Ct Head (brain) Wo Contrast  09/08/2013   CLINICAL DATA:  Altered mental status  EXAM: CT HEAD WITHOUT CONTRAST  TECHNIQUE: Contiguous axial images were obtained from the base of the skull through the vertex without intravenous contrast.  COMPARISON:  01/11/2013 brain MRI  FINDINGS: Skull and Sinuses:No significant abnormality.  Orbits: No acute abnormality.  Brain: No evidence of acute hemorrhage, infarct, mass lesion, or shift. Ventriculomegaly out of proportion to cerebral atrophy/sulcal enlargement, similar to previous imaging. There is deep cerebral white matter low-attenuation, also likely stable from previous brain MR imaging. Intracranial calcific atherosclerosis.  IMPRESSION: 1.  No acute intracranial findings. 2. 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   Electronically Signed   By: Tiburcio Pea M.D.   On: 09/08/2013 00:17    EKG Interpretation   None       MDM  No diagnosis found.  Date: 09/08/2013  Rate: 108  Rhythm: sinus tachycardia  QRS Axis: left  Intervals: normal  ST/T Wave abnormalities: normal  Conduction Disutrbances:left anterior fascicular block  Narrative Interpretation:   Old EKG  Reviewed: none available      Angelika Jerrett K Velera [place]-Rasch, MD 09/08/13 [phone]

## 2013-09-08 NOTE — Evaluation (Signed)
Physical Therapy Evaluation Patient Details Name: Dawn [name] MRN: [id number] DOB: 10/21/1950 Today's Date: 09/08/2013 Time: 5621-3086 PT Time Calculation (min): 19 min  PT Assessment / Plan / Recommendation History of Present Illness  pt presents with AMS and r/o CVA vs NPH.    Clinical Impression  Pt mobility mostly limited by cognition and requiring cues to re-attend to task and for safety.  Unclear level of A available at home, but on old notes from previous admission it states pt is alone ~4hrs each day while husband is at work and father-in-law is at [employer].  Will continue to follow.      PT Assessment  Patient needs continued PT services    Follow Up Recommendations  SNF    Does the patient have the potential to tolerate intense rehabilitation      Barriers to Discharge        Equipment Recommendations  Rolling walker with 5" wheels    Recommendations for Other Services     Frequency Min 4X/week    Precautions / Restrictions Precautions Precautions: Fall Restrictions Weight Bearing Restrictions: No   Pertinent Vitals/Pain Denied pain.        Mobility  Bed Mobility Bed Mobility: Supine to Sit;Sitting - Scoot to Edge of Bed Supine to Sit: 4: Min assist;HOB elevated Sitting - Scoot to [place] of Bed: 2: Max assist Details for Bed Mobility Assistance: A to bring trunk up to sit and utilized pad to bring hips to EOB.   Transfers Transfers: Sit to Stand;Stand to [employer] Transfers Sit to Stand: 4: Min assist;With upper extremity assist;From bed;From chair/3-in-1 Stand to Sit: 4: Min assist;With upper extremity assist;To chair/3-in-1;To bed Stand Pivot Transfers: 4: Min assist;With armrests Details for Transfer Assistance: cues for initiation and use of armrests.   Ambulation/Gait Ambulation/Gait Assistance: 4: Min assist Ambulation Distance (Feet): 10 Feet Assistive device: 1 person hand held assist Ambulation/Gait Assistance Details: pt moves very  slowly and needed cues to re-attend to task as pt would just stop and stand after ~40min.   Gait Pattern: Step-through pattern;Decreased stride length;Lateral trunk lean to right;Trunk flexed Stairs: No Wheelchair Mobility Wheelchair Mobility: No Modified Rankin (Stroke Patients Only) Pre-Morbid Rankin Score: No significant disability Modified Rankin: Moderately severe disability    Exercises     PT Diagnosis: Difficulty walking;Generalized weakness  PT Problem List: Decreased strength;Decreased activity tolerance;Decreased balance;Decreased mobility;Decreased coordination;Decreased cognition;Decreased knowledge of use of DME;Decreased safety awareness PT Treatment Interventions: DME instruction;Gait training;Stair training;Functional mobility training;Therapeutic activities;Therapeutic exercise;Balance training;Neuromuscular re-education;Patient/family education;Cognitive remediation     PT Goals(Current goals can be found in the care plan section) Acute Rehab PT Goals Patient Stated Goal: None stated.   PT Goal Formulation: With patient Time For Goal Achievement: 09/22/13 Potential to Achieve Goals: Good  Visit Information  Last PT Received On: 09/08/13 Assistance Needed: +1 History of Present Illness: pt presents with AMS and r/o CVA vs NPH.         Prior Functioning  Home Living Family/patient expects to be discharged to:: Skilled nursing facility Living Arrangements: Spouse/significant other Additional Comments: Per old chart pt's husband works and has his father stay with pt some of the day, but pt is alone for about 4hrs every day.   Prior Function Level of Independence: Independent Communication Communication: No difficulties Dominant Hand: Right    Cognition  Cognition Arousal/Alertness: Awake/alert Behavior During Therapy: Flat affect Overall Cognitive Status: Impaired/Different from baseline Area of Impairment: Orientation;Attention;Memory;Following  commands;Safety/judgement;Awareness;Problem solving Orientation Level: Disoriented to;Time (didn't know  year.  ) Current Attention Level: Sustained Memory: Decreased short-term memory Following Commands: Follows one step commands with increased time Safety/Judgement: Decreased awareness of safety Awareness: Anticipatory Problem Solving: Slow processing;Decreased initiation;Requires verbal cues;Requires tactile cues    Extremity/Trunk Assessment Upper Extremity Assessment Upper Extremity Assessment: Defer to OT evaluation Lower Extremity Assessment Lower Extremity Assessment: Generalized weakness   Balance Balance Balance Assessed: No  End of Session PT - End of Session Equipment Utilized During Treatment: Gait belt Activity Tolerance: Patient tolerated treatment well Patient left: in chair;with call bell/phone within reach Nurse Communication: Mobility status  GP     Sunny Schlein, [place] [phone] 09/08/2013, 8:47 AM

## 2013-09-08 NOTE — Progress Notes (Signed)
eeg completed, results pending

## 2013-09-08 NOTE — Procedures (Signed)
Successful lumbar puncture and therapeutic drainage (approximately 30 mls). Opening pressure 16 cm H20, closing pressure 5 cm H2O.

## 2013-09-08 NOTE — Consult Note (Addendum)
NEURO HOSPITALIST CONSULT NOTE    Reason for Consult: NPH  HPI:                                                                                                                                          Dawn [name] is an 62 y.o. female who has been having progressive mental decline, urinary incontinence and gait difficulty of over one month.  Patient states she has never driven, does not cook for her self or husband, husband soes billing, has not had difficulty recalling recipes "because she does not cook.  She admits to having progressive problems holing her urine.  She knows she has to urinate and at times can hold it for a little while but is unable to get to the toilet.  She denies falling but has notice she is more off balance.  When asked about her memory, initially she stated she has no issues but then admitted to having increased difficulty recalling recent events.  She knows she is in the hospital and the month is November (needed to recite all the months prior to getting to that answer) but not the year, city, state, floor.  She can only state 2 animals in one minute, cannot recall three objects or do serial 7's.   Past Medical History  Diagnosis Date  . Diabetes mellitus without complication   . Hypertension   . Stroke 01/11/13    right corona radiata infarct    Past Surgical History  Procedure Laterality Date  . No past surgeries      Family History  Problem Relation Age of Onset  . Hypertension Mother   . Hypertension Father      Social History:  reports that she has never smoked. She has never used smokeless tobacco. She reports that she does not drink alcohol or use illicit drugs.  Allergies  Allergen Reactions  . Penicillins Swelling    MEDICATIONS:                                                                                                                     Scheduled: . amLODipine  10 mg Oral Daily  . ciprofloxacin  400 mg Intravenous  Q12H  . gabapentin  100 mg Oral TID  . [START ON 09/09/2013]  influenza vac split quadrivalent PF  0.5 mL Intramuscular Tomorrow-1000  . insulin aspart  0-15 Units Subcutaneous TID WC  . insulin aspart  0-5 Units Subcutaneous QHS  . insulin detemir  18 Units Subcutaneous BID  . simvastatin  20 mg Oral q1800     ROS:                                                                                                                                       History obtained from the patient  General ROS: negative for - chills, fatigue, fever, night sweats, weight gain or weight loss Psychological ROS: negative for - behavioral disorder, hallucinations, memory difficulties, mood swings or suicidal ideation Ophthalmic ROS: negative for - blurry vision, double vision, eye pain or loss of vision ENT ROS: negative for - epistaxis, nasal discharge, oral lesions, sore throat, tinnitus or vertigo Allergy and Immunology ROS: negative for - hives or itchy/watery eyes Hematological and Lymphatic ROS: negative for - bleeding problems, bruising or swollen lymph nodes Endocrine ROS: negative for - galactorrhea, hair pattern changes, polydipsia/polyuria or temperature intolerance Respiratory ROS: negative for - cough, hemoptysis, shortness of breath or wheezing Cardiovascular ROS: negative for - chest pain, dyspnea on exertion, edema or irregular heartbeat Gastrointestinal ROS: negative for - abdominal pain, diarrhea, hematemesis, nausea/vomiting or stool incontinence Genito-Urinary ROS: negative for - dysuria, hematuria, incontinence or urinary frequency/urgency Musculoskeletal ROS: negative for - joint swelling or muscular weakness Neurological ROS: as noted in HPI Dermatological ROS: negative for rash and skin lesion changes   Blood pressure 152/75, pulse 103, temperature 98.6 F (37 C), temperature source Oral, resp. rate 18, height 5\' 1"  (1.549 m), weight 71.85 kg (158 lb 6.4 oz), SpO2  95.00%.   Neurologic Examination:                                                                                                      Mental Status: Alert, oriented to hospital, thought content appropriate.  Speech fluent without evidence of aphasia.  Able to follow 3 step commands without difficulty. Cranial Nerves: II: Discs flat bilaterally; Visual fields grossly normal, pupils equal, round, reactive to light and accommodation III,IV, VI: ptosis not present, extra-ocular motions intact bilaterally V,VII: smile symmetric, facial light touch sensation normal bilaterally VIII: hearing normal bilaterally IX,X: gag reflex present XI: bilateral shoulder shrug XII: midline tongue extension without atrophy or fasciculations  Motor: Right : Upper extremity   5/5    Left:  Upper extremity   5/5  Lower extremity   5/5     Lower extremity   5/5 Tone and bulk:normal tone throughout; no atrophy noted Sensory: Pinprick and light touch intact throughout, bilaterally Deep Tendon Reflexes:  Right: Upper Extremity   Left: Upper extremity   biceps (C-5 to C-6) 2/4   biceps (C-5 to C-6) 2/4 tricep (C7) 2/4    triceps (C7) 2/4 Brachioradialis (C6) 2/4  Brachioradialis (C6) 2/4  Lower Extremity Lower Extremity  quadriceps (L-2 to L-4) 2/4   quadriceps (L-2 to L-4) 2/4 Achilles (S1) 0/4   Achilles (S1) 0/4  Plantars: Right: downgoing   Left: downgoing Cerebellar: normal finger-to-nose,  normal heel-to-shin test Gait: wide based, shuffling CV: pulses palpable throughout    Lab Results  Component Value Date/Time   CHOL 148 09/08/2013  6:00 AM    Results for orders placed during the hospital encounter of 09/07/13 (from the past 48 hour(s))  GLUCOSE, CAPILLARY     Status: Abnormal   Collection Time    09/07/13  9:32 PM      Result Value Range   Glucose-Capillary 303 (*) 70 - 99 mg/dL  CBC WITH DIFFERENTIAL     Status: Abnormal   Collection Time    09/07/13  9:40 PM      Result Value  Range   WBC 16.7 (*) 4.0 - 10.5 K/uL   RBC 4.53  3.87 - 5.11 MIL/uL   Hemoglobin 12.0  12.0 - 15.0 g/dL   HCT 16.1 (*) 09.6 - 04.5 %   MCV 79.2  78.0 - 100.0 fL   MCH 26.5  26.0 - 34.0 pg   MCHC 33.4  30.0 - 36.0 g/dL   RDW 40.9  81.1 - 91.4 %   Platelets 269  150 - 400 K/uL   Neutrophils Relative % 82 (*) 43 - 77 %   Neutro Abs 13.7 (*) 1.7 - 7.7 K/uL   Lymphocytes Relative 11 (*) 12 - 46 %   Lymphs Abs 1.8  0.7 - 4.0 K/uL   Monocytes Relative 7  3 - 12 %   Monocytes Absolute 1.2 (*) 0.1 - 1.0 K/uL   Eosinophils Relative 0  0 - 5 %   Eosinophils Absolute 0.0  0.0 - 0.7 K/uL   Basophils Relative 0  0 - 1 %   Basophils Absolute 0.0  0.0 - 0.1 K/uL  COMPREHENSIVE METABOLIC PANEL     Status: Abnormal   Collection Time    09/07/13  9:40 PM      Result Value Range   Sodium 135  135 - 145 mEq/L   Potassium 3.7  3.5 - 5.1 mEq/L   Chloride 96  96 - 112 mEq/L   CO2 25  19 - 32 mEq/L   Glucose, Bld 294 (*) 70 - 99 mg/dL   BUN 26 (*) 6 - 23 mg/dL   Creatinine, Ser 7.82 (*) 0.50 - 1.10 mg/dL   Calcium 9.2  8.4 - 95.6 mg/dL   Total Protein 8.2  6.0 - 8.3 g/dL   Albumin 4.0  3.5 - 5.2 g/dL   AST 15  0 - 37 U/L   ALT 11  0 - 35 U/L   Alkaline Phosphatase 72  39 - 117 U/L   Total Bilirubin 0.7  0.3 - 1.2 mg/dL   GFR calc non Af Amer 48 (*) >90 mL/min   GFR calc Af Amer 56 (*) >90 mL/min   Comment: (NOTE)  The eGFR has been calculated using the CKD EPI equation.     This calculation has not been validated in all clinical situations.     eGFR's persistently <90 mL/min signify possible Chronic Kidney     Disease.  GLUCOSE, CAPILLARY     Status: Abnormal   Collection Time    09/07/13 11:47 PM      Result Value Range   Glucose-Capillary 242 (*) 70 - 99 mg/dL  URINALYSIS, ROUTINE W REFLEX MICROSCOPIC     Status: Abnormal   Collection Time    09/08/13  1:08 AM      Result Value Range   Color, Urine YELLOW  YELLOW   APPearance CLEAR  CLEAR   Specific Gravity, Urine 1.007  1.005 -  1.030   pH 5.0  5.0 - 8.0   Glucose, UA NEGATIVE  NEGATIVE mg/dL   Hgb urine dipstick TRACE (*) NEGATIVE   Bilirubin Urine NEGATIVE  NEGATIVE   Ketones, ur NEGATIVE  NEGATIVE mg/dL   Protein, ur NEGATIVE  NEGATIVE mg/dL   Urobilinogen, UA 0.2  0.0 - 1.0 mg/dL   Nitrite NEGATIVE  NEGATIVE   Leukocytes, UA MODERATE (*) NEGATIVE  URINE MICROSCOPIC-ADD ON     Status: None   Collection Time    09/08/13  1:08 AM      Result Value Range   Squamous Epithelial / LPF RARE  RARE   WBC, UA 7-10  <3 WBC/hpf   RBC / HPF 0-2  <3 RBC/hpf  POCT I-STAT TROPONIN I     Status: None   Collection Time    09/08/13  2:05 AM      Result Value Range   Troponin i, poc 0.02  0.00 - 0.08 ng/mL   Comment 3            Comment: Due to the release kinetics of cTnI,     a negative result within the first hours     of the onset of symptoms does not rule out     myocardial infarction with certainty.     If myocardial infarction is still suspected,     repeat the test at appropriate intervals.  LIPID PANEL     Status: Abnormal   Collection Time    09/08/13  6:00 AM      Result Value Range   Cholesterol 148  0 - 200 mg/dL   Triglycerides 83  <161 mg/dL   HDL 33 (*) >09 mg/dL   Total CHOL/HDL Ratio 4.5     VLDL 17  0 - 40 mg/dL   LDL Cholesterol 98  0 - 99 mg/dL   Comment:            Total Cholesterol/HDL:CHD Risk     Coronary Heart Disease Risk Table                         Men   Women      1/2 Average Risk   3.4   3.3      Average Risk       5.0   4.4      2 X Average Risk   9.6   7.1      3 X Average Risk  23.4   11.0                Use the calculated Patient Ratio     above and the CHD Risk Table     to  determine the patient's CHD Risk.                ATP III CLASSIFICATION (LDL):      <100     mg/dL   Optimal      161-096  mg/dL   Near or Above                        Optimal      130-159  mg/dL   Borderline      045-409  mg/dL   High      >811     mg/dL   Very High  CBC WITH DIFFERENTIAL      Status: Abnormal   Collection Time    09/08/13  6:30 AM      Result Value Range   WBC 15.8 (*) 4.0 - 10.5 K/uL   RBC 4.16  3.87 - 5.11 MIL/uL   Hemoglobin 11.1 (*) 12.0 - 15.0 g/dL   HCT 91.4 (*) 78.2 - 95.6 %   MCV 78.8  78.0 - 100.0 fL   MCH 26.7  26.0 - 34.0 pg   MCHC 33.8  30.0 - 36.0 g/dL   RDW 21.3  08.6 - 57.8 %   Platelets 237  150 - 400 K/uL   Neutrophils Relative % 69  43 - 77 %   Neutro Abs 11.0 (*) 1.7 - 7.7 K/uL   Lymphocytes Relative 22  12 - 46 %   Lymphs Abs 3.5  0.7 - 4.0 K/uL   Monocytes Relative 8  3 - 12 %   Monocytes Absolute 1.3 (*) 0.1 - 1.0 K/uL   Eosinophils Relative 0  0 - 5 %   Eosinophils Absolute 0.1  0.0 - 0.7 K/uL   Basophils Relative 0  0 - 1 %   Basophils Absolute 0.0  0.0 - 0.1 K/uL  COMPREHENSIVE METABOLIC PANEL     Status: Abnormal   Collection Time    09/08/13  6:30 AM      Result Value Range   Sodium 135  135 - 145 mEq/L   Potassium 3.6  3.5 - 5.1 mEq/L   Chloride 97  96 - 112 mEq/L   CO2 23  19 - 32 mEq/L   Glucose, Bld 188 (*) 70 - 99 mg/dL   BUN 21  6 - 23 mg/dL   Creatinine, Ser 4.69  0.50 - 1.10 mg/dL   Calcium 9.4  8.4 - 62.9 mg/dL   Total Protein 8.0  6.0 - 8.3 g/dL   Albumin 3.5  3.5 - 5.2 g/dL   AST 21  0 - 37 U/L   ALT 11  0 - 35 U/L   Alkaline Phosphatase 79  39 - 117 U/L   Total Bilirubin 0.8  0.3 - 1.2 mg/dL   GFR calc non Af Amer 71 (*) >90 mL/min   GFR calc Af Amer 82 (*) >90 mL/min   Comment: (NOTE)     The eGFR has been calculated using the CKD EPI equation.     This calculation has not been validated in all clinical situations.     eGFR's persistently <90 mL/min signify possible Chronic Kidney     Disease.  PROTIME-INR     Status: None   Collection Time    09/08/13  8:30 AM      Result Value Range   Prothrombin Time 14.8  11.6 - 15.2 seconds   INR 1.19  0.00 - 1.49  Dg Chest 2 View  09/08/2013   CLINICAL DATA:  Shortness of breath.  Fever.  Altered mental status.  EXAM: CHEST  2 VIEW  COMPARISON:   01/09/2013  FINDINGS: Mild cardiomegaly. Chronic interstitial accentuation in the lung bases, similar to prior. The lungs appear otherwise clear. No discrete pneumonia.  IMPRESSION: 1. Mild cardiomegaly. 2. Mild interstitial accentuation in the lung bases appears chronic.   Electronically Signed   By: Herbie Baltimore M.D.   On: 09/08/2013 00:51   Ct Head (brain) Wo Contrast  09/08/2013   CLINICAL DATA:  Altered mental status  EXAM: CT HEAD WITHOUT CONTRAST  TECHNIQUE: Contiguous axial images were obtained from the base of the skull through the vertex without intravenous contrast.  COMPARISON:  01/11/2013 brain MRI  FINDINGS: Skull and Sinuses:No significant abnormality.  Orbits: No acute abnormality.  Brain: No evidence of acute hemorrhage, infarct, mass lesion, or shift. Ventriculomegaly out of proportion to cerebral atrophy/sulcal enlargement, similar to previous imaging. There is deep cerebral white matter low-attenuation, also likely stable from previous brain MR imaging. Intracranial calcific atherosclerosis.  IMPRESSION: 1.  No acute intracranial findings. 2. 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   Electronically Signed   By: Tiburcio Pea M.D.   On: 09/08/2013 00:17    Assessment and plan per attending neurologist  Felicie Morn PA-C [facility] [phone]  09/08/2013, 9:50 AM   Assessment/Plan: 62 YO female with a 1 year progressive decline in memory, but since March of 2014 husband has noted a decline in her gait and increase in urinary incontinence. CT head shows chronic ventriculomegaly highly suggestive of NPH and MRI in [facility] also showed chronic vnetriculomegaly with transependymal edema at that time.   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  As this has been a progressive decline and patient is not acutely declining presently, would recommend large volume LP with PT evaluation of gait and memory before and after  LP.    While in hospital will obtain a TSH, B12, RPR, Folate, ESR, CRP, Thiamine to evaluate for other possible etiologies of cognition decline. Agree with PT/OT  Patient should follow up with Dr. Pearlean Brownie as a out patient.     Patient seen and examined together with physician assistant and I concur with the assessment and plan.  Wyatt Portela, MD

## 2013-09-08 NOTE — H&P (Signed)
[facility] History and Physical  Patient: Dawn [name]  ZOX:[id number]  DOB: 1951/04/07  DOS: the patient was seen and examined on 09/08/2013 PCP: Alva Garnet., MD  Chief Complaint: Altered mental status  HPI: Dawn [name] is a 62 y.o. female with Past medical history of diabetes mellitus, hypertension, stroke. The patient is coming from home. The patient was brought in by husband as he found her confused with incontinence of bowel and bladder. The patient denied any complaint of fever, chills, chest pain, shortness of breath, cough, nausea, vomiting, abdominal pain, diarrhea, focal neurological weakness. She did complain of burning urination that has been ongoing since yesterday. She denies any changes in her medication or use of recreational drugs. Husband today found that she was acting confused and was standing close to the door why she had an episode of incontinence of both bowel and bladder. Husband also mentioned that since last few months she has been having repeated episode of urinary incontinence. He also reports that she has been walking with a broad gait and is always off balance. He does not report any fall or trauma.  Review of Systems: as mentioned in the history of present illness.  A Comprehensive review of the other systems is negative.  Past Medical History  Diagnosis Date  . Diabetes mellitus without complication   . Hypertension   . Stroke 01/11/13    right corona radiata infarct   Past Surgical History  Procedure Laterality Date  . No past surgeries     Social History:  reports that she has never smoked. She has never used smokeless tobacco. She reports that she does not drink alcohol or use illicit drugs. Independent for most of her  ADL.  Allergies  Allergen Reactions  . Penicillins Swelling    No family history on file.  Prior to Admission medications   Medication Sig Start Date End Date Taking? Authorizing Provider  amLODipine (NORVASC)  10 MG tablet Take 10 mg by mouth daily.  08/23/13  Yes Historical Provider, MD  aspirin 81 MG chewable tablet Chew 1 tablet (81 mg total) by mouth daily. 01/12/13  Yes Vassie Loll, MD  BENICAR HCT 20-12.5 MG per tablet Take 1 tablet by mouth daily.  08/28/13  Yes Historical Provider, MD  gabapentin (NEURONTIN) 100 MG capsule Take 1 capsule (100 mg total) by mouth 3 (three) times daily. 01/12/13  Yes Vassie Loll, MD  insulin detemir (LEVEMIR) 100 UNIT/ML injection Inject 18 Units into the skin 2 (two) times daily.   Yes Historical Provider, MD  metFORMIN (GLUCOPHAGE) 1000 MG tablet Take 1,000 mg by mouth 2 (two) times daily with a meal.   Yes Historical Provider, MD  simvastatin (ZOCOR) 20 MG tablet Take 1 tablet (20 mg total) by mouth daily at 6 PM. 01/12/13  Yes Vassie Loll, MD    Physical Exam: Filed Vitals:   09/08/13 0115 09/08/13 0130 09/08/13 0145 09/08/13 0200  BP: 164/90 127/84 130/76 130/70  Pulse: 115 110 103 105  Temp:      TempSrc:      Resp: 26 20 17 17   Height:      Weight:      SpO2: 96% 98% 94% 92%    General: Alert, Awake and Oriented to Time, Place and Person. Appear in mild distress Eyes: PERRL ENT: Oral Mucosa clear moist. Neck: No JVD Cardiovascular: S1 and S2 Present, aortic systolic Murmur, Peripheral Pulses Present Respiratory: Bilateral Air entry equal and Decreased, Clear to Auscultation,  No Crackles, no  wheezes Abdomen: Bowel Sound Present, Soft and Non tender Skin: No Rash Extremities: No Pedal edema, no calf tenderness Neurologic: Grossly Unremarkable.Mental status oriented at present appropriate speech and attention, Cranial Nerves pupils are reactive, Motor strength bilaterally equal strength, Sensation bilaterally equal sensation to light touch, reflexes intact, babinski negative, Proprioception equivocal bilaterally, Cerebellar test finger-nose-finger with mild past pointing.  Labs on Admission:  CBC:  Recent Labs Lab 09/07/13 2140  WBC  16.7*  NEUTROABS 13.7*  HGB 12.0  HCT 35.9*  MCV 79.2  PLT 269    CMP     Component Value Date/Time   NA 135 09/07/2013 2140   K 3.7 09/07/2013 2140   CL 96 09/07/2013 2140   CO2 25 09/07/2013 2140   GLUCOSE 294* 09/07/2013 2140   BUN 26* 09/07/2013 2140   CREATININE 1.20* 09/07/2013 2140   CALCIUM 9.2 09/07/2013 2140   PROT 8.2 09/07/2013 2140   ALBUMIN 4.0 09/07/2013 2140   AST 15 09/07/2013 2140   ALT 11 09/07/2013 2140   ALKPHOS 72 09/07/2013 2140   BILITOT 0.7 09/07/2013 2140   GFRNONAA 48* 09/07/2013 2140   GFRAA 56* 09/07/2013 2140    No results found for this basename: LIPASE, AMYLASE,  in the last 168 hours No results found for this basename: AMMONIA,  in the last 168 hours  No results found for this basename: CKTOTAL, CKMB, CKMBINDEX, TROPONINI,  in the last 168 hours BNP (last 3 results) No results found for this basename: PROBNP,  in the last 8760 hours  Radiological Exams on Admission: Dg Chest 2 View  09/08/2013   CLINICAL DATA:  Shortness of breath.  Fever.  Altered mental status.  EXAM: CHEST  2 VIEW  COMPARISON:  01/09/2013  FINDINGS: Mild cardiomegaly. Chronic interstitial accentuation in the lung bases, similar to prior. The lungs appear otherwise clear. No discrete pneumonia.  IMPRESSION: 1. Mild cardiomegaly. 2. Mild interstitial accentuation in the lung bases appears chronic.   Electronically Signed   By: Herbie Baltimore M.D.   On: 09/08/2013 00:51   Ct Head (brain) Wo Contrast  09/08/2013   CLINICAL DATA:  Altered mental status  EXAM: CT HEAD WITHOUT CONTRAST  TECHNIQUE: Contiguous axial images were obtained from the base of the skull through the vertex without intravenous contrast.  COMPARISON:  01/11/2013 brain MRI  FINDINGS: Skull and Sinuses:No significant abnormality.  Orbits: No acute abnormality.  Brain: No evidence of acute hemorrhage, infarct, mass lesion, or shift. Ventriculomegaly out of proportion to cerebral atrophy/sulcal enlargement,  similar to previous imaging. There is deep cerebral white matter low-attenuation, also likely stable from previous brain MR imaging. Intracranial calcific atherosclerosis.  IMPRESSION: 1.  No acute intracranial findings. 2. 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   Electronically Signed   By: Tiburcio Pea M.D.   On: 09/08/2013 00:17    EKG: Independently reviewed. normal EKG, normal sinus rhythm.  Assessment/Plan Principal Problem:   Altered mental status Active Problems:   Tachycardia   Diabetes   Hypertension   UTI (urinary tract infection)   Hydrocephalus   Incontinence   1. Altered mental status The patient is presenting with confusion and incontinence. Her urine is suggestive of possible UTI. She is tachycardic. Her lab work does not show any significant abnormality. She has a CT scan of the head which is suggestive of hydrocephalus which appears unchanged and has been present since 2007. Although her symptoms of ataxia, urinary incontinence, and mild memory impairment could point to presence of  normal pressure hydrocephalus. I will consult neurology for further input. I would also continue her on Cipro and IV fluids Follow cultures have been up and PTOT evaluation in the morning as well and follow serial neuro checks. I would also obtain an EEG  2. Diabetes Continue home insulin and placing her on sliding scale  3. Hypertension Continue amlodipine  Consults: Neurology  DVT Prophylaxis: subcutaneous Heparin Nutrition: As tolerated cardiac and diabetic  Code Status: Full  Family Communication: Husband was present at bedside, opportunity was given to ask question and all questions were answered satisfactorily at the time of interview. Disposition: Admitted to inpatient in telemetry unit.  Author: Lynden Oxford, MD [facility] Pager: [phone] 09/08/2013, 4:20 AM    If 7PM-7AM, please contact  night-coverage [email or web address] Password [email or web address]

## 2013-09-09 LAB — CBC
HCT: 34.8 % — ABNORMAL LOW (ref 36.0–46.0)
Hemoglobin: 11.6 g/dL — ABNORMAL LOW (ref 12.0–15.0)
MCH: 26.7 pg (ref 26.0–34.0)
MCHC: 33.3 g/dL (ref 30.0–36.0)
RBC: 4.35 MIL/uL (ref 3.87–5.11)

## 2013-09-09 LAB — GLUCOSE, CAPILLARY
Glucose-Capillary: 104 mg/dL — ABNORMAL HIGH (ref 70–99)
Glucose-Capillary: 174 mg/dL — ABNORMAL HIGH (ref 70–99)

## 2013-09-09 MED ORDER — CIPROFLOXACIN IN D5W 400 MG/200ML IV SOLN
400.0000 mg | Freq: Two times a day (BID) | INTRAVENOUS | Status: DC
  Filled 2013-09-09 (×2): qty 200

## 2013-09-09 MED ORDER — CIPROFLOXACIN HCL 500 MG PO TABS: 500.0000 mg | ORAL_TABLET | Freq: Two times a day (BID) | ORAL | Status: DC

## 2013-09-09 NOTE — Procedures (Signed)
EEG report.  Brief clinical history: 62 years old female with progressive gait impairment and cognitive decline. No prior history of frank epileptic seizures.  Technique: this is a 17 channel routine scalp EEG performed at the bedside with bipolar and monopolar montages arranged in accordance to the international 10/20 system of electrode placement. One channel was dedicated to EKG recording.  The study was performed predominantly during sleep. Intermittent photic stimulation was utilized as activating procedure.  Description: patient spent very limited time in the wakeful state, the best background consisting of a medium amplitude, posterior dominant, poorly sustained, symmetric and reactive 9 Hz rhythm. Drowsiness demonstrated dropout of the alpha rhythm. Stage 2 sleep showed symmetric and synchronous sleep spindles without intermixed epileptiform discharges. Intermittent photic stimulation did not induce a driving response.  No focal or generalized epileptiform discharges noted.  No slowing seen.  EKG showed sinus rhythm.  Impression: this is a normal awake and asleep EEG. Please, be aware that a normal EEG does not exclude the possibility of epilepsy.  Clinical correlation is advised.  Wyatt Portela, MD

## 2013-09-09 NOTE — Care Management Note (Signed)
    Page 1 of 2   09/10/2013     8:27:08 AM   CARE MANAGEMENT NOTE 09/10/2013  Patient:  Dawn [name], Dawn [name]   Account Number:  [id number]  Date Initiated:  09/09/2013  Documentation initiated by:  [facility]  Subjective/Objective Assessment:   adm: confused with incontinence of bowel and bladder     Action/Plan:   discharge planning   Anticipated DC Date:  09/09/2013   Anticipated DC Plan:  HOME W HOME HEALTH SERVICES      DC Planning Services  CM consult      [facility] Choice  HOME HEALTH   Choice offered to / List presented to:  C-1 Patient   DME arranged  Levan Hurst      DME agency  [facility]     HH arranged  HH-2 PT      [facility] agency  [facility]   Status of service:  Completed, signed off Medicare Important Message given?   (If response is "NO", the following Medicare IM given date fields will be blank) Date Medicare IM given:   Date Additional Medicare IM given:    Discharge Disposition:  HOME W HOME HEALTH SERVICES  Per UR Regulation:    If discussed at Long Length of Stay Meetings, dates discussed:    Comments:  09/10/13 08:00 CM received callback from [facility], and she accepted referral with start of care within 48 hours of discharge.  Pt has been a client of [facility] in the past. Clinicals faxed to [facility] for HHPT.  No other CM needs were communicated.  Freddy Jaksch, BSN, [place] [phone].  09/09/13 16:40 CM spoke with pt for choice.  Pt chooses [facility] for HHPT.  Rolling walker to be delivered to room prior to discharge. Pt understands [facility] is not available on weekends and states if [facility] is not possible to call her at home and she will choose another option tomorrow (09/10/13).  Referral called to [facility] and spoke with answering service who states after hours rep will give CM a call back.  .  Pt understands Start of Care will not be until Monday when [facility] is opened.  CM did not receive call back from after hours REP  to set up referral and is receiving no answer at this time. CM will follow tomorrow and if not able to set up with [facility], will offer pt another choice.  Freddy Jaksch, BSN, CM [phone].

## 2013-09-09 NOTE — Progress Notes (Signed)
NEURO HOSPITALIST PROGRESS NOTE   SUBJECTIVE:                                                                                                                        In good spirits this morning. Episode of transient change in mental status last night. Offers no neurological complains. Had high volume CSF LP yesterday with pre and post PT evaluation that demonstrated significant gait improvement and mild improvement in MMSE.  OBJECTIVE:                                                                                                                           Vital signs in last 24 hours: Temp:  [97.5 F (36.4 C)-101.2 F (38.4 C)] 98.6 F (37 C) (11/22 0400) Pulse Rate:  [91-116] 105 (11/22 0400) Resp:  [18-20] 20 (11/22 0400) BP: (123-141)/(75-90) 141/88 mmHg (11/22 0400) SpO2:  [96 %-100 %] 100 % (11/22 0400)  Intake/Output from previous day: 11/21 0701 - 11/22 0700 In: 240 [P.O.:240] Out: 1 [Urine:1] Intake/Output this shift:   Nutritional status: Carb Control  Past Medical History  Diagnosis Date  . Diabetes mellitus without complication   . Hypertension   . Stroke 01/11/13    right corona radiata infarct    Neurologic Exam:  Mental Status:  Alert, oriented to hospital, thought content appropriate. Speech fluent without evidence of aphasia. Able to follow 3 step commands without difficulty.  Cranial Nerves:  II: Discs flat bilaterally; Visual fields grossly normal, pupils equal, round, reactive to light and accommodation  III,IV, VI: ptosis not present, extra-ocular motions intact bilaterally  V,VII: smile symmetric, facial light touch sensation normal bilaterally  VIII: hearing normal bilaterally  IX,X: gag reflex present  XI: bilateral shoulder shrug  XII: midline tongue extension without atrophy or fasciculations  Motor:  Right : Upper extremity 5/5 Left: Upper extremity 5/5  Lower extremity 5/5 Lower extremity 5/5  Tone and  bulk:normal tone throughout; no atrophy noted  Sensory: Pinprick and light touch intact throughout, bilaterally  Deep Tendon Reflexes:  Right: Upper Extremity Left: Upper extremity  biceps (C-5 to C-6) 2/4 biceps (C-5 to C-6) 2/4  tricep (C7) 2/4 triceps (C7) 2/4  Brachioradialis (C6) 2/4 Brachioradialis (C6) 2/4  Lower  Extremity Lower Extremity  quadriceps (L-2 to L-4) 2/4 quadriceps (L-2 to L-4) 2/4  Achilles (S1) 0/4 Achilles (S1) 0/4  Plantars:  Right: downgoing Left: downgoing  Cerebellar:  normal finger-to-nose, normal heel-to-shin test  Gait: wide based, shuffling  CV: pulses palpable throughout    Lab Results: Lab Results  Component Value Date/Time   CHOL 148 09/08/2013  6:00 AM   Lipid Panel  Recent Labs  09/08/13 0600  CHOL 148  TRIG 83  HDL 33*  CHOLHDL 4.5  VLDL 17  LDLCALC 98    Studies/Results: Dg Chest 2 View  09/08/2013   CLINICAL DATA:  Shortness of breath.  Fever.  Altered mental status.  EXAM: CHEST  2 VIEW  COMPARISON:  01/09/2013  FINDINGS: Mild cardiomegaly. Chronic interstitial accentuation in the lung bases, similar to prior. The lungs appear otherwise clear. No discrete pneumonia.  IMPRESSION: 1. Mild cardiomegaly. 2. Mild interstitial accentuation in the lung bases appears chronic.   Electronically Signed   By: Herbie Baltimore M.D.   On: 09/08/2013 00:51   Ct Head (brain) Wo Contrast  09/08/2013   CLINICAL DATA:  Altered mental status  EXAM: CT HEAD WITHOUT CONTRAST  TECHNIQUE: Contiguous axial images were obtained from the base of the skull through the vertex without intravenous contrast.  COMPARISON:  01/11/2013 brain MRI  FINDINGS: Skull and Sinuses:No significant abnormality.  Orbits: No acute abnormality.  Brain: No evidence of acute hemorrhage, infarct, mass lesion, or shift. Ventriculomegaly out of proportion to cerebral atrophy/sulcal enlargement, similar to previous imaging. There is deep cerebral white matter low-attenuation, also likely  stable from previous brain MR imaging. Intracranial calcific atherosclerosis.  IMPRESSION: 1.  No acute intracranial findings. 2. 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   Electronically Signed   By: Tiburcio Pea M.D.   On: 09/08/2013 00:17   Dg Fluoro Guide Ndl Plc/bx  09/08/2013   CLINICAL DATA:  Normal pressure hydrocephalus.  EXAM: Therapeutic LUMBAR PUNCTURE UNDER FLUOROSCOPIC GUIDANCE  FLUOROSCOPY TIME:  48 seconds.  PROCEDURE: Informed consent was obtained from the patient prior to the procedure, including potential complications of headache, allergy, and pain. With the patient prone, the lower back was prepped with Betadine. 1% Lidocaine was used for local anesthesia. Lumbar puncture was performed at the L2-3 level using a gauge needle with return of clear CSF with an opening pressure of 16 cm water. Thirtyml of CSF was withdrawn for therapeutic purposes. Closing pressure of 5 cm water was measured. The patient tolerated the procedure well and there were no apparent complications.  IMPRESSION: Using sterile technique, lumbar puncture was performed for therapeutic purposes for potential normal pressure hydrocephalus.   Electronically Signed   By: Roque Lias M.D.   On: 09/08/2013 16:37    MEDICATIONS                                                                                                                       I have reviewed the patient's current medications.  ASSESSMENT/PLAN:                                                                                                           62 y/o with clinical and neuro-imaging picture highly suggestive of NPH. Had significant gait improvement and relatively mild MMSE improvement after high volume CSF LP, and thus could be a good candidate for shunt placement. Needs outpatient follow up with neurology/neurosurgery for further approach and treatment probable NPH.  Please, call neurology with  questions or concerns.  Wyatt Portela, MD [facility] [phone]  09/09/2013, 7:55 AM

## 2013-09-09 NOTE — Discharge Summary (Signed)
Physician Discharge Summary  Breeonna Mone RUE:[id number] DOB: [birth year] DOA: 09/07/2013  PCP: Alva Garnet., MD  Admit date: 09/07/2013 Discharge date: 09/09/2013  Time spent: 35 minutes  Recommendations for Outpatient Follow-up:  1. Follow up with PCP in 1 week  2. Follow up with Neurology  Recommendations for primary care physician for things to follow:  Follow up TSH   Discharge Diagnoses:  Principal Problem:   Altered mental status Active Problems:   Tachycardia   Diabetes   Hypertension   UTI (urinary tract infection)   Hydrocephalus   Incontinence  Discharge Condition: Stable  Diet recommendation: Heart healthy  Filed Weights   09/07/13 2112 09/08/13 0451  Weight: 70.444 kg (155 lb 4.8 oz) 71.85 kg (158 lb 6.4 oz)   History of present illness:  Dawn [name] is a 62 y.o. female with Past medical history of diabetes mellitus, hypertension, stroke. The patient is coming from home. The patient was brought in by husband as he found her confused with incontinence of bowel and bladder. The patient denied any complaint of fever, chills, chest pain, shortness of breath, cough, nausea, vomiting, abdominal pain, diarrhea, focal neurological weakness.  She did complain of burning urination that has been ongoing since yesterday. She denies any changes in her medication or use of recreational drugs. Husband today found that she was acting confused and was standing close to the door why she had an episode of incontinence of both bowel and bladder. Husband also mentioned that since last few months she has been having repeated episode of urinary incontinence. He also reports that she has been walking with a broad gait and is always off balance. He does not report any fall or trauma.  Hospital Course:  Acute encephalopathy - The patient is presenting with confusion and incontinence. Her urine is suggestive of possible UTI. She is tachycardic. 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 Neurology has been consulted and recommended a large-volume LP with physical therapy evaluation pre-and post procedure. This was achieved with the help of interventional radiology, and signigicant improvement in patient's gait and mental status has been noted after the large-volume LP. She also underwent an EEG which was within normal limits. She may be a good candidate for shunt placement and she is to followup with neurology as an outpatient for further treatment. Given initial urinalysis on presentation, she was empirically started on antibiotics and discharged to complete an oral course of ciprofloxacin. Patient mildly tachycardic, TSH done in March of 2014 showed a TSH of 0.28, repeat TSH was obtained during his hospitalization is pending at the time of discharge. She was advised to followup with her PCP early next week and followup on her TSH levels. Diabetes - Continue home insulin and placing her on sliding scale  Hypertension - Continue amlodipine  Procedures:  LP   Consultations:  Neurology  Discharge Exam: Filed Vitals:   09/08/13 1949 09/08/13 2326 09/09/13 0400 09/09/13 1010  BP: 130/78 136/90 141/88 130/79  Pulse: 91 116 105   Temp: 97.9 F (36.6 C) 101.2 F (38.4 C) 98.6 F (37 C)   TempSrc: Axillary Oral Axillary   Resp: 20 20 20    Height:      Weight:      SpO2: 98% 100% 100%    General: NAD Cardiovascular: RRR Respiratory: CTA biL  Discharge Instructions    Medication List         amLODipine 10 MG tablet  Commonly known as:  NORVASC  Take 10 mg by  mouth daily.     aspirin 81 MG chewable tablet  Chew 1 tablet (81 mg total) by mouth daily.     BENICAR HCT 20-12.5 MG per tablet  Generic drug:  olmesartan-hydrochlorothiazide  Take 1 tablet by mouth daily.     ciprofloxacin 500 MG tablet  Commonly known as:  CIPRO  Take 1 tablet (500 mg total) by mouth 2 (two) times daily.     gabapentin 100 MG capsule  Commonly known as:   NEURONTIN  Take 1 capsule (100 mg total) by mouth 3 (three) times daily.     insulin detemir 100 UNIT/ML injection  Commonly known as:  LEVEMIR  Inject 18 Units into the skin 2 (two) times daily.     metFORMIN 1000 MG tablet  Commonly known as:  GLUCOPHAGE  Take 1,000 mg by mouth 2 (two) times daily with a meal.     simvastatin 20 MG tablet  Commonly known as:  ZOCOR  Take 1 tablet (20 mg total) by mouth daily at 6 PM.           Follow-up Information   Follow up with Alva Garnet., MD. Schedule an appointment as soon as possible for a visit in 1 week.   Specialty:  Internal Medicine   Contact information:   [address] [place] [address] [phone]       Follow up with Gates Rigg, MD. Schedule an appointment as soon as possible for a visit in 2 weeks.   Specialties:  Neurology, Radiology   Contact information:   [address] [place] [address] [phone]      The results of significant diagnostics from this hospitalization (including imaging, microbiology, ancillary and laboratory) are listed below for reference.    Significant Diagnostic Studies: Dg Chest 2 View  09/08/2013   CLINICAL DATA:  Shortness of breath.  Fever.  Altered mental status.  EXAM: CHEST  2 VIEW  COMPARISON:  01/09/2013  FINDINGS: Mild cardiomegaly. Chronic interstitial accentuation in the lung bases, similar to prior. The lungs appear otherwise clear. No discrete pneumonia.  IMPRESSION: 1. Mild cardiomegaly. 2. Mild interstitial accentuation in the lung bases appears chronic.   Electronically Signed   By: Herbie Baltimore M.D.   On: 09/08/2013 00:51   Ct Head (brain) Wo Contrast  09/08/2013   CLINICAL DATA:  Altered mental status  EXAM: CT HEAD WITHOUT CONTRAST  TECHNIQUE: Contiguous axial images were obtained from the base of the skull through the vertex without intravenous contrast.  COMPARISON:  01/11/2013 brain MRI  FINDINGS: Skull and  Sinuses:No significant abnormality.  Orbits: No acute abnormality.  Brain: No evidence of acute hemorrhage, infarct, mass lesion, or shift. Ventriculomegaly out of proportion to cerebral atrophy/sulcal enlargement, similar to previous imaging. There is deep cerebral white matter low-attenuation, also likely stable from previous brain MR imaging. Intracranial calcific atherosclerosis.  IMPRESSION: 1.  No acute intracranial findings. 2. 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   Electronically Signed   By: Tiburcio Pea M.D.   On: 09/08/2013 00:17   Dg Fluoro Guide Ndl Plc/bx  09/08/2013   CLINICAL DATA:  Normal pressure hydrocephalus.  EXAM: Therapeutic LUMBAR PUNCTURE UNDER FLUOROSCOPIC GUIDANCE  FLUOROSCOPY TIME:  48 seconds.  PROCEDURE: Informed consent was obtained from the patient prior to the procedure, including potential complications of headache, allergy, and pain. With the patient prone, the lower back was prepped with Betadine. 1% Lidocaine was used for local anesthesia. Lumbar puncture was performed  at the L2-3 level using a gauge needle with return of clear CSF with an opening pressure of 16 cm water. Thirtyml of CSF was withdrawn for therapeutic purposes. Closing pressure of 5 cm water was measured. The patient tolerated the procedure well and there were no apparent complications.  IMPRESSION: Using sterile technique, lumbar puncture was performed for therapeutic purposes for potential normal pressure hydrocephalus.   Electronically Signed   By: Roque Lias M.D.   On: 09/08/2013 16:37   Labs: Basic Metabolic Panel:  Recent Labs Lab 09/07/13 2140 09/08/13 0630  NA 135 135  K 3.7 3.6  CL 96 97  CO2 25 23  GLUCOSE 294* 188*  BUN 26* 21  CREATININE 1.20* 0.87  CALCIUM 9.2 9.4   Liver Function Tests:  Recent Labs Lab 09/07/13 2140 09/08/13 0630  AST 15 21  ALT 11 11  ALKPHOS 72 79  BILITOT 0.7 0.8  PROT 8.2 8.0  ALBUMIN  4.0 3.5   CBC:  Recent Labs Lab 09/07/13 2140 09/08/13 0630 09/09/13 0640  WBC 16.7* 15.8* 13.7*  NEUTROABS 13.7* 11.0*  --   HGB 12.0 11.1* 11.6*  HCT 35.9* 32.8* 34.8*  MCV 79.2 78.8 80.0  PLT 269 237 232   CBG:  Recent Labs Lab 09/08/13 1155 09/08/13 1659 09/08/13 2009 09/09/13 0747 09/09/13 1139  GLUCAP 172* 127* 262* 104* 174*   Signed:  Theodis Kinsel  [facility] 09/09/2013, 6:03 PM

## 2013-09-10 NOTE — Progress Notes (Signed)
Late Entry   CSW spoke with pt about SNF.  Pt reports she has HH already in place and 24 assistance at home.

## 2013-09-12 ENCOUNTER — Telehealth: Payer: Self-pay | Admitting: Neurology

## 2013-09-14 LAB — VITAMIN B1: Vitamin B1 (Thiamine): <7 nmol/L — ABNORMAL LOW (ref 8–30)

## 2013-09-18 ENCOUNTER — Ambulatory Visit: Payer: 59 | Admitting: Nurse Practitioner

## 2013-09-18 ENCOUNTER — Telehealth: Payer: Self-pay | Admitting: Nurse Practitioner

## 2013-09-19 NOTE — Progress Notes (Signed)
Patient no showed for appointment

## 2013-09-20 ENCOUNTER — Telehealth: Payer: Self-pay | Admitting: Nurse Practitioner

## 2013-09-20 NOTE — Telephone Encounter (Signed)
NEEDS APPT ASAP

## 2013-09-20 NOTE — Telephone Encounter (Signed)
PT HAD APPT ON 12-1 BUT COULD NOT MAKE IT--NEEDS ANOTHER APPT ASAP

## 2013-09-26 ENCOUNTER — Ambulatory Visit (INDEPENDENT_AMBULATORY_CARE_PROVIDER_SITE_OTHER): Payer: 59 | Admitting: Nurse Practitioner

## 2013-09-26 ENCOUNTER — Encounter: Payer: Self-pay | Admitting: Nurse Practitioner

## 2013-09-26 VITALS — BP 174/97 | HR 116 | Temp 98.5°F | Ht 62.25 in | Wt 160.0 lb

## 2013-09-26 DIAGNOSIS — I6350 Cerebral infarction due to unspecified occlusion or stenosis of unspecified cerebral artery: Secondary | ICD-10-CM

## 2013-09-26 DIAGNOSIS — I639 Cerebral infarction, unspecified: Secondary | ICD-10-CM

## 2013-09-26 DIAGNOSIS — G912 (Idiopathic) normal pressure hydrocephalus: Secondary | ICD-10-CM

## 2013-09-26 DIAGNOSIS — R413 Other amnesia: Secondary | ICD-10-CM

## 2013-09-26 NOTE — Patient Instructions (Signed)
Continue your current medications.  We will like to see you in 6-8 weeks, you will see Dr. Pearlean Brownie next time.  If you start getting worse before then , please call our office.  Worse would be increased confusion, more difficulty walking.

## 2013-09-26 NOTE — Progress Notes (Signed)
PATIENT: Dawn [name] DOB: [birth year]   REASON FOR VISIT: recent hospitalization for AMS, follow up HISTORY FROM: patient  HISTORY OF PRESENT ILLNESS: UPDATE 09/26/13 (LL): Patient returns for follow up.  Since last visit, she had increasing gait difficulty and worsening cognitive function.  She was taken to [facility] on 09/08/13 with AMS.  CT head shows chronic ventriculomegaly highly suggestive of NPH and MRI in March also showed chronic ventriculomegaly with transependymal edema at that time.  She was diagnosed with NPH and lumbar puncture and therapeutic drainage (approximately 30 mls) was completed. Had high volume CSF LP with pre and post PT evaluation that demonstrated significant gait improvement and mild improvement in MMSE.  Opening pressure 16 cm H20, closing pressure 5 cm H2O.  EEG awake and asleep EEG was negative.  She was found to have a UTI during this admission with elevated white count and was treated empirically with antibiotics.  She was sent home with a 10-day treatment of Cipro which she has completed.  Her husband states that she is now back to her baseline. MMSE today is 19/30, with deficits in Serial 7's, Delayed recall 0/3, and copying pentagrams.  Clock drawing is 2/4, AFT 4.  UPDATE 05/11/13 (LL): Patient returns to office for followup for stroke that occurred on 01/09/2013. Patient states she is doing fairly well completed home physical therapy and occupational therapy and taking her medications every day. She states that she walks very slow now which is much different than before. 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 She states that there is no way she can do that anymore so she is filed for disability. She is taking aspirin 81 mg daily for secondary stroke prophylaxis. Patient denies medication side effects, with no signs of bleeding. States she is changing the way she is eating, baking food instead of trying and eating a lot of fish. She  states she is home alone during the day and her husband works 2 jobs   PRIOR HPI: Dawn [name] is an 62 y.o. female with a past medical history significant for hypertension, DM, admitted to [facility] on 3/24 due to confusion, decreased responsiveness, no feeling well. She recalls that she was kind of confused and her husband became concerned because she was less responsive than usual and was incontinent. However, she denies headache, vertigo, double vision, difficulty swallowing or speaking, slurred speech, language or vision impairment. The initial work up in the hospital was not particularly impressive but MRI-DWI completed revealed a small subacute appearing white matter infarcts in the right corona radiata. 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 Posterior circulation is okay. Dawn [name] reports gait difficulty that she described as " slow walking" over the past 2-3 months. In addition, relates mild forgetfulness. No bladder incontinence.   REVIEW OF SYSTEMS: Full 14 system review of systems performed and notable only for:  neurological: Memory loss, weakness, dizziness    ALLERGIES: Allergies  Allergen Reactions  . Penicillins Swelling    HOME MEDICATIONS: Outpatient Prescriptions Prior to Visit  Medication Sig Dispense Refill  . amLODipine (NORVASC) 10 MG tablet Take 10 mg by mouth daily.       Marland Kitchen aspirin 81 MG chewable tablet Chew 1 tablet (81 mg total) by mouth daily.      Marland Kitchen BENICAR HCT 20-12.5 MG per tablet Take 1 tablet  by mouth daily.       . ciprofloxacin (CIPRO) 500 MG tablet Take 1 tablet (500 mg total) by mouth 2 (two) times daily.  14 tablet  0  . gabapentin (NEURONTIN) 100 MG capsule Take 1 capsule (100 mg total) by mouth 3 (three) times daily.  90 capsule  1  . insulin detemir (LEVEMIR) 100 UNIT/ML injection Inject 18 Units into the skin 2 (two) times daily.        . metFORMIN (GLUCOPHAGE) 1000 MG tablet Take 1,000 mg by mouth 2 (two) times daily with a meal.      . simvastatin (ZOCOR) 20 MG tablet Take 1 tablet (20 mg total) by mouth daily at 6 PM.  30 tablet  1   No facility-administered medications prior to visit.    PAST MEDICAL HISTORY: Past Medical History  Diagnosis Date  . Diabetes mellitus without complication   . Hypertension   . Stroke 01/11/13    right corona radiata infarct    PAST SURGICAL HISTORY: Past Surgical History  Procedure Laterality Date  . No past surgeries      FAMILY HISTORY: Family History  Problem Relation Age of Onset  . Hypertension Mother   . Hypertension Father     SOCIAL HISTORY: History   Social History  . Marital Status: Married    Spouse Name: Jonny Ruiz    Number of Children: 3  . Years of Education: 12   Occupational History  . disability    Social History Main Topics  . Smoking status: Never Smoker   . Smokeless tobacco: Never Used  . Alcohol Use: No  . Drug Use: No  . Sexual Activity: Yes   Other Topics Concern  . Not on file   Social History Narrative  . No narrative on file     PHYSICAL EXAM  Filed Vitals:   09/26/13 1013  BP: 174/97  Pulse: 116  Temp: 98.5 F (36.9 C)  TempSrc: Oral  Height: 5' 2.25" (1.581 m)  Weight: 160 lb (72.576 kg)   Body mass index is 29.04 kg/(m^2).  Generalized: Well developed, in no acute distress  Head: normocephalic and atraumatic. Oropharynx benign  Neck: Supple, no carotid bruits  Cardiac: Regular rate rhythm, no murmur  Musculoskeletal: No deformity   Neurological examination  Mentation: Alert oriented to time, place. Follows all commands speech and language fluent  MMSE today is 19/30, with deficits in Serial 7's, Delayed recall 0/3, and copying pentagrams.  Clock drawing is 2/4, AFT 4.  Cranial nerve II-XII: Pupils were equal round reactive to light extraocular movements were full, visual field were full on confrontational test.  facial sensation and strength were normal. hearing was intact to finger rubbing bilaterally. Uvula tongue midline. head turning and shoulder shrug and were normal and symmetric.Tongue protrusion into cheek strength was normal. MILD LEFT LOWER FACIAL ASYMMETRY.  MOTOR: normal bulk and tone, full strength in the BUE, BLE, fine finger movements DECREASED ON LEFT, no pronator drift  Sensory: normal and symmetric to light touch, pinprick, and  vibration  Coordination: finger-nose-finger, heel-to-shin bilaterally, no dysmetria Reflexes:  Deep tendon reflexes in the upper and lower extremities are present and symmetric.  Gait and Station: Rising up from seated position without assistance, SLOW, STIFF STRIDE, UNABLE to perform tiptoe, heel walking, or tandem without difficulty. Romberg negative.  DIAGNOSTIC DATA (LABS, IMAGING, TESTING) - I reviewed patient records, labs, notes, testing and imaging myself where available.  Lab Results  Component Value Date   WBC  13.7* 09/09/2013   HGB 11.6* 09/09/2013   HCT 34.8* 09/09/2013   MCV 80.0 09/09/2013   PLT 232 09/09/2013      Component Value Date/Time   NA 135 09/08/2013 0630   K 3.6 09/08/2013 0630   CL 97 09/08/2013 0630   CO2 23 09/08/2013 0630   GLUCOSE 188* 09/08/2013 0630   BUN 21 09/08/2013 0630   CREATININE 0.87 09/08/2013 0630   CALCIUM 9.4 09/08/2013 0630   PROT 8.0 09/08/2013 0630   ALBUMIN 3.5 09/08/2013 0630   AST 21 09/08/2013 0630   ALT 11 09/08/2013 0630   ALKPHOS 79 09/08/2013 0630   BILITOT 0.8 09/08/2013 0630   GFRNONAA 71* 09/08/2013 0630   GFRAA 82* 09/08/2013 0630   Lab Results  Component Value Date   CHOL 148 09/08/2013   HDL 33* 09/08/2013   LDLCALC 98 09/08/2013   TRIG 83 09/08/2013   CHOLHDL 4.5 09/08/2013   Lab Results  Component Value Date   VITAMINB12 399 09/08/2013   Lab Results  Component Value Date   TSH 0.282* 01/10/2013   Lab Results  Component Value Date   ESRSEDRATE 51* 09/08/2013   MRI  of the brain 01/11/2013 Small subacute appearing white matter infarcts in the right corona radiata out a. No mass effect or hemorrhage. 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 Chronic ventriculomegaly, with suspected hyperdynamic flow in the cerebral aqueduct and a low level of transependymal edema. This might reflect chronic or congenital communicating hydrocephalus, and in the appropriate clinical setting normal pressure hydrocephalus could not be excluded.  MRA of the brain 01/11/2013 1. 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 There is moderate right ICA stenosis in the anterior genu. Chronic irregularity and 50 - 55% stenosis of the right ICA at the skull base. 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 The appearance is stable since 2010. Posterior circulation and other anterior circulation vessels are stable and within normal limits.  MRI Lumbar Spine 01/11/2013 Severe compression of the thecal sac at L3-4 due to a combination of a 4 mm spondylolisthesis and broad-based disc bulge as well as a protrusion into the right neural foramen compressing the right L3 nerve. Compression of the left lateral recess and the left L5 nerve by a central disc protrusion and extrusion at L4-5.   ASSESSMENT AND PLAN Dawn [name] is a 62 y.o. female with small right corona radiata infarcts on 01/11/13. 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 Vascular risk factors include Hypertension, Diabetes, hyperlipidemia.  Recently hospitalized for AMS, gait difficulty; also UTI with elevated WBC.  Chronic hydrocephalus, relatively unchanged since 2010.  Therapeutic LP was completed with improvement of gait and cognition, but unsure if this or antibiotics for the UTI were the explanation.  She also has lumbar disc disease which may be contributing to gait abnormality.  Have asked the patient to return in 6-8 weeks for follow up; if her condition worsens  again, may consider referal to Neurosurgery for shunt placement.  Continue aspirin 81 mg orally every day for secondary stroke prevention and maintain strict control of hypertension with blood pressure goal below 130/90, diabetes with hemoglobin A1c goal below 7% and lipids with LDL cholesterol goal below 70 mg/dL.   Return in about 7 weeks (around 11/14/2013)  to see Dr. Pearlean Brownie; sooner as needed.  Ronal Fear, MSN, NP-C 09/26/2013, 12:36 PM [facility] [address] [place], [place] [address] [phone]  Plan of care was discussed with Dr. Joycelyn Schmid during the time of this visit.  Note: This document was prepared with digital dictation and possible smart phrase technology. Any transcriptional errors that result from this process are unintentional.

## 2013-11-23 ENCOUNTER — Ambulatory Visit: Payer: 59 | Admitting: Neurology

## 2014-02-02 ENCOUNTER — Encounter (HOSPITAL_COMMUNITY)
Admission: RE | Admit: 2014-02-02 | Discharge: 2014-02-02 | Disposition: A | Discharge status: Home / Self Care | Payer: 59 | Source: Ambulatory Visit | Attending: Obstetrics and Gynecology | Admitting: Obstetrics and Gynecology

## 2014-02-02 ENCOUNTER — Encounter (HOSPITAL_COMMUNITY): Payer: Self-pay

## 2014-02-02 DIAGNOSIS — Z01810 Encounter for preprocedural cardiovascular examination: Secondary | ICD-10-CM | POA: Insufficient documentation

## 2014-02-02 DIAGNOSIS — Z01812 Encounter for preprocedural laboratory examination: Secondary | ICD-10-CM | POA: Insufficient documentation

## 2014-02-02 HISTORY — DX: (Idiopathic) normal pressure hydrocephalus: G91.2

## 2014-02-02 HISTORY — DX: Altered mental status, unspecified: R41.82

## 2014-02-02 HISTORY — DX: Shortness of breath: R06.02

## 2014-02-02 HISTORY — DX: Hyperlipidemia, unspecified: E78.5

## 2014-02-02 HISTORY — DX: Gastro-esophageal reflux disease without esophagitis: K21.9

## 2014-02-02 LAB — BASIC METABOLIC PANEL
BUN: 18 mg/dL (ref 6–23)
CHLORIDE: 94 meq/L — AB (ref 96–112)
CO2: 30 mEq/L (ref 19–32)
Calcium: 9.7 mg/dL (ref 8.4–10.5)
Creatinine, Ser: 0.87 mg/dL (ref 0.50–1.10)
GFR calc Af Amer: 82 mL/min — ABNORMAL LOW (ref >90)
GFR, EST NON AFRICAN AMERICAN: 71 mL/min — AB (ref >90)
Glucose, Bld: 134 mg/dL — ABNORMAL HIGH (ref 70–99)
Potassium: 3.7 mEq/L (ref 3.7–5.3)
SODIUM: 138 meq/L (ref 137–147)

## 2014-02-02 LAB — CBC
HEMATOCRIT: 37.4 % (ref 36.0–46.0)
HEMOGLOBIN: 12.6 g/dL (ref 12.0–15.0)
MCH: 26.6 pg (ref 26.0–34.0)
MCHC: 33.7 g/dL (ref 30.0–36.0)
MCV: 78.9 fL (ref 78.0–100.0)
Platelets: 250 10*3/uL (ref 150–400)
RBC: 4.74 MIL/uL (ref 3.87–5.11)
RDW: 13.1 % (ref 11.5–15.5)
WBC: 8.4 10*3/uL (ref 4.0–10.5)

## 2014-02-02 NOTE — Anesthesia Preprocedure Evaluation (Addendum)
Anesthesia Evaluation    Airway       Dental   Pulmonary former smoker,          Cardiovascular hypertension, On Medications     Neuro/Psych Normal pressure hydrocephalus - treated with LP in 11/14.  Followed by [facility] Neurology. CVA    GI/Hepatic GERD-  Controlled,  Endo/Other  diabetes, Insulin Dependent  Renal/GU      Musculoskeletal   Abdominal   Peds  Hematology   Anesthesia Other Findings   Reproductive/Obstetrics                          Anesthesia Physical Anesthesia Plan  ASA: III  Anesthesia Plan: General LMA   Post-op Pain Management:    Induction:   Airway Management Planned:   Additional Equipment:   Intra-op Plan:   Post-operative Plan:   Informed Consent:   Plan Discussed with:   Anesthesia Plan Comments:         Anesthesia Quick Evaluation

## 2014-02-02 NOTE — Patient Instructions (Addendum)
   Your procedure is scheduled on:  Wednesday, April 22  Enter through the [employer] [facility] at:  [place] up the phone at the desk and dial [phone] and inform us of your arrival.  Please call this number if you have any problems the morning of surgery: [phone]  Remember: Do not eat food after midnight: Tuesday Do not drink clear liquids after: 9 AM Wednesday, day of surgery  Take these medicines the morning of surgery with a SIP OF WATER:  Benicar, amlodipine, gabapentin.  Patient instructed to withhold Metformin Tuesday night dose and Wednesday, day of surgery dose.  Patient to half levemir insulin dose on Tuesday night - take 11 units instead of 22 units.    Do not wear jewelry, make-up, or FINGER nail polish No metal in your hair or on your body. Do not wear lotions, powders, perfumes.  You may wear deodorant.  Do not bring valuables to the hospital. Contacts, dentures or bridgework may not be worn into surgery.  Patients discharged on the day of surgery will not be allowed to drive home.  Home with Husband John cell [phone].

## 2014-02-05 ENCOUNTER — Other Ambulatory Visit (HOSPITAL_COMMUNITY): Payer: Self-pay | Admitting: Obstetrics and Gynecology

## 2014-02-05 ENCOUNTER — Other Ambulatory Visit: Payer: Self-pay | Admitting: Obstetrics and Gynecology

## 2014-02-05 NOTE — H&P (Signed)
Dawn [name] is a 63 y.o.  female P 3-0-0-3 presents for examination under anesthesia and biopsy of the cervix  because of post-menopausal bleeding and a friable ulcerated cervical lesion.  the patient reports that for the past several months she will experience random vaginal spotting requiring the need for a panty liner.  She has no associated pain with these episodes nor changes in her bowel or bladder function or vaginitis symptoms. She underwent a pelvic ultrasound in early April 2015 that showed a uterus: 5.24 x 5.03 x 3.83 cm, endometrium: 1.89 mm, left ovary: 1.54 x 1.74 x 1.23 cm and right ovary: 2.75 x 2.00 x 1.98 cm;  distance from fundus to external os was 7.6 cm. Due to the thin endometrial lining an endometrial biopsy was not performed,  it was noted on exam, however, that the patient had a very friable ulcerated area on the right surface of her cervix.  The patient's March 2015 PAP smear was normal without any evidence of HPV.  Due to the patient's discomfort with the pelvic exam and the need to further evaluate the cervical lesion it was decided to have that evaluation performed under anesthesia as it will include a biopsy of the ulcerated area on the cervix.  Due to the disruptive nature of her symptoms and the findings on exam, the patient agreed to proceed with further evaluation and possible management o her symptoms under anesthesia.   Past Medical History  OB History: G 3;  P: 3-0-0-3,  SVB   GYN History: menarche: 63 YO;    LMP: Post Menopausal;  Denies history of STD or abnormal PAP;   Last PAP smear: March 2015-normal  Medical History: Diabetes Mellitus, Hypertension, Normal Pressure Hydrocephalus, GERD and Hyperlipidemia  Surgical History:  Tubal Sterlization Denies problems with anesthesia or history of blood transfusions  Family History: Hypertension, Diabetes Mellitus, Heart Disease  Social History: Married and Disabled; Denies alcohol or tobacco use   Outpatient  Encounter Prescriptions as of 02/05/2014  Medication Sig  . amLODipine (NORVASC) 10 MG tablet Take 10 mg by mouth daily.   Marland Kitchen aspirin 81 MG chewable tablet Chew 1 tablet (81 mg total) by mouth daily.  Marland Kitchen BENICAR HCT 20-12.5 MG per tablet Take 1 tablet by mouth daily.   Marland Kitchen gabapentin (NEURONTIN) 100 MG capsule Take 1 capsule (100 mg total) by mouth 3 (three) times daily.  . insulin detemir (LEVEMIR) 100 UNIT/ML injection Inject 22-25 Units into the skin 2 (two) times daily. Patient takes 25 units of Levemir in the morning and 22 in the evening  . metFORMIN (GLUCOPHAGE) 1000 MG tablet Take 1,000 mg by mouth 2 (two) times daily with a meal.  . Multiple Vitamins-Minerals (MULTIVITAMIN WITH MINERALS) tablet Take 1 tablet by mouth daily.  Marland Kitchen OVER THE COUNTER MEDICATION Take 1 tablet by mouth as needed. Patient uses [employer] brand of acid reducer as needed  . simvastatin (ZOCOR) 20 MG tablet Take 1 tablet (20 mg total) by mouth daily at 6 PM.  HCTZ 25 mg daily  Allergies  Allergen Reactions  . Penicillins Swelling    ROS: Admits to glasses;   Denies headache, vision changes, nasal congestion, dysphagia, tinnitus, dizziness, hoarseness, cough,  chest pain, shortness of breath, nausea, vomiting, diarrhea,constipation,  urinary frequency, urgency  dysuria, hematuria, vaginitis symptoms, pelvic pain, swelling of joints,easy bruising,  myalgias, arthralgias, skin rashes, unexplained weight loss and except as is mentioned in the history of present illness, patient's review of systems is otherwise negative.  Denies sensitivity to peanuts, shellfish, soy, latex or adhesives.   Physical Exam  Bp: 150/80   P: 120 (repeat 100)  R: 15  Temperature: 98.4 degrees F orally  Weight: 158 lbs.  Height: 5'2"             BMI: 28.9  Neck: supple without masses or thyromegaly Lungs: clear to auscultation Heart: regular rate and rhythm Abdomen: soft, non-tender and no organomegaly Pelvic:EGBUS- atrophic,  vagina-atrophic, uterus-normal size, cervix- friable and tender; adnexae-no tenderness or masses Extremities:  no clubbing, cyanosis or edema   Assesment: Post Menopausal Bleeding           Cervical Lesion   Disposition:  Reviewed the risks of surgery to include, but not limited to: reaction to anesthesia, damage to adjacent organs, infection and excessive bleeding. The patient verbalized understanding of these risks and has consented to proceed with Examination of Post Menopausal Bleeding Under Anesthesia to include Cervical Biopsy and Possible Hysteroscopy with Dilatation and Curettage at [facility] on February 07, 2014.  CSN# [id number]   Earmon Sherrow J. Florene Glen, PA-C  for Dr. Harvie Bridge. Mancel Bale

## 2014-02-07 ENCOUNTER — Encounter (HOSPITAL_COMMUNITY): Payer: 59 | Admitting: Anesthesiology

## 2014-02-07 ENCOUNTER — Encounter (HOSPITAL_COMMUNITY)
Admission: RE | Disposition: A | Discharge status: Home / Self Care | Payer: Self-pay | Source: Ambulatory Visit | Attending: Obstetrics and Gynecology

## 2014-02-07 ENCOUNTER — Encounter (HOSPITAL_COMMUNITY): Payer: Self-pay | Admitting: Anesthesiology

## 2014-02-07 ENCOUNTER — Ambulatory Visit (HOSPITAL_COMMUNITY): Payer: 59 | Admitting: Anesthesiology

## 2014-02-07 ENCOUNTER — Ambulatory Visit (HOSPITAL_COMMUNITY)
Admission: RE | Admit: 2014-02-07 | Discharge: 2014-02-07 | Disposition: A | Discharge status: Home / Self Care | Payer: 59 | Source: Ambulatory Visit | Attending: Obstetrics and Gynecology | Admitting: Obstetrics and Gynecology

## 2014-02-07 DIAGNOSIS — G912 (Idiopathic) normal pressure hydrocephalus: Secondary | ICD-10-CM | POA: Insufficient documentation

## 2014-02-07 DIAGNOSIS — K219 Gastro-esophageal reflux disease without esophagitis: Secondary | ICD-10-CM | POA: Insufficient documentation

## 2014-02-07 DIAGNOSIS — Z87891 Personal history of nicotine dependence: Secondary | ICD-10-CM | POA: Insufficient documentation

## 2014-02-07 DIAGNOSIS — N950 Postmenopausal bleeding: Secondary | ICD-10-CM | POA: Insufficient documentation

## 2014-02-07 DIAGNOSIS — I10 Essential (primary) hypertension: Secondary | ICD-10-CM | POA: Insufficient documentation

## 2014-02-07 DIAGNOSIS — N888 Other specified noninflammatory disorders of cervix uteri: Secondary | ICD-10-CM | POA: Insufficient documentation

## 2014-02-07 DIAGNOSIS — E119 Type 2 diabetes mellitus without complications: Secondary | ICD-10-CM | POA: Insufficient documentation

## 2014-02-07 DIAGNOSIS — E785 Hyperlipidemia, unspecified: Secondary | ICD-10-CM | POA: Insufficient documentation

## 2014-02-07 DIAGNOSIS — Z794 Long term (current) use of insulin: Secondary | ICD-10-CM | POA: Insufficient documentation

## 2014-02-07 HISTORY — PX: DILATATION & CURRETTAGE/HYSTEROSCOPY WITH RESECTOCOPE: SHX5572

## 2014-02-07 LAB — GLUCOSE, CAPILLARY: Glucose-Capillary: 131 mg/dL — ABNORMAL HIGH (ref 70–99)

## 2014-02-07 SURGERY — DILATATION & CURETTAGE/HYSTEROSCOPY WITH RESECTOCOPE
Anesthesia: General | Site: Vagina

## 2014-02-07 MED ORDER — ONDANSETRON HCL 4 MG/2ML IJ SOLN
INTRAMUSCULAR | Status: DC | PRN
  Administered 2014-02-07: 4 mg via INTRAVENOUS

## 2014-02-07 MED ORDER — LIDOCAINE HCL (CARDIAC) 20 MG/ML IV SOLN
INTRAVENOUS | Status: AC
  Filled 2014-02-07: qty 5

## 2014-02-07 MED ORDER — FERRIC SUBSULFATE 259 MG/GM EX SOLN
CUTANEOUS | Status: AC
  Filled 2014-02-07: qty 8

## 2014-02-07 MED ORDER — DEXAMETHASONE SODIUM PHOSPHATE 10 MG/ML IJ SOLN
INTRAMUSCULAR | Status: AC
  Filled 2014-02-07: qty 1

## 2014-02-07 MED ORDER — LACTATED RINGERS IV SOLN
INTRAVENOUS | Status: DC
Start: 2014-02-07 — End: 2014-02-07
  Administered 2014-02-07 (×2): via INTRAVENOUS

## 2014-02-07 MED ORDER — PHENYLEPHRINE HCL 10 MG/ML IJ SOLN: 10.0000 mg | INTRAVENOUS | Status: DC | PRN

## 2014-02-07 MED ORDER — LIDOCAINE HCL (CARDIAC) 20 MG/ML IV SOLN
INTRAVENOUS | Status: DC | PRN
  Administered 2014-02-07: 50 mg via INTRAVENOUS

## 2014-02-07 MED ORDER — GLYCINE 1.5 % IR SOLN
Status: DC | PRN
  Administered 2014-02-07: 3000 mL

## 2014-02-07 MED ORDER — MIDAZOLAM HCL 2 MG/2ML IJ SOLN
INTRAMUSCULAR | Status: DC | PRN
  Administered 2014-02-07: 1 mg via INTRAVENOUS

## 2014-02-07 MED ORDER — LIDOCAINE HCL 1 % IJ SOLN
INTRAMUSCULAR | Status: AC
  Filled 2014-02-07: qty 20

## 2014-02-07 MED ORDER — MEPERIDINE HCL 25 MG/ML IJ SOLN
6.2500 mg | INTRAMUSCULAR | Status: DC | PRN
Start: 2014-02-07 — End: 2014-02-07

## 2014-02-07 MED ORDER — MIDAZOLAM HCL 2 MG/2ML IJ SOLN
INTRAMUSCULAR | Status: AC
  Filled 2014-02-07: qty 2

## 2014-02-07 MED ORDER — LIDOCAINE HCL 1 % IJ SOLN
INTRAMUSCULAR | Status: DC | PRN
  Administered 2014-02-07: 10 mL

## 2014-02-07 MED ORDER — FERROUS SULFATE 220 (44 FE) MG/5ML PO ELIX
ORAL_SOLUTION | ORAL | Status: DC | PRN
  Administered 2014-02-07: 220 mg

## 2014-02-07 MED ORDER — PROMETHAZINE HCL 25 MG/ML IJ SOLN: 6.2500 mg | INTRAMUSCULAR | Status: DC | PRN

## 2014-02-07 MED ORDER — MIDAZOLAM HCL 2 MG/2ML IJ SOLN: 0.5000 mg | Freq: Once | INTRAMUSCULAR | Status: DC | PRN

## 2014-02-07 MED ORDER — FENTANYL CITRATE 0.05 MG/ML IJ SOLN
INTRAMUSCULAR | Status: DC | PRN
  Administered 2014-02-07: 50 ug via INTRAVENOUS

## 2014-02-07 MED ORDER — KETOROLAC TROMETHAMINE 30 MG/ML IJ SOLN: 15.0000 mg | Freq: Once | INTRAMUSCULAR | Status: DC | PRN

## 2014-02-07 MED ORDER — PROPOFOL 10 MG/ML IV EMUL
INTRAVENOUS | Status: AC
  Filled 2014-02-07: qty 20

## 2014-02-07 MED ORDER — PHENYLEPHRINE HCL 10 MG/ML IJ SOLN
10.0000 mg | INTRAVENOUS | Status: DC | PRN
  Administered 2014-02-07: 60 ug/min via INTRAVENOUS

## 2014-02-07 MED ORDER — ONDANSETRON HCL 4 MG/2ML IJ SOLN
INTRAMUSCULAR | Status: AC
  Filled 2014-02-07: qty 2

## 2014-02-07 MED ORDER — KETOROLAC TROMETHAMINE 30 MG/ML IJ SOLN
INTRAMUSCULAR | Status: AC
  Filled 2014-02-07: qty 1

## 2014-02-07 MED ORDER — FENTANYL CITRATE 0.05 MG/ML IJ SOLN
INTRAMUSCULAR | Status: AC
  Filled 2014-02-07: qty 5

## 2014-02-07 MED ORDER — PROPOFOL 10 MG/ML IV BOLUS
INTRAVENOUS | Status: DC | PRN
  Administered 2014-02-07: 150 mg via INTRAVENOUS

## 2014-02-07 MED ORDER — FENTANYL CITRATE 0.05 MG/ML IJ SOLN: 25.0000 ug | INTRAMUSCULAR | Status: DC | PRN

## 2014-02-07 SURGICAL SUPPLY — 23 items
CANISTER SUCT 3000ML (MISCELLANEOUS) ×3 IMPLANT
CATH ROBINSON RED A/P 16FR (CATHETERS) ×3 IMPLANT
CLOTH BEACON ORANGE TIMEOUT ST (SAFETY) ×3 IMPLANT
CONTAINER PREFILL 10% NBF 60ML (FORM) ×6 IMPLANT
DRAPE HYSTEROSCOPY (DRAPE) ×3 IMPLANT
DRSG TELFA 3X8 NADH (GAUZE/BANDAGES/DRESSINGS) ×3 IMPLANT
ELECT REM PT RETURN 9FT ADLT (ELECTROSURGICAL) ×3
ELECTRODE REM PT RTRN 9FT ADLT (ELECTROSURGICAL) ×1 IMPLANT
GLOVE BIO SURGEON STRL SZ7.5 (GLOVE) ×5 IMPLANT
GLOVE BIOGEL PI IND STRL 7.5 (GLOVE) ×1 IMPLANT
GLOVE BIOGEL PI INDICATOR 7.5 (GLOVE) ×2
GOWN STRL REUS W/TWL LRG LVL3 (GOWN DISPOSABLE) ×8 IMPLANT
LOOP ANGLED CUTTING 22FR (CUTTING LOOP) IMPLANT
NDL SPNL 22GX3.5 QUINCKE BK (NEEDLE) ×1 IMPLANT
NEEDLE SPNL 22GX3.5 QUINCKE BK (NEEDLE) ×3 IMPLANT
PACK VAGINAL MINOR WOMEN LF (CUSTOM PROCEDURE TRAY) ×3 IMPLANT
PAD DRESSING TELFA 3X8 NADH (GAUZE/BANDAGES/DRESSINGS) ×1 IMPLANT
PAD OB MATERNITY 4.3X12.25 (PERSONAL CARE ITEMS) ×3 IMPLANT
SET TUBING HYSTEROSCOPY 2 NDL (TUBING) ×2 IMPLANT
SYR CONTROL 10ML LL (SYRINGE) ×3 IMPLANT
TOWEL OR 17X24 6PK STRL BLUE (TOWEL DISPOSABLE) ×6 IMPLANT
TUBE HYSTEROSCOPY W Y-CONNECT (TUBING) ×2 IMPLANT
WATER STERILE IRR 1000ML POUR (IV SOLUTION) ×3 IMPLANT

## 2014-02-07 NOTE — Interval H&P Note (Signed)
History and Physical Interval Note:  02/07/2014 1:00 PM  Dawn [name]  has presented today for surgery, with the diagnosis of Post Menopausal Bleeding;   The various methods of treatment have been discussed with the patient and family. After consideration of risks, benefits and other options for treatment, the patient has consented to  Procedure(s): EXAM UNDER ANESTHESIA WITH POSSIBLE BIOPSY, POSSIBLE DILATATION & CURETTAGE/HYSTEROSCOPY WITH RESECTOCOPE  (N/A) as a surgical intervention .  The patient's history has been reviewed, patient examined, no change in status, stable for surgery.  I have reviewed the patient's chart and labs.  Questions were answered to the patient's satisfaction.     Delice Lesch

## 2014-02-07 NOTE — Anesthesia Postprocedure Evaluation (Signed)
  Anesthesia Post Note  Patient: Dawn [name]  Procedure(s) Performed: Procedure(s) (LRB): EXAM UNDER ANESTHESIA WITH BIOPSY, DILATATION & CURETTAGE/HYSTEROSCOPY (N/A)  Anesthesia type: GA  Patient location: PACU  Post pain: Pain level controlled  Post assessment: Post-op Vital signs reviewed  Last Vitals:  Filed Vitals:   02/07/14 1515  BP: 129/80  Pulse: 95  Temp:   Resp: 22    Post vital signs: Reviewed  Level of consciousness: sedated  Complications: No apparent anesthesia complications

## 2014-02-07 NOTE — Transfer of Care (Signed)
Immediate Anesthesia Transfer of Care Note  Patient: Dawn [name]  Procedure(s) Performed: Procedure(s): EXAM UNDER ANESTHESIA WITH BIOPSY, DILATATION & CURETTAGE/HYSTEROSCOPY (N/A)  Patient Location: PACU  Anesthesia Type:General  Level of Consciousness: awake, alert , oriented and patient cooperative  Airway & Oxygen Therapy: Patient Spontanous Breathing and Patient connected to nasal cannula oxygen  Post-op Assessment: Report given to PACU RN and Post -op Vital signs reviewed and stable  Post vital signs: Reviewed and stable  Complications: No apparent anesthesia complications

## 2014-02-07 NOTE — H&P (View-Only) (Signed)
Dawn [name] is a 63 y.o.  female P 3-0-0-3 presents for examination under anesthesia and biopsy of the cervix  because of post-menopausal bleeding and a friable ulcerated cervical lesion.  the patient reports that for the past several months she will experience random vaginal spotting requiring the need for a panty liner.  She has no associated pain with these episodes nor changes in her bowel or bladder function or vaginitis symptoms. She underwent a pelvic ultrasound in early April 2015 that showed a uterus: 5.24 x 5.03 x 3.83 cm, endometrium: 1.89 mm, left ovary: 1.54 x 1.74 x 1.23 cm and right ovary: 2.75 x 2.00 x 1.98 cm;  distance from fundus to external os was 7.6 cm. Due to the thin endometrial lining an endometrial biopsy was not performed,  it was noted on exam, however, that the patient had a very friable ulcerated area on the right surface of her cervix.  The patient's March 2015 PAP smear was normal without any evidence of HPV.  Due to the patient's discomfort with the pelvic exam and the need to further evaluate the cervical lesion it was decided to have that evaluation performed under anesthesia as it will include a biopsy of the ulcerated area on the cervix.  Due to the disruptive nature of her symptoms and the findings on exam, the patient agreed to proceed with further evaluation and possible management o her symptoms under anesthesia.   Past Medical History  OB History: G 3;  P: 3-0-0-3,  SVB   GYN History: menarche: 63 YO;    LMP: Post Menopausal;  Denies history of STD or abnormal PAP;   Last PAP smear: March 2015-normal  Medical History: Diabetes Mellitus, Hypertension, Normal Pressure Hydrocephalus, GERD and Hyperlipidemia  Surgical History:  Tubal Sterlization Denies problems with anesthesia or history of blood transfusions  Family History: Hypertension, Diabetes Mellitus, Heart Disease  Social History: Married and Disabled; Denies alcohol or tobacco use   Outpatient  Encounter Prescriptions as of 02/05/2014  Medication Sig  . amLODipine (NORVASC) 10 MG tablet Take 10 mg by mouth daily.   Marland Kitchen aspirin 81 MG chewable tablet Chew 1 tablet (81 mg total) by mouth daily.  Marland Kitchen BENICAR HCT 20-12.5 MG per tablet Take 1 tablet by mouth daily.   Marland Kitchen gabapentin (NEURONTIN) 100 MG capsule Take 1 capsule (100 mg total) by mouth 3 (three) times daily.  . insulin detemir (LEVEMIR) 100 UNIT/ML injection Inject 22-25 Units into the skin 2 (two) times daily. Patient takes 25 units of Levemir in the morning and 22 in the evening  . metFORMIN (GLUCOPHAGE) 1000 MG tablet Take 1,000 mg by mouth 2 (two) times daily with a meal.  . Multiple Vitamins-Minerals (MULTIVITAMIN WITH MINERALS) tablet Take 1 tablet by mouth daily.  Marland Kitchen OVER THE COUNTER MEDICATION Take 1 tablet by mouth as needed. Patient uses [employer] brand of acid reducer as needed  . simvastatin (ZOCOR) 20 MG tablet Take 1 tablet (20 mg total) by mouth daily at 6 PM.  HCTZ 25 mg daily  Allergies  Allergen Reactions  . Penicillins Swelling    ROS: Admits to glasses;   Denies headache, vision changes, nasal congestion, dysphagia, tinnitus, dizziness, hoarseness, cough,  chest pain, shortness of breath, nausea, vomiting, diarrhea,constipation,  urinary frequency, urgency  dysuria, hematuria, vaginitis symptoms, pelvic pain, swelling of joints,easy bruising,  myalgias, arthralgias, skin rashes, unexplained weight loss and except as is mentioned in the history of present illness, patient's review of systems is otherwise negative.  Denies sensitivity to peanuts, shellfish, soy, latex or adhesives.   Physical Exam  Bp: 150/80   P: 120 (repeat 100)  R: 15  Temperature: 98.4 degrees F orally  Weight: 158 lbs.  Height: 5'2"             BMI: 28.9  Neck: supple without masses or thyromegaly Lungs: clear to auscultation Heart: regular rate and rhythm Abdomen: soft, non-tender and no organomegaly Pelvic:EGBUS- atrophic,  vagina-atrophic, uterus-normal size, cervix- friable and tender; adnexae-no tenderness or masses Extremities:  no clubbing, cyanosis or edema   Assesment: Post Menopausal Bleeding           Cervical Lesion   Disposition:  Reviewed the risks of surgery to include, but not limited to: reaction to anesthesia, damage to adjacent organs, infection and excessive bleeding. The patient verbalized understanding of these risks and has consented to proceed with Examination of Post Menopausal Bleeding Under Anesthesia to include Cervical Biopsy and Possible Hysteroscopy with Dilatation and Curettage at [facility] on February 07, 2014.  CSN# [id number]   Dornell Grasmick J. Florene Glen, PA-C  for Dr. Harvie Bridge. Mancel Bale

## 2014-02-07 NOTE — Discharge Instructions (Signed)

## 2014-02-07 NOTE — Op Note (Signed)
Preop Diagnosis: Post Menopausal Bleeding    Postop Diagnosis: Post Menopausal Bleeding    Procedure: EXAM UNDER ANESTHESIA DILATATION & CURETTAGE/HYSTEROSCOPY   Anesthesia: General   Anesthesiologist: Rudean Curt, MD   Attending: Delice Lesch, MD   Assistant: N/a  Findings: Atrophic appearing endometrium, ulcerated area on the rt lateral aspect of the cervix externally from about 8-1 O'Clock.  Pathology: 1.Endometrial Curettings 2.Biopsies of ulcerated area  Fluids: 1100 cc  UOP: 300 cc  EBL: Minimal  Complications: None  Procedure:The patient was taken to the operating room after the risks, benefits and alternatives were discussed with the patient. The patient verbalized understanding and consent signed and witnessed. The patient was placed under general anesthesia with an LMA per anesthesiologist and prepped and draped in the normal sterile fashion.  Time Out was performed per protocol.  A bivalve speculum was placed in the patient's vagina and the anterior lip of the cervix was grasped with a single tooth tenaculum. A paracervical block was administered using a total of 10 cc of 1% lidocaine. The uterus sounded to 10 cm. The cervix was dilated for passage of the hysteroscope.  The hysteroscope was introduced into the uterine cavity and findings as noted above. Sharp curettage was performed until a gritty texture was noted and curettings were sent to pathology. The hysteroscope was reintroduced and no obvious remaining intracavitary lesions were noted.    Attention was then turned to the ulcerated area where several biopsies were done.  Monsel's solution was placed and hemostasis was noted.  All instruments were removed.  Sponge lap and needle count was correct. The patient tolerated the procedure well and was returned to the recovery room in good condition.

## 2014-02-08 ENCOUNTER — Encounter (HOSPITAL_COMMUNITY): Payer: Self-pay | Admitting: Obstetrics and Gynecology

## 2014-02-08 LAB — GLUCOSE, CAPILLARY: Glucose-Capillary: 142 mg/dL — ABNORMAL HIGH (ref 70–99)

## 2014-03-26 ENCOUNTER — Telehealth: Payer: Self-pay | Admitting: Neurology

## 2014-03-26 NOTE — Telephone Encounter (Signed)
error 

## 2014-03-27 NOTE — Telephone Encounter (Signed)
I spoke to him.

## 2014-03-27 NOTE — Telephone Encounter (Signed)
done

## 2014-03-28 ENCOUNTER — Ambulatory Visit: Payer: 59 | Admitting: *Deleted

## 2014-08-09 NOTE — Telephone Encounter (Signed)
error 

## 2014-11-29 ENCOUNTER — Other Ambulatory Visit: Payer: Self-pay

## 2014-11-29 DIAGNOSIS — Z1231 Encounter for screening mammogram for malignant neoplasm of breast: Secondary | ICD-10-CM

## 2014-12-05 ENCOUNTER — Ambulatory Visit
Admission: RE | Admit: 2014-12-05 | Discharge: 2014-12-05 | Disposition: A | Discharge status: Home / Self Care | Payer: Commercial Indemnity | Source: Ambulatory Visit

## 2014-12-05 DIAGNOSIS — Z1231 Encounter for screening mammogram for malignant neoplasm of breast: Secondary | ICD-10-CM

## 2015-02-01 ENCOUNTER — Encounter: Attending: Physical Medicine & Rehabilitation | Primary: Internal Medicine

## 2015-03-19 ENCOUNTER — Inpatient Hospital Stay (HOSPITAL_COMMUNITY)
Admission: EM | Admit: 2015-03-19 | Discharge: 2015-03-21 | DRG: 377 | Disposition: A | Discharge status: Home / Self Care | Payer: Managed Care, Other (non HMO) | Attending: Internal Medicine | Admitting: Internal Medicine

## 2015-03-19 ENCOUNTER — Emergency Department (HOSPITAL_COMMUNITY): Payer: Managed Care, Other (non HMO)

## 2015-03-19 ENCOUNTER — Encounter (HOSPITAL_COMMUNITY): Payer: Self-pay | Admitting: General Practice

## 2015-03-19 ENCOUNTER — Encounter (HOSPITAL_COMMUNITY)
Admission: EM | Disposition: A | Discharge status: Home / Self Care | Payer: Self-pay | Source: Home / Self Care | Attending: Internal Medicine

## 2015-03-19 ENCOUNTER — Other Ambulatory Visit (HOSPITAL_COMMUNITY): Payer: Self-pay

## 2015-03-19 DIAGNOSIS — Z880 Allergy status to penicillin: Secondary | ICD-10-CM | POA: Diagnosis not present

## 2015-03-19 DIAGNOSIS — I10 Essential (primary) hypertension: Secondary | ICD-10-CM | POA: Diagnosis present

## 2015-03-19 DIAGNOSIS — K449 Diaphragmatic hernia without obstruction or gangrene: Secondary | ICD-10-CM | POA: Diagnosis present

## 2015-03-19 DIAGNOSIS — G9341 Metabolic encephalopathy: Secondary | ICD-10-CM | POA: Diagnosis present

## 2015-03-19 DIAGNOSIS — Z8673 Personal history of transient ischemic attack (TIA), and cerebral infarction without residual deficits: Secondary | ICD-10-CM

## 2015-03-19 DIAGNOSIS — K219 Gastro-esophageal reflux disease without esophagitis: Secondary | ICD-10-CM | POA: Diagnosis present

## 2015-03-19 DIAGNOSIS — K5900 Constipation, unspecified: Secondary | ICD-10-CM | POA: Diagnosis present

## 2015-03-19 DIAGNOSIS — E785 Hyperlipidemia, unspecified: Secondary | ICD-10-CM | POA: Diagnosis present

## 2015-03-19 DIAGNOSIS — E1165 Type 2 diabetes mellitus with hyperglycemia: Secondary | ICD-10-CM | POA: Diagnosis present

## 2015-03-19 DIAGNOSIS — G629 Polyneuropathy, unspecified: Secondary | ICD-10-CM | POA: Diagnosis present

## 2015-03-19 DIAGNOSIS — D62 Acute posthemorrhagic anemia: Secondary | ICD-10-CM | POA: Diagnosis present

## 2015-03-19 DIAGNOSIS — D649 Anemia, unspecified: Secondary | ICD-10-CM | POA: Diagnosis present

## 2015-03-19 DIAGNOSIS — E876 Hypokalemia: Secondary | ICD-10-CM | POA: Diagnosis present

## 2015-03-19 DIAGNOSIS — K269 Duodenal ulcer, unspecified as acute or chronic, without hemorrhage or perforation: Secondary | ICD-10-CM | POA: Diagnosis present

## 2015-03-19 DIAGNOSIS — G912 (Idiopathic) normal pressure hydrocephalus: Secondary | ICD-10-CM | POA: Diagnosis present

## 2015-03-19 DIAGNOSIS — Z794 Long term (current) use of insulin: Secondary | ICD-10-CM

## 2015-03-19 DIAGNOSIS — K264 Chronic or unspecified duodenal ulcer with hemorrhage: Secondary | ICD-10-CM | POA: Diagnosis not present

## 2015-03-19 DIAGNOSIS — K921 Melena: Secondary | ICD-10-CM | POA: Diagnosis present

## 2015-03-19 DIAGNOSIS — N179 Acute kidney failure, unspecified: Secondary | ICD-10-CM | POA: Diagnosis not present

## 2015-03-19 DIAGNOSIS — K922 Gastrointestinal hemorrhage, unspecified: Secondary | ICD-10-CM | POA: Diagnosis present

## 2015-03-19 DIAGNOSIS — Z87891 Personal history of nicotine dependence: Secondary | ICD-10-CM | POA: Diagnosis not present

## 2015-03-19 DIAGNOSIS — I959 Hypotension, unspecified: Secondary | ICD-10-CM | POA: Diagnosis present

## 2015-03-19 DIAGNOSIS — IMO0002 Reserved for concepts with insufficient information to code with codable children: Secondary | ICD-10-CM

## 2015-03-19 HISTORY — PX: ESOPHAGOGASTRODUODENOSCOPY: SHX5428

## 2015-03-19 LAB — PREPARE RBC (CROSSMATCH)

## 2015-03-19 LAB — URINALYSIS, ROUTINE W REFLEX MICROSCOPIC
Bilirubin Urine: NEGATIVE
GLUCOSE, UA: NEGATIVE mg/dL
Hgb urine dipstick: NEGATIVE
KETONES UR: NEGATIVE mg/dL
NITRITE: NEGATIVE
PH: 5 (ref 5.0–8.0)
Protein, ur: NEGATIVE mg/dL
SPECIFIC GRAVITY, URINE: 1.019 (ref 1.005–1.030)
Urobilinogen, UA: 0.2 mg/dL (ref 0.0–1.0)

## 2015-03-19 LAB — URINE MICROSCOPIC-ADD ON

## 2015-03-19 LAB — CBC
HEMATOCRIT: 18.9 % — AB (ref 36.0–46.0)
Hemoglobin: 6.2 g/dL — CL (ref 12.0–15.0)
MCH: 26.5 pg (ref 26.0–34.0)
MCHC: 32.8 g/dL (ref 30.0–36.0)
MCV: 80.8 fL (ref 78.0–100.0)
Platelets: 234 10*3/uL (ref 150–400)
RBC: 2.34 MIL/uL — ABNORMAL LOW (ref 3.87–5.11)
RDW: 13.4 % (ref 11.5–15.5)
WBC: 13.4 10*3/uL — AB (ref 4.0–10.5)

## 2015-03-19 LAB — POC OCCULT BLOOD, ED: FECAL OCCULT BLD: POSITIVE — AB

## 2015-03-19 LAB — COMPREHENSIVE METABOLIC PANEL
ALBUMIN: 3.1 g/dL — AB (ref 3.5–5.0)
ALT: 12 U/L — AB (ref 14–54)
AST: 20 U/L (ref 15–41)
Alkaline Phosphatase: 24 U/L — ABNORMAL LOW (ref 38–126)
Anion gap: 16 — ABNORMAL HIGH (ref 5–15)
BILIRUBIN TOTAL: 0.5 mg/dL (ref 0.3–1.2)
BUN: 101 mg/dL — ABNORMAL HIGH (ref 6–20)
CALCIUM: 8.9 mg/dL (ref 8.9–10.3)
CO2: 29 mmol/L (ref 22–32)
Chloride: 94 mmol/L — ABNORMAL LOW (ref 101–111)
Creatinine, Ser: 1.72 mg/dL — ABNORMAL HIGH (ref 0.44–1.00)
GFR calc Af Amer: 36 mL/min — ABNORMAL LOW (ref >60)
GFR calc non Af Amer: 31 mL/min — ABNORMAL LOW (ref >60)
Glucose, Bld: 287 mg/dL — ABNORMAL HIGH (ref 65–99)
Potassium: 3.3 mmol/L — ABNORMAL LOW (ref 3.5–5.1)
Sodium: 139 mmol/L (ref 135–145)
TOTAL PROTEIN: 5.5 g/dL — AB (ref 6.5–8.1)

## 2015-03-19 LAB — MRSA PCR SCREENING: MRSA BY PCR: NEGATIVE

## 2015-03-19 LAB — GLUCOSE, CAPILLARY
GLUCOSE-CAPILLARY: 186 mg/dL — AB (ref 65–99)
GLUCOSE-CAPILLARY: 159 mg/dL — AB (ref 65–99)

## 2015-03-19 LAB — ABO/RH: ABO/RH(D): B NEG

## 2015-03-19 LAB — CBG MONITORING, ED: Glucose-Capillary: 183 mg/dL — ABNORMAL HIGH (ref 65–99)

## 2015-03-19 SURGERY — EGD (ESOPHAGOGASTRODUODENOSCOPY)
Anesthesia: Moderate Sedation

## 2015-03-19 MED ORDER — SODIUM CHLORIDE 0.9 % IV SOLN
INTRAVENOUS | Status: DC
  Administered 2015-03-19 – 2015-03-20 (×2): via INTRAVENOUS

## 2015-03-19 MED ORDER — MIDAZOLAM HCL 10 MG/2ML IJ SOLN
INTRAMUSCULAR | Status: DC | PRN
  Administered 2015-03-19: 2 mg via INTRAVENOUS
  Administered 2015-03-19: 5 mg via INTRAVENOUS
  Administered 2015-03-19: 2 mg via INTRAVENOUS

## 2015-03-19 MED ORDER — SIMVASTATIN 20 MG PO TABS
20.0000 mg | ORAL_TABLET | Freq: Every day | ORAL | Status: DC
  Administered 2015-03-20 (×2): 20 mg via ORAL
  Filled 2015-03-19 (×4): qty 1

## 2015-03-19 MED ORDER — SODIUM CHLORIDE 0.9 % IV SOLN
80.0000 mg | Freq: Once | INTRAVENOUS | Status: AC
  Administered 2015-03-19: 80 mg via INTRAVENOUS
  Filled 2015-03-19: qty 80

## 2015-03-19 MED ORDER — SODIUM CHLORIDE 0.9 % IV SOLN: INTRAVENOUS | Status: DC

## 2015-03-19 MED ORDER — SODIUM CHLORIDE 0.9 % IV SOLN
Freq: Once | INTRAVENOUS | Status: AC
  Administered 2015-03-19: 10 mL/h via INTRAVENOUS

## 2015-03-19 MED ORDER — SODIUM CHLORIDE 0.9 % IV SOLN
8.0000 mg/h | INTRAVENOUS | Status: DC
  Administered 2015-03-19 – 2015-03-20 (×2): 8 mg/h via INTRAVENOUS
  Filled 2015-03-19 (×6): qty 80

## 2015-03-19 MED ORDER — SODIUM CHLORIDE 0.9 % IV BOLUS (SEPSIS)
1000.0000 mL | Freq: Once | INTRAVENOUS | Status: AC
  Administered 2015-03-19: 1000 mL via INTRAVENOUS

## 2015-03-19 MED ORDER — INSULIN ASPART 100 UNIT/ML ~~LOC~~ SOLN
0.0000 [IU] | SUBCUTANEOUS | Status: DC
  Administered 2015-03-19 (×3): 2 [IU] via SUBCUTANEOUS
  Administered 2015-03-20: 1 [IU] via SUBCUTANEOUS
  Filled 2015-03-19: qty 1

## 2015-03-19 MED ORDER — SODIUM CHLORIDE 0.9 % IJ SOLN
PREFILLED_SYRINGE | INTRAMUSCULAR | Status: DC | PRN
  Administered 2015-03-19: 4 mL

## 2015-03-19 MED ORDER — INSULIN DETEMIR 100 UNIT/ML ~~LOC~~ SOLN
15.0000 [IU] | Freq: Two times a day (BID) | SUBCUTANEOUS | Status: DC
  Administered 2015-03-20 – 2015-03-21 (×2): 15 [IU] via SUBCUTANEOUS
  Filled 2015-03-19 (×5): qty 0.15

## 2015-03-19 MED ORDER — SODIUM CHLORIDE 0.9 % IJ SOLN
3.0000 mL | Freq: Two times a day (BID) | INTRAMUSCULAR | Status: DC
  Administered 2015-03-20: 10 mL via INTRAVENOUS
  Administered 2015-03-20 – 2015-03-21 (×2): 3 mL via INTRAVENOUS

## 2015-03-19 MED ORDER — FENTANYL CITRATE (PF) 100 MCG/2ML IJ SOLN
INTRAMUSCULAR | Status: DC | PRN
  Administered 2015-03-19 (×2): 25 ug via INTRAVENOUS

## 2015-03-19 MED ORDER — EPINEPHRINE HCL 0.1 MG/ML IJ SOSY
PREFILLED_SYRINGE | INTRAMUSCULAR | Status: AC
  Filled 2015-03-19: qty 10

## 2015-03-19 MED ORDER — BUTAMBEN-TETRACAINE-BENZOCAINE 2-2-14 % EX AERO
INHALATION_SPRAY | CUTANEOUS | Status: DC | PRN
  Administered 2015-03-19: 2 via TOPICAL

## 2015-03-19 MED ORDER — PANTOPRAZOLE SODIUM 40 MG IV SOLR
40.0000 mg | Freq: Two times a day (BID) | INTRAVENOUS | Status: DC
  Filled 2015-03-19: qty 40

## 2015-03-19 MED ORDER — ONDANSETRON HCL 4 MG/2ML IJ SOLN: 4.0000 mg | Freq: Four times a day (QID) | INTRAMUSCULAR | Status: DC | PRN

## 2015-03-19 MED ORDER — FENTANYL CITRATE (PF) 100 MCG/2ML IJ SOLN
INTRAMUSCULAR | Status: AC
  Filled 2015-03-19: qty 2

## 2015-03-19 MED ORDER — MIDAZOLAM HCL 5 MG/ML IJ SOLN
INTRAMUSCULAR | Status: AC
  Filled 2015-03-19: qty 2

## 2015-03-19 MED ORDER — MORPHINE SULFATE 2 MG/ML IJ SOLN: 1.0000 mg | INTRAMUSCULAR | Status: DC | PRN

## 2015-03-19 MED ORDER — DIPHENHYDRAMINE HCL 50 MG/ML IJ SOLN
INTRAMUSCULAR | Status: AC
  Filled 2015-03-19: qty 1

## 2015-03-19 MED ORDER — ONDANSETRON HCL 4 MG PO TABS: 4.0000 mg | ORAL_TABLET | Freq: Four times a day (QID) | ORAL | Status: DC | PRN

## 2015-03-19 NOTE — ED Notes (Signed)
Attempted report x's 2.  

## 2015-03-19 NOTE — Op Note (Signed)
[facility] [place], [id number]   ENDOSCOPY PROCEDURE REPORT  PATIENT: Dawn [name], Dawn [name]  MR#: [id number] BIRTHDATE: [birth year] , 79  yrs. old GENDER: female ENDOSCOPIST: Lafayette Dragon, MD REFERRED BY:  Hospitalist service PROCEDURE DATE:  03/19/2015 PROCEDURE:  EGD w/ control of bleeding and EGD w/ directed submucosal injection(s), any substance ASA CLASS:     Class III INDICATIONS:  melena, nausea, epigastric abdominal pain, and anemia, Hgb6.0 gm,. MEDICATIONS: Fentanyl 50 mcg IV and Versed 5 mg IV TOPICAL ANESTHETIC: Cetacaine Spray  DESCRIPTION OF PROCEDURE: After the risks benefits and alternatives of the procedure were thoroughly explained, informed consent was obtained.  The PENTAX GASTOROSCOPE [id number] endoscope was introduced through the mouth and advanced to the second portion of the duodenum , Without limitations.  The instrument was slowly withdrawn as the mucosa was fully examined.      ESOPHAGUS: The mucosa of the esophagus appeared normal.  STOMACH: A 2 cm hiatal hernia was noted.  DUODENUM: A single non-bleeding, round and deep ulcer, in the duodenal bulb, measuring 20 x 71mm in size, with a red spot, a visible vessel and surrounding edema was found in the duodenal bulb.  To prevent future bleeding one hemoclips was  placed on the site(s). and  4 cc of 1: 10.000u of Epinephrine injected around the ulcer.  Retroflexed views revealed no abnormalities.     The scope was then withdrawn from the patient and the procedure completed.  COMPLICATIONS: There were no immediate complications.  ENDOSCOPIC IMPRESSION: 1.   The mucosa of the esophagus appeared normal 2.   2 cm hiatal hernia 3.   Single non-bleeding ulcer duodenal bulb, measuring 20 x 32mm in size, was found in the duodenal bulb; To prevent future bleeding one hemoclips was placed on the site(s)  RECOMMENDATIONS: continue to observe for further bleeding Continue  PPI IV sips of clear liquids hold ASA and NSAID's will need oral Iron supplements check for H.Pylori antibody.  REPEAT EXAM: no  eSigned:  Lafayette Dragon, MD 03/19/2015 6:15 PM    CC:  PATIENT NAME:  Dawn [name], Dawn [name] MR#: [id number]

## 2015-03-19 NOTE — ED Notes (Signed)
Attempted report Xs 1.  

## 2015-03-19 NOTE — H&P (Signed)
[facility] History and Physical                                                                                    Dawn [name], is a 64 y.o. female  MRN: [id number]   DOB - 1950/12/09  Admit Date - 03/19/2015  Outpatient Primary MD for the patient is [facility]  Referring MD: Winfred Leeds / ER  Consulting MD: Olevia Perches / GASTROENTEROLOGY  With History of -  Past Medical History  Diagnosis Date  . Diabetes mellitus without complication   . Hypertension   . Stroke 01/11/13    right corona radiata infarct  . Altered mental status 09/08/13    taken to [facility] ER  . 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 SVD (spontaneous vaginal delivery)     x 3  . Hyperlipidemia   . Shortness of breath     Hx in past 25 yrs ago  . GERD (gastroesophageal reflux disease)       Past Surgical History  Procedure Laterality Date  . Tubal ligation    . Dilatation & currettage/hysteroscopy with resectocope N/A 02/07/2014    Procedure: EXAM UNDER ANESTHESIA WITH BIOPSY, DILATATION & CURETTAGE/HYSTEROSCOPY;  Surgeon: Delice Lesch, MD;  Location: [facility];  Service: Gynecology;  Laterality: N/A;    in for   Chief Complaint  Patient presents with  . GI Bleeding     HPI This is a 64 yo female w/ prior h/o CVA and CT head with findings c/w NPH; follows w/[facility] and has documented gait instability and cognitive impairment at baseline. Husband is primary caretaker and administers her meds. She also has DM, HTN, dyslipidemia and GERD. Husband reports pt with 1 week h/o no BM (typically goes QOD). Has received Miralax 2 x inpast week and finally this am had bloody BMs. Pt has been more lethargic past few days with poor oral intake. She also had been experiencing upper abdominal pain for 5 days. She had non bilious emesis several times in the past 5 days. Because of the bloody BMs EMS was called. She was found  to be hypotensive with BP 70/42- she was given 500 cc NS bolus in the field with improvement in BP to 96/68. CBG in the field was 359. Husband states he gave her 28 units of insulin this am-says thinks is still Levemir but not sure. Upon arrival to ER BP still soft 93/56, HR 94, RR 16 and afebrile. Hg low at 6.2 with baseline 12.6, WBC 13,400, MCV 80., BUN 101 Scr 1.72 with baseline 0.87, K 3.3, platelets 234,000. Because of AMS the EDP obtained CT Head which demonstrated no acute changes and after rehydration and initiation of PRBCs with improved BP her mentation has improved. She currently is requesting a bedpan to void. In ER stools are now more melanotic.   Review of Systems   In addition to the HPI above,  No Fever-chills, myalgias or other constitutional symptoms No Headache, changes with Vision or hearing, new weakness, tingling, numbness in any extremity, No problems swallowing food or Liquids, indigestion/reflux No Chest pain, Cough  or Shortness of Breath, palpitations, orthopnea or DOE No dysuria, hematuria or flank pain No new skin rashes, lesions, masses or bruises, No new joints pains-aches No recent weight gain or loss No polyuria, polydypsia or polyphagia,  *A full 10 point Review of Systems was done, except as stated above, all other Review of Systems were negative.  Social History History  Substance Use Topics  . Smoking status: Former Smoker -- 1.00 packs/day for 20 years    Types: Cigarettes    Quit date: 10/20/1983  . Smokeless tobacco: Never Used  . Alcohol Use: No    Resides at: Private residence  Lives with: Husband  Ambulatory status: w/o assistive devices but reports that most walk very slowly because of gait difficulties   Family History Family History  Problem Relation Age of Onset  . Hypertension Mother   . Hypertension CAD Father Mother      Prior to Admission medications   Medication Sig Start Date End Date Taking? Authorizing Provider    amLODipine (NORVASC) 10 MG tablet Take 10 mg by mouth daily.  08/23/13  Yes Historical Provider, MD  aspirin 81 MG chewable tablet Chew 1 tablet (81 mg total) by mouth daily. 01/12/13  Yes Barton Dubois, MD  BENICAR HCT 20-12.5 MG per tablet Take 1 tablet by mouth daily.  08/28/13  Yes Historical Provider, MD  gabapentin (NEURONTIN) 100 MG capsule Take 1 capsule (100 mg total) by mouth 3 (three) times daily. 01/12/13  Yes Barton Dubois, MD  hydrALAZINE (APRESOLINE) 25 MG tablet Take 25 mg by mouth 2 (two) times daily. 01/24/15  Yes Historical Provider, MD  insulin detemir (LEVEMIR) 100 UNIT/ML injection Inject 20-28 Units into the skin 2 (two) times daily. Patient takes 28 units of Levemir in the morning and 20 in the evening   Yes Historical Provider, MD  metFORMIN (GLUCOPHAGE) 1000 MG tablet Take 1,000 mg by mouth 2 (two) times daily with a meal.   Yes Historical Provider, MD  Multiple Vitamins-Minerals (MULTIVITAMIN WITH MINERALS) tablet Take 1 tablet by mouth daily.   Yes Historical Provider, MD  OVER THE COUNTER MEDICATION Take 1 tablet by mouth as needed. Patient uses [employer] brand of acid reducer as needed   Yes Historical Provider, MD  simvastatin (ZOCOR) 20 MG tablet Take 1 tablet (20 mg total) by mouth daily at 6 PM. 01/12/13  Yes Barton Dubois, MD    Allergies  Allergen Reactions  . Penicillins Swelling    Physical Exam  Vitals  Blood pressure 126/70, pulse 104, temperature 98.7 F (37.1 C), temperature source Oral, resp. rate 19, SpO2 100 %.   General:  In no acute distress, appears pale  Psych:  Normal affect, Denies Suicidal or Homicidal ideations, Awake Alert, Oriented X 3. Speech and thought patterns are clear and appropriate,  Neuro:   No focal neurological deficits, CN II through XII intact, Strength 5/5 all 4 extremities, Sensation intact all 4 extremities.  ENT:  Ears and Eyes appear Normal, Conjunctivae clear, PER. Moist oral mucosa without erythema or  exudates.  Neck:  Supple, No lymphadenopathy appreciated  Respiratory:  Symmetrical chest wall movement, Good air movement bilaterally, CTAB. Room Air  Cardiac:  RRR, No Murmurs, no LE edema noted, no JVD, No carotid bruits, peripheral pulses palpable at 2+  Abdomen:  Positive bowel sounds, Soft, Non tender, Non distended,  No masses appreciated, no obvious hepatosplenomegaly  Skin:  No Cyanosis, Normal Skin Turgor, No Skin Rash or Bruise.  Extremities: Symmetrical without obvious  trauma or injury,  no effusions.  Data Review  CBC  Recent Labs Lab 03/19/15 1038  WBC 13.4*  HGB 6.2*  HCT 18.9*  PLT 234  MCV 80.8  MCH 26.5  MCHC 32.8  RDW 13.4    Chemistries   Recent Labs Lab 03/19/15 1038  NA 139  K 3.3*  CL 94*  CO2 29  GLUCOSE 287*  BUN 101*  CREATININE 1.72*  CALCIUM 8.9  AST 20  ALT 12*  ALKPHOS 24*  BILITOT 0.5    CrCl cannot be calculated (Unknown ideal weight.).  No results for input(s): TSH, T4TOTAL, T3FREE, THYROIDAB in the last 72 hours.  Invalid input(s): FREET3  Coagulation profile No results for input(s): INR, PROTIME in the last 168 hours.  No results for input(s): DDIMER in the last 72 hours.  Cardiac Enzymes No results for input(s): CKMB, TROPONINI, MYOGLOBIN in the last 168 hours.  Invalid input(s): CK  Invalid input(s): POCBNP  Urinalysis    Component Value Date/Time   COLORURINE YELLOW 03/19/2015 [place] 03/19/2015 1058   LABSPEC 1.019 03/19/2015 1058   PHURINE 5.0 03/19/2015 [place] 03/19/2015 [place] 03/19/2015 [place] 03/19/2015 [place] 03/19/2015 [place] 03/19/2015 1058   UROBILINOGEN 0.2 03/19/2015 1058   NITRITE NEGATIVE 03/19/2015 1058   LEUKOCYTESUR SMALL* 03/19/2015 1058    Imaging results:   Ct Head Wo Contrast  03/19/2015   CLINICAL DATA:  Confusion, rectal bleeding, diabetes, hypertension, stroke,  hyperlipidemia, former smoker  EXAM: CT HEAD WITHOUT CONTRAST  TECHNIQUE: Contiguous axial images were obtained from the base of the skull through the vertex without intravenous contrast.  COMPARISON:  09/07/2013  FINDINGS: Chronic dilatation of the ventricular system.  No midline shift or mass effect.  Small vessel chronic ischemic changes of deep cerebral white matter.  Probable old small deep infarct LEFT cerebellum.  No intracranial hemorrhage, mass lesion or additional evidence of acute infarction.  No extra-axial fluid collections.  Atherosclerotic calcification of internal carotid and vertebral arteries at skullbase.  Bones and sinuses unremarkable.  IMPRESSION: Small vessel chronic ischemic changes of deep cerebral white matter.  Chronic ventriculomegaly question normal pressure hydrocephalus.  Probable small old lacunar infarct in deep LEFT cerebellar hemisphere.  No acute intracranial abnormalities.   Electronically Signed   By: Lavonia Dana M.D.   On: 03/19/2015 12:32     EKG: (Independently reviewed) sinus tachycardia, QTC 501 ms, voltage criteria met for LVH.   Assessment & Plan  Principal Problem:   Acute GI bleeding -admit to SDU -GI consulted- likely will undergo endoscopy later today -cont Protonix infusion -NPO -with elevated BUN suspect UGIB  Active Problems:   Metabolic encephalopathy -suspect underlying mild congniitive impairment magnified by hypotension/symptomatic anemia noting pt mentation has improved with restoration of BP    Hypotension/Symptomatic anemia -cont IVFs but hold during transfusion -in process of receiving 1/2 units PRBCs -ck coags for completeness -ck CB after 2nd unit infused and in am    Insulin dependent type 2 diabetes mellitus, uncontrolled -hold Metformin acutely -received a dose of LA insulin this am- given GIB/NPO will order Levemir BID but decrease dose to 15 units -ck HgbA1c -CBG with SSI q 4 hrs    Hypertension -resent hypotension  (see above) -did not receive meds PTA -hold antiHTN meds for now    Acute renal failure -2/2 to poor perfusion in setting of hypotension and  active GIB -follow labs    History of CVA/ CT head c/w Normal pressure hydrocephalus -will need PT/OT once hemodynamically stable -no reported issues concerning for dysphagia    Dyslipidemia -hold statin while NPO    Hypokalemia -follow and IV replete prn       DVT Prophylaxis: SCDs  Family Communication:   Husband and other family members at bedside with patient's permission  Code Status:  Full code  Condition:  Guarded  Discharge disposition: Anticipate will discharge back to home once etiology of GI bleeding determined and patient hemodynamically stable with no further evidence of ongoing bleeding  Time spent in minutes : 60      ELLIS,Dawn [name] on 03/19/2015 at 12:56 PM  Between 7am to 7pm - Pager - [phone]  After 7pm go to [email or web address] - password [email or web address]  And look for the night coverage person covering me after hours  [facility]

## 2015-03-19 NOTE — ED Notes (Signed)
[id number]. Notified RN

## 2015-03-19 NOTE — ED Notes (Signed)
Pt brought in via [facility]. Pt husband reporting pt has been altered and constipated all weekend. Pt husband gave the pt a laxative and found pt this morning by the bed with morning and noted bloody stools. Initial B/P was 70/42, SPO2 88, and HR 112. EMS gave pt 500 cc bolus which increased B/P to 98/60, and placed pt on 2L N/C which increased SPO2 to 98%. Pt has a history of HTN, CVA, and DM. Pt CBG was 359.

## 2015-03-19 NOTE — Consult Note (Signed)
[facility] Consult: 12:42 PM 03/19/2015     Referring Provider: Dr Cathleen Fears Primary Care Physician:  Dr. Tye Savoy Primary Gastroenterologist:  unassigned     Reason for Consultation:  GI bleed, anemia   HPI: Dawn [name] is a 64 y.o. female.  History TIA and stroke as well as normal pressure hydrocephalus. Type II diabetic, on oral agents.  HTN.  Peripheral neuropathy. On daily low dose ASA.   Normally the patient has a bowel movement every 2 or 3 days. However in the last 10 days or so she's not had bowel movements. She was given a dose of MiraLAX one week ago and again 4 days later, on Friday. She still didn't have a bowel movement. Her appetite has been decreased for a week or so. Within the last couple of weeks she's had a couple of episodes of emesis. Abdominal pain for about 5 days.  This morning her husband found her down on the floor. She appeared to be sleeping as she was arousable when he found her. It is not clear when she got onto the floor. In the commode there was black stool. Some black stool was also evident on the bed sheets. Patient doesn't recall having a syncopal episode.   she has been hypotensive for EMS as well as upon arrival to the ED. She received boluses of fluids and blood pressure is up to 126/70.  Rectal exam reveals melenic stool.  Hemoglobin is 6.2 with MCV of 80.  Her hemoglobin was 12.6 in mid February 2015. Platelets are in the 230s.  She has signs of renal insufficiency but BUN at 101 compared with creatinine at 1.7 to suggests an upper source of GI bleeding.  Her glucose is 287. LFTs are normal. Head CT shows stable small vessel ischemic changes, chronic ventriculomegaly, probable old lacunar CVA.    Patient has never had an upper endoscopy or colonoscopy though the latter  has been suggested, patient did not wish to proceed with colonoscopy.     Past Medical History  Diagnosis Date  . Diabetes mellitus without complication   . Hypertension   . Stroke 01/11/13    right corona radiata infarct  . Altered mental status 09/08/13    taken to [facility] ER  . 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 SVD (spontaneous vaginal delivery)     x 3  . Hyperlipidemia   . Shortness of breath     Hx in past 25 yrs ago  . GERD (gastroesophageal reflux disease)     Past Surgical History  Procedure Laterality Date  . Tubal ligation    . Dilatation & currettage/hysteroscopy with resectocope N/A 02/07/2014    Procedure: EXAM UNDER ANESTHESIA WITH BIOPSY, DILATATION & CURETTAGE/HYSTEROSCOPY;  Surgeon: Delice Lesch, MD;  Location: [facility];  Service: Gynecology;  Laterality: N/A;    Prior to Admission medications   Medication Sig Start Date End Date Taking? Authorizing Provider  amLODipine (NORVASC) 10 MG tablet Take 10 mg by mouth daily.  08/23/13  Yes Historical Provider, MD  aspirin 81 MG chewable tablet Chew 1 tablet (81 mg total) by mouth daily. 01/12/13  Yes Barton Dubois, MD  BENICAR HCT 20-12.5 MG per tablet Take 1 tablet by mouth daily.  08/28/13  Yes Historical Provider, MD  gabapentin (NEURONTIN) 100 MG capsule Take 1 capsule (100 mg total) by mouth 3 (three) times daily. 01/12/13  Yes Barton Dubois, MD  hydrALAZINE (APRESOLINE) 25 MG tablet Take 25 mg by mouth 2 (two) times daily. 01/24/15  Yes Historical Provider, MD  insulin detemir (LEVEMIR) 100 UNIT/ML injection Inject 20-28 Units into the skin 2 (two) times daily. Patient takes 28 units of Levemir in the morning and 20 in the evening   Yes Historical Provider, MD  metFORMIN (GLUCOPHAGE) 1000 MG tablet Take 1,000 mg by mouth 2 (two) times daily with a meal.   Yes Historical Provider, MD  Multiple Vitamins-Minerals (MULTIVITAMIN WITH MINERALS)  tablet Take 1 tablet by mouth daily.   Yes Historical Provider, MD  OVER THE COUNTER MEDICATION Take 1 tablet by mouth as needed. Patient uses [employer] brand of acid reducer as needed   Yes Historical Provider, MD  simvastatin (ZOCOR) 20 MG tablet Take 1 tablet (20 mg total) by mouth daily at 6 PM. 01/12/13  Yes Barton Dubois, MD    Scheduled Meds: . insulin aspart  0-9 Units Subcutaneous 6 times per day   Infusions: . sodium chloride 125 mL/hr at 03/19/15 1217   PRN Meds:    Allergies as of 03/19/2015 - Review Complete 03/19/2015  Allergen Reaction Noted  . Penicillins Swelling 05/11/2013    Family History  Problem Relation Age of Onset  . Hypertension Mother   . Hypertension Father     History   Social History  . Marital Status: Married    Spouse Name: Jenny Reichmann  . Number of Children: 3  . Years of Education: 12   Occupational History  . disability    Social History Main Topics  . Smoking status: Former Smoker -- 1.00 packs/day for 20 years    Types: Cigarettes    Quit date: 10/20/1983  . Smokeless tobacco: Never Used  . Alcohol Use: No  . Drug Use: No  . Sexual Activity: Yes    Birth Control/ Protection: Post-menopausal   Other Topics Concern  . Not on file   Social History Narrative    REVIEW OF SYSTEMS: Constitutinal:  Husband feels like her weight is pretty stable. ENT:  No nose bleeds Pulm:  No shortness of breath, no cough. CV:  No palpitations, no LE edema.  GU:  No hematuria, no frequency GI:  :  Per HPI.  Heme:  No previous issues with low blood counts. No issues with unusual bleeding or bruising.   TransfNone ever before. Neuro:  No headaches, no peripheral tingling or numbness Derm:  No itching, no rash or sores.  Endocrine:  No sweats or chills.  No polyuria or dysuria Immunization: Did not inquire. Travel:  None beyond local counties in last few months.    PHYSICAL EXAM: Vital signs in last 24 hours: Filed Vitals:   03/19/15 1232  BP:  126/70  Pulse:   Temp: 98.7 F (37.1 C)  Resp: 19   Wt Readings from Last 3 Encounters:  02/02/14 156 lb (70.761 kg)  09/26/13 160 lb (72.576 kg)  09/08/13 158 lb 6.4 oz (71.85 kg)    GeneraPale, chronically ill-appearing, comfortable AAF Head:  No signs of head trauma. No facial asymmetry.  Eyes:  No scleral icterus. Conjunctiva is pale. Ears: Hearing appears to be intact.  Nose: No congestion or discharge. Mouth:  no teeth. Mucous membranes moist, pink. Neck:  No thyromegaly, no adenopathy. No TMG. Lungs:Clear to auscultation bilaterally. No labored breathing or cough. Heart: RRR. No MRG. S1/S2 audible.  Abdomen:  Soft,NT, ND.  Bowel sounds present. No masses. No HSM. No bruits..   Rectal: black, melenic stool per ED physician.    Musc/Skeltlno joint swelling, contracture deformities or redness. Extremities:  No CCE.  Neurologic:  alert, appropriate. Moves all 4 limbs. No tremor. Limb strength not tested. Skin:  No rash, no telangiectasia, no sores. Tattoo  None Nodes:  No cervical or inguinal adenopathy.   Psych:  Cooperative. Affect bland.  Intake/Output from previous day:   Intake/Output this shift: Total I/O In: 30 [Blood:30] Out: -   LAB RESULTS:  Recent Labs  03/19/15 1038  WBC 13.4*  HGB 6.2*  HCT 18.9*  PLT 234   BMET Lab Results  Component Value Date   NA 139 03/19/2015   NA 138 02/02/2014   NA 135 09/08/2013   K 3.3* 03/19/2015   K 3.7 02/02/2014   K 3.6 09/08/2013   CL 94* 03/19/2015   CL 94* 02/02/2014   CL 97 09/08/2013   CO2 29 03/19/2015   CO2 30 02/02/2014   CO2 23 09/08/2013   GLUCOSE 287* 03/19/2015   GLUCOSE 134* 02/02/2014   GLUCOSE 188* 09/08/2013   BUN 101* 03/19/2015   BUN 18 02/02/2014   BUN 21 09/08/2013   CREATININE 1.72* 03/19/2015   CREATININE 0.87 02/02/2014   CREATININE 0.87 09/08/2013   CALCIUM 8.9 03/19/2015   CALCIUM 9.7 02/02/2014   CALCIUM 9.4 09/08/2013   LFT  Recent Labs  03/19/15 1038  PROT 5.5*    ALBUMIN 3.1*  AST 20  ALT 12*  ALKPHOS 24*  BILITOT 0.5   PT/INR Lab Results  Component Value Date   INR 1.19 09/08/2013   Hepatitis Panel No results for input(s): HEPBSAG, HCVAB, HEPAIGM, HEPBIGM in the last 72 hours. C-Diff No components found for: CDIFF Lipase  No results found for: LIPASE  Drugs of Abuse  No results found for: LABOPIA, COCAINSCRNUR, LABBENZ, AMPHETMU, THCU, LABBARB   RADIOLOGY STUDIES: Ct Head Wo Contrast  03/19/2015   CLINICAL DATA:  Confusion, rectal bleeding, diabetes, hypertension, stroke, hyperlipidemia, former smoker  EXAM: CT HEAD WITHOUT CONTRAST  TECHNIQUE: Contiguous axial images were obtained from the base of the skull through the vertex without intravenous contrast.  COMPARISON:  09/07/2013  FINDINGS: Chronic dilatation of the ventricular system.  No midline shift or mass effect.  Small vessel chronic ischemic changes of deep cerebral white matter.  Probable old small deep infarct LEFT cerebellum.  No intracranial hemorrhage, mass lesion or additional evidence of acute infarction.  No extra-axial fluid collections.  Atherosclerotic calcification of internal carotid and vertebral arteries at skullbase.  Bones and sinuses unremarkable.  IMPRESSION: Small vessel chronic ischemic changes of deep cerebral white matter.  Chronic ventriculomegaly question normal pressure hydrocephalus.  Probable small old lacunar infarct in deep LEFT cerebellar hemisphere.  No acute intracranial abnormalities.   Electronically Signed   By: Lavonia Dana M.D.   On: 03/19/2015 12:32    ENDOSCOPIC STUDIES: None ever   IMPRESSION:   *  GI bleed. Suspect upper GI bleed. Rule out ulcer, rule out neoplasm   *  Likely syncope. CT head negative for acute events. Hypotension on arrival has improved with  IV fluids.  *  Constipation.  Will eventually need a colonoscopy.  *  AKI  *  DM 2   PLAN:     *  EGD, timing per Dr Olevia Perches.  Check coags before that.   *  Has received  bolus of Protonix. The IV infusion of Protonix will be started.  PRBCs x 2 have been ordered but not yet started.   Azucena Freed  03/19/2015, 12:42 PM Pager: [phone] Give first dose of Moviprep at 7pm today. Drink 8 ounces every 15-20 minutes until complete. Follow with 16oz of clear liquids. At 7am tomorrow morning give 2nd dose of Moviprep. Drink 8 ounces every 15-20 minutes until complete. Patient should be done with prep and NPO by no later than 9am.    Attending MD note:   I have taken a history, examined the patient, and reviewed the chart. I agree with the Advanced Practitioner's impression and recommendations.   [facility] Pager # [phone]

## 2015-03-19 NOTE — ED Provider Notes (Signed)
CSN: [id number]     Arrival date & time 03/19/15  1017 History   First MD Initiated Contact with Patient 03/19/15 1020     Chief Complaint  Patient presents with  . GI Bleeding   Level V caveat altered mental status. History is obtained from her husband and from paramedics  (Consider location/radiation/quality/duration/timing/severity/associated sxs/prior Treatment) HPI Patient was found on the floor this morning by her husband. He slept in a different room last night. He noted her to be confused for the past 3 days. EMS noted rectal bleeding. CBG was obtained which was 359. She was treated with supplemental oxygen and administered saline bolus 500 mL intravenously in the field. Past Medical History  Diagnosis Date  . Diabetes mellitus without complication   . Hypertension   . Stroke 01/11/13    right corona radiata infarct  . Altered mental status 09/08/13    taken to [facility] ER  . 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 SVD (spontaneous vaginal delivery)     x 3  . Hyperlipidemia   . Shortness of breath     Hx in past 25 yrs ago  . GERD (gastroesophageal reflux disease)    Past Surgical History  Procedure Laterality Date  . Tubal ligation    . Dilatation & currettage/hysteroscopy with resectocope N/A 02/07/2014    Procedure: EXAM UNDER ANESTHESIA WITH BIOPSY, DILATATION & CURETTAGE/HYSTEROSCOPY;  Surgeon: Delice Lesch, MD;  Location: [facility];  Service: Gynecology;  Laterality: N/A;   Family History  Problem Relation Age of Onset  . Hypertension Mother   . Hypertension Father    History  Substance Use Topics  . Smoking status: Former Smoker -- 1.00 packs/day for 20 years    Types: Cigarettes    Quit date: 10/20/1983  . Smokeless tobacco: Never Used  . Alcohol Use: No   OB History    No data available     Review of Systems  Unable to perform ROS: Mental status change  Gastrointestinal:  Positive for blood in stool.      Allergies  Penicillins  Home Medications   Prior to Admission medications   Medication Sig Start Date End Date Taking? Authorizing Provider  amLODipine (NORVASC) 10 MG tablet Take 10 mg by mouth daily.  08/23/13   Historical Provider, MD  aspirin 81 MG chewable tablet Chew 1 tablet (81 mg total) by mouth daily. 01/12/13   Barton Dubois, MD  BENICAR HCT 20-12.5 MG per tablet Take 1 tablet by mouth daily.  08/28/13   Historical Provider, MD  gabapentin (NEURONTIN) 100 MG capsule Take 1 capsule (100 mg total) by mouth 3 (three) times daily. 01/12/13   Barton Dubois, MD  insulin detemir (LEVEMIR) 100 UNIT/ML injection Inject 22-25 Units into the skin 2 (two) times daily. Patient takes 25 units of Levemir in the morning and 22 in the evening    Historical Provider, MD  metFORMIN (GLUCOPHAGE) 1000 MG tablet Take 1,000 mg by mouth 2 (two) times daily with a meal.    Historical Provider, MD  Multiple Vitamins-Minerals (MULTIVITAMIN WITH MINERALS) tablet Take 1 tablet by mouth daily.    Historical Provider, MD  OVER THE COUNTER MEDICATION Take 1 tablet by mouth as needed. Patient uses [employer] brand of acid reducer as needed    Historical Provider, MD  simvastatin (ZOCOR) 20 MG tablet Take 1 tablet (20 mg total) by mouth daily at 6 PM. 01/12/13  Barton Dubois, MD   SpO2 88% Physical Exam  Constitutional: No distress.  Chronically ill-appearing  HENT:  Head: Normocephalic and atraumatic.  Right Ear: External ear normal.  Left Ear: External ear normal.  Eyes: Pupils are equal, round, and reactive to light.  Conjunctiva pale  Neck: Neck supple. No tracheal deviation present. No thyromegaly present.  Cardiovascular: Normal rate and regular rhythm.   No murmur heard. Pulmonary/Chest: Effort normal and breath sounds normal.  Abdominal: Soft. Bowel sounds are normal. She exhibits no distension. There is no tenderness.  Genitourinary: Guaiac positive stool.  Normal  tone and black melanotic stool  Musculoskeletal: Normal range of motion. She exhibits no edema or tenderness.  Neurological: She is alert. Coordination normal.  Oriented to name and hospital does not know month or year. Follow simple commands moves all extremities well. Cranial nerves II through XII grossly intact  Skin: Skin is warm and dry. No rash noted.  Psychiatric: She has a normal mood and affect.  Nursing note and vitals reviewed.   ED Course  Procedures (including critical care time) Labs Review Labs Reviewed - No data to display  Imaging Review No results found.   EKG Interpretation None     ED ECG REPORT   Date: 03/19/2015  Rate: 105  Rhythm: sinus tachycardia  QRS Axis: left  Intervals: normal  ST/T Wave abnormalities: normal  Conduction Disutrbances:none  Narrative Interpretation:   Old EKG Reviewed: unchanged  I have personally reviewed the EKG tracing and agree with the computerized printout as noted. Saline intravenous bolus ordered. Type and screen ordered.  11:21 AM act red blood is fusion ordered after hemoglobin resulted 6.2. Results for orders placed or performed during the hospital encounter of 03/19/15  CBC  Result Value Ref Range   WBC 13.4 (H) 4.0 - 10.5 K/uL   RBC 2.34 (L) 3.87 - 5.11 MIL/uL   Hemoglobin 6.2 (LL) 12.0 - 15.0 g/dL   HCT 18.9 (L) 36.0 - 46.0 %   MCV 80.8 78.0 - 100.0 fL   MCH 26.5 26.0 - 34.0 pg   MCHC 32.8 30.0 - 36.0 g/dL   RDW 13.4 11.5 - 15.5 %   Platelets 234 150 - 400 K/uL  Comprehensive metabolic panel  Result Value Ref Range   Sodium 139 135 - 145 mmol/L   Potassium 3.3 (L) 3.5 - 5.1 mmol/L   Chloride 94 (L) 101 - 111 mmol/L   CO2 29 22 - 32 mmol/L   Glucose, Bld 287 (H) 65 - 99 mg/dL   BUN 101 (H) 6 - 20 mg/dL   Creatinine, Ser 1.72 (H) 0.44 - 1.00 mg/dL   Calcium 8.9 8.9 - 10.3 mg/dL   Total Protein 5.5 (L) 6.5 - 8.1 g/dL   Albumin 3.1 (L) 3.5 - 5.0 g/dL   AST 20 15 - 41 U/L   ALT 12 (L) 14 - 54 U/L    Alkaline Phosphatase 24 (L) 38 - 126 U/L   Total Bilirubin 0.5 0.3 - 1.2 mg/dL   GFR calc non Af Amer 31 (L) >60 mL/min   GFR calc Af Amer 36 (L) >60 mL/min   Anion gap 16 (H) 5 - 15  Urinalysis, Routine w reflex microscopic (not at [facility])  Result Value Ref Range   Color, Urine YELLOW YELLOW   APPearance CLEAR CLEAR   Specific Gravity, Urine 1.019 1.005 - 1.030   pH 5.0 5.0 - 8.0   Glucose, UA NEGATIVE NEGATIVE mg/dL   Hgb urine dipstick NEGATIVE  NEGATIVE   Bilirubin Urine NEGATIVE NEGATIVE   Ketones, ur NEGATIVE NEGATIVE mg/dL   Protein, ur NEGATIVE NEGATIVE mg/dL   Urobilinogen, UA 0.2 0.0 - 1.0 mg/dL   Nitrite NEGATIVE NEGATIVE   Leukocytes, UA SMALL (A) NEGATIVE  Urine microscopic-add on  Result Value Ref Range   Squamous Epithelial / LPF RARE RARE   WBC, UA 3-6 <3 WBC/hpf   RBC / HPF 0-2 <3 RBC/hpf   Bacteria, UA RARE RARE   Casts HYALINE CASTS (A) NEGATIVE  POC occult blood, ED Provider will collect  Result Value Ref Range   Fecal Occult Bld POSITIVE (A) NEGATIVE  Type and screen  Result Value Ref Range   ABO/RH(D) B NEG    Antibody Screen NEG    Sample Expiration 03/22/2015    Unit Number [id number]    Blood Component Type RED CELLS,LR    Unit division 00    Status of Unit ISSUED    Transfusion Status OK TO TRANSFUSE    Crossmatch Result Compatible    Unit Number [id number]    Blood Component Type RBC LR PHER2    Unit division 00    Status of Unit ALLOCATED    Transfusion Status OK TO TRANSFUSE    Crossmatch Result Compatible   ABO/Rh  Result Value Ref Range   ABO/RH(D) B NEG   Prepare RBC  Result Value Ref Range   Order Confirmation ORDER PROCESSED BY BLOOD BANK    Ct Head Wo Contrast  03/19/2015   CLINICAL DATA:  Confusion, rectal bleeding, diabetes, hypertension, stroke, hyperlipidemia, former smoker  EXAM: CT HEAD WITHOUT CONTRAST  TECHNIQUE: Contiguous axial images were obtained from the base of the skull through the vertex without intravenous  contrast.  COMPARISON:  09/07/2013  FINDINGS: Chronic dilatation of the ventricular system.  No midline shift or mass effect.  Small vessel chronic ischemic changes of deep cerebral white matter.  Probable old small deep infarct LEFT cerebellum.  No intracranial hemorrhage, mass lesion or additional evidence of acute infarction.  No extra-axial fluid collections.  Atherosclerotic calcification of internal carotid and vertebral arteries at skullbase.  Bones and sinuses unremarkable.  IMPRESSION: Small vessel chronic ischemic changes of deep cerebral white matter.  Chronic ventriculomegaly question normal pressure hydrocephalus.  Probable small old lacunar infarct in deep LEFT cerebellar hemisphere.  No acute intracranial abnormalities.   Electronically Signed   By: Lavonia Dana M.D.   On: 03/19/2015 12:32   . MDM  Patient felt to be exhibiting symptomatically anemia. 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 Final diagnoses:  None   gastroenterology service called to evaluate patient and will consult on case. I also contacted hospitalist service who will arrange for admission.  Pt will be admitted to stepdown unit. transfusion with prbcs,ppi,  Dx Acute Gi bleeding #2symptomatic anemia #3 acute encephalopathy #4 hypokalemia CRITICAL CARE Performed by: Orlie Dakin Total critical care time:  40 minutes Critical care time was exclusive of separately billable procedures and treating other patients. Critical care was necessary to treat or prevent imminent or life-threatening deterioration. Critical care was time spent personally by me on the following activities: development of treatment plan with patient and/or surrogate as well as nursing, discussions with consultants, evaluation of patient's response to treatment, examination of patient, obtaining history from patient or surrogate, ordering and performing treatments and interventions, ordering and review of laboratory studies, ordering and review of radiographic  studies, pulse oximetry and re-evaluation of patient's condition.    Orlie Dakin, MD 03/19/15 1250

## 2015-03-19 NOTE — ED Notes (Signed)
Attempted to call CT phone system busy. Spoke to CT in CT able to transport patient now. Nurse transported patient to CT and returned on cardiac monitor.

## 2015-03-20 ENCOUNTER — Encounter (HOSPITAL_COMMUNITY): Payer: Self-pay | Admitting: Internal Medicine

## 2015-03-20 DIAGNOSIS — E876 Hypokalemia: Secondary | ICD-10-CM

## 2015-03-20 DIAGNOSIS — N179 Acute kidney failure, unspecified: Secondary | ICD-10-CM

## 2015-03-20 DIAGNOSIS — K264 Chronic or unspecified duodenal ulcer with hemorrhage: Secondary | ICD-10-CM

## 2015-03-20 DIAGNOSIS — I959 Hypotension, unspecified: Secondary | ICD-10-CM

## 2015-03-20 LAB — COMPREHENSIVE METABOLIC PANEL
ALT: 12 U/L — ABNORMAL LOW (ref 14–54)
AST: 19 U/L (ref 15–41)
Albumin: 3.3 g/dL — ABNORMAL LOW (ref 3.5–5.0)
Alkaline Phosphatase: 28 U/L — ABNORMAL LOW (ref 38–126)
Anion gap: 10 (ref 5–15)
BILIRUBIN TOTAL: 1.1 mg/dL (ref 0.3–1.2)
BUN: 47 mg/dL — ABNORMAL HIGH (ref 6–20)
CALCIUM: 8.4 mg/dL — AB (ref 8.9–10.3)
CHLORIDE: 103 mmol/L (ref 101–111)
CO2: 27 mmol/L (ref 22–32)
Creatinine, Ser: 0.9 mg/dL (ref 0.44–1.00)
GFR calc Af Amer: >60 mL/min (ref >60)
GFR calc non Af Amer: >60 mL/min (ref >60)
GLUCOSE: 113 mg/dL — AB (ref 65–99)
Potassium: 3.6 mmol/L (ref 3.5–5.1)
SODIUM: 140 mmol/L (ref 135–145)
Total Protein: 6.4 g/dL — ABNORMAL LOW (ref 6.5–8.1)

## 2015-03-20 LAB — CBC
HEMATOCRIT: 26.4 % — AB (ref 36.0–46.0)
HEMOGLOBIN: 8.9 g/dL — AB (ref 12.0–15.0)
MCH: 27.4 pg (ref 26.0–34.0)
MCHC: 33.7 g/dL (ref 30.0–36.0)
MCV: 81.2 fL (ref 78.0–100.0)
Platelets: 192 10*3/uL (ref 150–400)
RBC: 3.25 MIL/uL — ABNORMAL LOW (ref 3.87–5.11)
RDW: 13.8 % (ref 11.5–15.5)
WBC: 11.8 10*3/uL — ABNORMAL HIGH (ref 4.0–10.5)

## 2015-03-20 LAB — TYPE AND SCREEN
ABO/RH(D): B NEG
Antibody Screen: NEGATIVE
Unit division: 0
Unit division: 0

## 2015-03-20 LAB — GLUCOSE, CAPILLARY
GLUCOSE-CAPILLARY: 112 mg/dL — AB (ref 65–99)
GLUCOSE-CAPILLARY: 111 mg/dL — AB (ref 65–99)
Glucose-Capillary: 106 mg/dL — ABNORMAL HIGH (ref 65–99)
Glucose-Capillary: 123 mg/dL — ABNORMAL HIGH (ref 65–99)
Glucose-Capillary: 156 mg/dL — ABNORMAL HIGH (ref 65–99)
Glucose-Capillary: 297 mg/dL — ABNORMAL HIGH (ref 65–99)

## 2015-03-20 MED ORDER — INSULIN ASPART 100 UNIT/ML ~~LOC~~ SOLN
0.0000 [IU] | Freq: Three times a day (TID) | SUBCUTANEOUS | Status: DC
  Administered 2015-03-20: 2 [IU] via SUBCUTANEOUS
  Administered 2015-03-21: 3 [IU] via SUBCUTANEOUS

## 2015-03-20 MED ORDER — PANTOPRAZOLE SODIUM 40 MG PO TBEC
40.0000 mg | DELAYED_RELEASE_TABLET | Freq: Every day | ORAL | Status: DC
  Administered 2015-03-20 – 2015-03-21 (×2): 40 mg via ORAL
  Filled 2015-03-20 (×2): qty 1

## 2015-03-20 NOTE — Progress Notes (Addendum)
PROGRESS NOTE    Dawn [name] EZM:[id number] DOB: 02/08/1951 DOA: 03/19/2015 PCP: No primary care provider on file.  HPI/Brief narrative 64 year old female with prior history of CVA, diabetes mellitus, hypertension, dyslipidemia and GERD, presents with bright red blood per rectum, lethargy, in ED workup was significant for hypotension, blood pressure 70/42, anemia with hemoglobin of 6.2, patient denies any NSAID use, only on aspirin, husband reports of vomiting, but no coffee-ground emesis, s/p transfused 2 units packed red blood cell, on Protonix drip. S/P EGD> non bleeding duodenal bulb ulcer> hemo clipped.    Assessment/Plan:  Principal Problem:  Acute GI bleeding - Admitted to stepdown unit for close monitoring. GI was consult at. Patient underwent EGD which showed large but nonbleeding duodenal bulb ulcer which was hemoclipped. - She was on protonix infusion which has been switched to oral on 6/1 - As per GI follow-up today, if H pylori is positive, add triple therapy - Patient has never had a colonoscopy and may need to consider this as outpatient - GI signed off 6/1   Active Problems:  Metabolic encephalopathy - suspect underlying mild congniitive impairment magnified by hypotension/symptomatic anemia noting pt mentation has improved with restoration of BP - Mental status changes have resolved   Hypotension - Likely secondary to acute blood loss - Resolved after blood transfusions  Acute blood loss anemia - Secondary to upper GI bleed - Status post 2 units of PRBCs. Hemoglobin has improved from 6.2-8.9. - Check CBC in a.m. and if hemoglobin stable, DC home   Insulin dependent type 2 diabetes mellitus, uncontrolled - hold Metformin acutely - Patient on reduced dose of Levemir. CBGs reasonably controlled   Hypertension - resent hypotension (see above) - did not receive meds PTA - hold antiHTN meds for now   Acute renal failure -2/2 to poor perfusion in  setting of hypotension and active GIB - Resolved   History of CVA/ CT head c/w Normal pressure hydrocephalus - will need PT/OT once hemodynamically stable - no reported issues concerning for dysphagia   Dyslipidemia - hold statin while NPO   Hypokalemia - Improved  DVT prophylaxis: SCDs Code Status: Full Family Communication: Discussed with spouse at bedside. Disposition Plan: DC home when medically stable, possibly 6/2. Transfer to medical bed 6/1.   Consultants:  [facility]  Procedures:  EGD  Antibiotics:  None   Subjective: No further rectal bleeding. Spouse indicates one episode of "burgundy colored stools" prior to admission  Objective: Filed Vitals:   03/20/15 0500 03/20/15 0720 03/20/15 0737 03/20/15 1206  BP:   124/83 109/68  Pulse:  95 94 100  Temp:   98 F (36.7 C) 97.9 F (36.6 C)  TempSrc:   Oral Oral  Resp:  15 12 16   Height:      Weight: 74.1 kg (163 lb 5.8 oz)     SpO2:  100% 100% 95%    Intake/Output Summary (Last 24 hours) at 03/20/15 1509 Last data filed at 03/20/15 1400  Gross per 24 hour  Intake 2755.5 ml  Output    850 ml  Net 1905.5 ml   Filed Weights   03/19/15 1640 03/20/15 0500  Weight: 75.1 kg (165 lb 9.1 oz) 74.1 kg (163 lb 5.8 oz)     Exam:  General exam: Pleasant middle-aged female lying comfortably in bed. Respiratory system: Clear. No increased work of breathing. Cardiovascular system: S1 & S2 heard, RRR. No JVD, murmurs, gallops, clicks or pedal edema. Telemetry: Sinus rhythm Gastrointestinal system: Abdomen is  nondistended, soft and nontender. Normal bowel sounds heard. Central nervous system: Alert and oriented. No focal neurological deficits. Extremities: Symmetric 5 x 5 power.   Data Reviewed: Basic Metabolic Panel:  Recent Labs Lab 03/19/15 1038 03/20/15 0314  NA 139 140  K 3.3* 3.6  CL 94* 103  CO2 29 27  GLUCOSE 287* 113*  BUN 101* 47*  CREATININE 1.72* 0.90  CALCIUM 8.9 8.4*   Liver  Function Tests:  Recent Labs Lab 03/19/15 1038 03/20/15 0314  AST 20 19  ALT 12* 12*  ALKPHOS 24* 28*  BILITOT 0.5 1.1  PROT 5.5* 6.4*  ALBUMIN 3.1* 3.3*   No results for input(s): LIPASE, AMYLASE in the last 168 hours. No results for input(s): AMMONIA in the last 168 hours. CBC:  Recent Labs Lab 03/19/15 1038 03/20/15 0314  WBC 13.4* 11.8*  HGB 6.2* 8.9*  HCT 18.9* 26.4*  MCV 80.8 81.2  PLT 234 192   Cardiac Enzymes: No results for input(s): CKTOTAL, CKMB, CKMBINDEX, TROPONINI in the last 168 hours. BNP (last 3 results) No results for input(s): PROBNP in the last 8760 hours. CBG:  Recent Labs Lab 03/19/15 2040 03/19/15 2358 03/20/15 0404 03/20/15 0756 03/20/15 1210  GLUCAP 159* 123* 112* 106* 111*    Recent Results (from the past 240 hour(s))  MRSA PCR Screening     Status: None   Collection Time: 03/19/15  4:56 PM  Result Value Ref Range Status   MRSA by PCR NEGATIVE NEGATIVE Final    Comment:        The GeneXpert MRSA Assay (FDA approved for NASAL specimens only), is one component of a comprehensive MRSA colonization surveillance program. It is not intended to diagnose MRSA infection nor to guide or monitor treatment for MRSA infections.           Studies: Ct Head Wo Contrast  03/19/2015   CLINICAL DATA:  Confusion, rectal bleeding, diabetes, hypertension, stroke, hyperlipidemia, former smoker  EXAM: CT HEAD WITHOUT CONTRAST  TECHNIQUE: Contiguous axial images were obtained from the base of the skull through the vertex without intravenous contrast.  COMPARISON:  09/07/2013  FINDINGS: Chronic dilatation of the ventricular system.  No midline shift or mass effect.  Small vessel chronic ischemic changes of deep cerebral white matter.  Probable old small deep infarct LEFT cerebellum.  No intracranial hemorrhage, mass lesion or additional evidence of acute infarction.  No extra-axial fluid collections.  Atherosclerotic calcification of internal  carotid and vertebral arteries at skullbase.  Bones and sinuses unremarkable.  IMPRESSION: Small vessel chronic ischemic changes of deep cerebral white matter.  Chronic ventriculomegaly question normal pressure hydrocephalus.  Probable small old lacunar infarct in deep LEFT cerebellar hemisphere.  No acute intracranial abnormalities.   Electronically Signed   By: Lavonia Dana M.D.   On: 03/19/2015 12:32        Scheduled Meds: . insulin aspart  0-9 Units Subcutaneous 6 times per day  . insulin detemir  15 Units Subcutaneous BID  . pantoprazole  40 mg Oral Q0600  . simvastatin  20 mg Oral q1800  . sodium chloride  3 mL Intravenous Q12H   Continuous Infusions: . sodium chloride 125 mL/hr at 03/20/15 0536    Principal Problem:   Acute GI bleeding Active Problems:   Insulin dependent type 2 diabetes mellitus, uncontrolled   Hypertension   History of CVA (cerebrovascular accident)   Dyslipidemia   Metabolic encephalopathy   Hypotension   Symptomatic anemia   Acute renal failure  Hypokalemia   CT head c/w Normal pressure hydrocephalus   GI bleed    Time spent: 20 minutes    Rian Busche, MD, FACP, FHM. [facility] Pager [phone]  If 7PM-7AM, please contact night-coverage [email or web address] Password [email or web address] 03/20/2015, 3:09 PM    LOS: 1 day

## 2015-03-20 NOTE — Progress Notes (Signed)
Daily Rounding Note  03/20/2015, 10:56 AM  LOS: 1 day   SUBJECTIVE:       No bm today.  hungryu  OBJECTIVE:         Vital signs in last 24 hours:    Temp:  [97.4 F (36.3 C)-98.7 F (37.1 C)] 98 F (36.7 C) (06/01 0737) Pulse Rate:  [94-127] 94 (06/01 0737) Resp:  [12-29] 12 (06/01 0737) BP: (91-159)/(54-93) 124/83 mmHg (06/01 0737) SpO2:  [96 %-100 %] 100 % (06/01 0737) Weight:  [163 lb 5.8 oz (74.1 kg)-165 lb 9.1 oz (75.1 kg)] 163 lb 5.8 oz (74.1 kg) (06/01 0500) Last BM Date: 03/19/15 Filed Weights   03/19/15 1640 03/20/15 0500  Weight: 165 lb 9.1 oz (75.1 kg) 163 lb 5.8 oz (74.1 kg)   General: looks chronically unwell.  comfortable   Heart: RRR Chest: clear bil Abdomen: NT, ND.  Active BS  Extremities: no CCE Neuro/Psych:  Oriented x 3. Appropriate. Moves all 4s.   Intake/Output from previous day: 05/31 0701 - 06/01 0700 In: 1980.5 [I.V.:1337.5; Blood:643] Out: 750 [Urine:750]  Intake/Output this shift: Total I/O In: -  Out: 400 [Urine:400]  Lab Results:  Recent Labs  03/19/15 1038 03/20/15 0314  WBC 13.4* 11.8*  HGB 6.2* 8.9*  HCT 18.9* 26.4*  PLT 234 192   BMET  Recent Labs  03/19/15 1038 03/20/15 0314  NA 139 140  K 3.3* 3.6  CL 94* 103  CO2 29 27  GLUCOSE 287* 113*  BUN 101* 47*  CREATININE 1.72* 0.90  CALCIUM 8.9 8.4*   LFT  Recent Labs  03/19/15 1038 03/20/15 0314  PROT 5.5* 6.4*  ALBUMIN 3.1* 3.3*  AST 20 19  ALT 12* 12*  ALKPHOS 24* 28*  BILITOT 0.5 1.1   PT/INR No results for input(s): LABPROT, INR in the last 72 hours. Hepatitis Panel No results for input(s): HEPBSAG, HCVAB, HEPAIGM, HEPBIGM in the last 72 hours.  Studies/Results: Ct Head Wo Contrast  03/19/2015   CLINICAL DATA:  Confusion, rectal bleeding, diabetes, hypertension, stroke, hyperlipidemia, former smoker  EXAM: CT HEAD WITHOUT CONTRAST  TECHNIQUE: Contiguous axial images were obtained from the  base of the skull through the vertex without intravenous contrast.  COMPARISON:  09/07/2013  FINDINGS: Chronic dilatation of the ventricular system.  No midline shift or mass effect.  Small vessel chronic ischemic changes of deep cerebral white matter.  Probable old small deep infarct LEFT cerebellum.  No intracranial hemorrhage, mass lesion or additional evidence of acute infarction.  No extra-axial fluid collections.  Atherosclerotic calcification of internal carotid and vertebral arteries at skullbase.  Bones and sinuses unremarkable.  IMPRESSION: Small vessel chronic ischemic changes of deep cerebral white matter.  Chronic ventriculomegaly question normal pressure hydrocephalus.  Probable small old lacunar infarct in deep LEFT cerebellar hemisphere.  No acute intracranial abnormalities.   Electronically Signed   By: Lavonia Dana M.D.   On: 03/19/2015 12:32   Scheduled Meds: . insulin aspart  0-9 Units Subcutaneous 6 times per day  . insulin detemir  15 Units Subcutaneous BID  . [START ON 03/21/2015] pantoprazole (PROTONIX) IV  40 mg Intravenous Q12H  . simvastatin  20 mg Oral q1800  . sodium chloride  3 mL Intravenous Q12H   Continuous Infusions: . sodium chloride 125 mL/hr at 03/20/15 0536  . pantoprozole (PROTONIX) infusion 8 mg/hr (03/20/15 0901)   PRN Meds:.morphine injection, ondansetron **OR** ondansetron (ZOFRAN) IV   ASSESMENT:   *  UGIB.   EGD 5/31 with solitary duodenal bulb ulcer.  Not bleeding.  endoclips placed x 2.  HH noted.   *  ABL anemia.  PRBCs x 2 in last 2 hours. Hgb improved.  *  Constipation.  Never had colonoscopy  *  AKI  *  DM2.    PLAN   *  Switch to oral  PPI. On no PPI PTA.  If H pylori is +: add triple therapy.  I just ordered the serum H pylori test now.  Carb mod diet.   *  ROV with GI 7/15.  Will sign off.    Dawn [name]  03/20/2015, 10:56 AM Pager: [phone] Attending MD note:   I have taken a history, and reviewed the chart. I agree with  the Advanced Practitioner's impression and recommendations. Stable after blood transfusion.  H.Pylori pending.Please call back prn  [facility] Pager # [phone]

## 2015-03-20 NOTE — Progress Notes (Signed)
Utilization Review Completed.  

## 2015-03-21 DIAGNOSIS — G9341 Metabolic encephalopathy: Secondary | ICD-10-CM

## 2015-03-21 DIAGNOSIS — Z794 Long term (current) use of insulin: Secondary | ICD-10-CM

## 2015-03-21 DIAGNOSIS — D62 Acute posthemorrhagic anemia: Secondary | ICD-10-CM

## 2015-03-21 DIAGNOSIS — E1165 Type 2 diabetes mellitus with hyperglycemia: Secondary | ICD-10-CM

## 2015-03-21 LAB — CBC
HEMATOCRIT: 25.1 % — AB (ref 36.0–46.0)
Hemoglobin: 8.6 g/dL — ABNORMAL LOW (ref 12.0–15.0)
MCH: 27.8 pg (ref 26.0–34.0)
MCHC: 34.3 g/dL (ref 30.0–36.0)
MCV: 81.2 fL (ref 78.0–100.0)
Platelets: 191 10*3/uL (ref 150–400)
RBC: 3.09 MIL/uL — ABNORMAL LOW (ref 3.87–5.11)
RDW: 13.8 % (ref 11.5–15.5)
WBC: 11.4 10*3/uL — ABNORMAL HIGH (ref 4.0–10.5)

## 2015-03-21 LAB — GLUCOSE, CAPILLARY
Glucose-Capillary: 102 mg/dL — ABNORMAL HIGH (ref 65–99)
Glucose-Capillary: 202 mg/dL — ABNORMAL HIGH (ref 65–99)

## 2015-03-21 LAB — H PYLORI, IGM, IGG, IGA AB
H Pylori IgG: <0.9 U/mL (ref 0.0–0.8)
H. Pylogi, Iga Abs: >35 units — ABNORMAL HIGH (ref 0.0–8.9)

## 2015-03-21 MED ORDER — ASPIRIN 81 MG PO CHEW: 81.0000 mg | CHEWABLE_TABLET | Freq: Every day | ORAL | Status: AC

## 2015-03-21 MED ORDER — PANTOPRAZOLE SODIUM 40 MG PO TBEC: 40.0000 mg | DELAYED_RELEASE_TABLET | Freq: Every day | ORAL | Status: DC

## 2015-03-21 NOTE — Discharge Instructions (Signed)
Gastrointestinal Bleeding Gastrointestinal (GI) bleeding means there is bleeding somewhere along the digestive tract, between the mouth and anus. CAUSES  There are many different problems that can cause GI bleeding. Possible causes include:  Esophagitis. This is inflammation, irritation, or swelling of the esophagus.  Hemorrhoids.These are veins that are full of blood (engorged) in the rectum. They cause pain, inflammation, and may bleed.  Anal fissures.These are areas of painful tearing which may bleed. They are often caused by passing hard stool.  Diverticulosis.These are pouches that form on the colon over time, with age, and may bleed significantly.  Diverticulitis.This is inflammation in areas with diverticulosis. It can cause pain, fever, and bloody stools, although bleeding is rare.  Polyps and cancer. Colon cancer often starts out as precancerous polyps.  Gastritis and ulcers.Bleeding from the upper gastrointestinal tract (near the stomach) may travel through the intestines and produce black, sometimes tarry, often bad smelling stools. In certain cases, if the bleeding is fast enough, the stools may not be black, but red. This condition may be life-threatening. SYMPTOMS   Vomiting bright red blood or material that looks like coffee grounds.  Bloody, black, or tarry stools. DIAGNOSIS  Your caregiver may diagnose your condition by taking your history and performing a physical exam. More tests may be needed, including:  X-rays and other imaging tests.  Esophagogastroduodenoscopy (EGD). This test uses a flexible, lighted tube to look at your esophagus, stomach, and small intestine.  Colonoscopy. This test uses a flexible, lighted tube to look at your colon. TREATMENT  Treatment depends on the cause of your bleeding.   For bleeding from the esophagus, stomach, small intestine, or colon, the caregiver doing your EGD or colonoscopy may be able to stop the bleeding as part of  the procedure.  Inflammation or infection of the colon can be treated with medicines.  Many rectal problems can be treated with creams, suppositories, or warm baths.  Surgery is sometimes needed.  Blood transfusions are sometimes needed if you have lost a lot of blood. If bleeding is slow, you may be allowed to go home. If there is a lot of bleeding, you will need to stay in the hospital for observation. HOME CARE INSTRUCTIONS   Take any medicines exactly as prescribed.  Keep your stools soft by eating foods that are high in fiber. These foods include whole grains, legumes, fruits, and vegetables. Prunes (1 to 3 a day) work well for many people.  Drink enough fluids to keep your urine clear or pale yellow. SEEK IMMEDIATE MEDICAL CARE IF:   Your bleeding increases.  You feel lightheaded, weak, or you faint.  You have severe cramps in your back or abdomen.  You pass large blood clots in your stool.  Your problems are getting worse. MAKE SURE YOU:   Understand these instructions.  Will watch your condition.  Will get help right away if you are not doing well or get worse. Document Released: 10/02/2000 Document Revised: 09/21/2012 Document Reviewed: 09/14/2011 [facility] Patient Information 2015 [place], [place]. This information is not intended to replace advice given to you by your health care provider. Make sure you discuss any questions you have with your health care provider.  Anemia, Nonspecific Anemia is a condition in which the concentration of red blood cells or hemoglobin in the blood is below normal. Hemoglobin is a substance in red blood cells that carries oxygen to the tissues of the body. Anemia results in not enough oxygen reaching these tissues.  CAUSES  Common causes of anemia include:   Excessive bleeding. Bleeding may be internal or external. This includes excessive bleeding from periods (in women) or from the intestine.   Poor nutrition.   Chronic kidney,  thyroid, and liver disease.  Bone marrow disorders that decrease red blood cell production.  Cancer and treatments for cancer.  HIV, AIDS, and their treatments.  Spleen problems that increase red blood cell destruction.  Blood disorders.  Excess destruction of red blood cells due to infection, medicines, and autoimmune disorders. SIGNS AND SYMPTOMS   Minor weakness.   Dizziness.   Headache.  Palpitations.   Shortness of breath, especially with exercise.   Paleness.  Cold sensitivity.  Indigestion.  Nausea.  Difficulty sleeping.  Difficulty concentrating. Symptoms may occur suddenly or they may develop slowly.  DIAGNOSIS  Additional blood tests are often needed. These help your health care provider determine the best treatment. Your health care provider will check your stool for blood and look for other causes of blood loss.  TREATMENT  Treatment varies depending on the cause of the anemia. Treatment can include:   Supplements of iron, vitamin Z61, or folic acid.   Hormone medicines.   A blood transfusion. This may be needed if blood loss is severe.   Hospitalization. This may be needed if there is significant continual blood loss.   Dietary changes.  Spleen removal. HOME CARE INSTRUCTIONS Keep all follow-up appointments. It often takes many weeks to correct anemia, and having your health care provider check on your condition and your response to treatment is very important. SEEK IMMEDIATE MEDICAL CARE IF:   You develop extreme weakness, shortness of breath, or chest pain.   You become dizzy or have trouble concentrating.  You develop heavy vaginal bleeding.   You develop a rash.   You have bloody or black, tarry stools.   You faint.   You vomit up blood.   You vomit repeatedly.   You have abdominal pain.  You have a fever or persistent symptoms for more than 2-3 days.   You have a fever and your symptoms suddenly get worse.    You are dehydrated.  MAKE SURE YOU:  Understand these instructions.  Will watch your condition.  Will get help right away if you are not doing well or get worse. Document Released: 11/12/2004 Document Revised: 06/07/2013 Document Reviewed: 03/31/2013 [facility] Patient Information 2015 [place], [place]. This information is not intended to replace advice given to you by your health care provider. Make sure you discuss any questions you have with your health care provider.

## 2015-03-21 NOTE — Care Management Note (Signed)
Case Management Note  Patient Details  Name: Dawn [name] MRN: [id number] Date of Birth: 1951-10-14  Subjective/Objective:      Patient lives with spouse, patient chose [facility] for HHPT, referral made to [facility], she states they will begin soc next week , patient states this is ok for her for them to start next week.                Action/Plan:   Expected Discharge Date:  03/22/15               Expected Discharge Plan:  [place]  In-House Referral:     Discharge planning Services  CM Consult  Post Acute Care Choice:  Home Health Choice offered to:  Patient  DME Arranged:    DME Agency:     HH Arranged:  PT HH Agency:  [facility]  Status of Service:  Completed, signed off  Medicare Important Message Given:  No Date Medicare IM Given:    Medicare IM give by:    Date Additional Medicare IM Given:    Additional Medicare Important Message give by:     If discussed at [place] of Stay Meetings, dates discussed:    Additional Comments:  Zenon Mayo, RN 03/21/2015, 2:36 PM

## 2015-03-21 NOTE — Discharge Summary (Signed)
Physician Discharge Summary  Dawn [name] GXQ:[id number] DOB: 08-24-1951 DOA: 03/19/2015  PCP: Dawn Saner., MD  Admit date: 03/19/2015 Discharge date: 03/21/2015  Time spent: Greater than 30 minutes  Recommendations for Outpatient Follow-up:  1. Dr. Willey [name], PCP in 1 week with repeat labs (CBC & BMP). Please follow results of H Pylori sent from the hospital and address as needed. 2. Dr. Delfin [name], GI on 05/03/2015 3. Home Health PT.  Discharge Diagnoses:  Principal Problem:   Acute GI bleeding Active Problems:   Insulin dependent type 2 diabetes mellitus, uncontrolled   Hypertension   History of CVA (cerebrovascular accident)   Dyslipidemia   Metabolic encephalopathy   Hypotension   Symptomatic anemia   Acute renal failure   Hypokalemia   CT head c/w Normal pressure hydrocephalus   GI bleed   Acute posthemorrhagic anemia   Discharge Condition: Improved & Stable  Diet recommendation: Heart Healthy & Diabetic diet.  Filed Weights   03/19/15 1640 03/20/15 0500 03/21/15 0502  Weight: 75.1 kg (165 lb 9.1 oz) 74.1 kg (163 lb 5.8 oz) 74.2 kg (163 lb 9.3 oz)    History of present illness:  64 year old female with prior history of CVA, diabetes mellitus, hypertension, dyslipidemia and GERD, presents with bright red blood per rectum, lethargy, in ED workup was significant for hypotension, blood pressure 70/42, anemia with hemoglobin of 6.2, patient denies any NSAID use, only on aspirin, husband reports of vomiting, but no coffee-ground emesis, s/p transfused 2 units packed red blood cell, on Protonix drip. S/P EGD> non bleeding duodenal bulb ulcer> hemo clipped.  Hospital Course:   Principal Problem:  Acute GI bleeding - Admitted to stepdown unit for close monitoring. GI was consulted & patient underwent EGD which showed large but nonbleeding duodenal bulb ulcer which was hemoclipped. - She was on protonix infusion which was switched to oral on 6/1 - As per GI  follow-up today, if H pylori is positive, add triple therapy - Patient has never had a colonoscopy and may need to consider this as outpatient - GI signed off 6/1 - Discussed with Ms Dawn [name], GI PA who advised holding ASA for additional week and resume if no bleeding.  Active Problems:  Metabolic encephalopathy - suspect underlying mild congniitive impairment magnified by hypotension/symptomatic anemia noting pt mentation has improved with restoration of BP - Mental status changes have resolved   Hypotension - Likely secondary to acute blood loss - Resolved after blood transfusions  Acute blood loss anemia - Secondary to upper GI bleed - Status post 2 units of PRBCs. Hemoglobin has improved from 6.2-8.9. - Stable - Consider Iron supplements as OP.   Insulin dependent type 2 diabetes mellitus, uncontrolled - hold Metformin acutely - Patient on reduced dose of Levemir. CBGs reasonably controlled - DC on prior home meds   Hypertension - resent hypotension (see above) - Meds were temporarily held. Resume Benicar HCT and reassess OP regarding adding back other meds.   Acute renal failure - 2/2 to poor perfusion in setting of hypotension and active GIB - Resolved   History of CVA - PT recommended HH PT - no reported issues concerning for dysphagia - Resume ASA in a week - CT head neg for acute findings. Chronic Ventriculomegaly noted> may consider OP Neurology consultation re evaluation of Normal pressure Hydrocephalus- seems less likely clinically   Dyslipidemia - continue statins   Hypokalemia - Improved  Consultations:  [facility]  Procedures:  EGD 03/19/2015: ENDOSCOPIC IMPRESSION: 1. The mucosa of  the esophagus appeared normal 2. 2 cm hiatal hernia 3. Single non-bleeding ulcer duodenal bulb, measuring 20 x 31mm in size, was found in the duodenal bulb; To prevent future bleeding one hemoclips was placed on the site(s)  RECOMMENDATIONS: continue  to observe for further bleeding Continue PPI IV sips of clear liquids hold ASA and NSAID's will need oral Iron supplements  check for H.Pylori antibody.    Discharge Exam:  Complaints: Denied complaints and anxious to go home. No BM's since EGD.  Filed Vitals:   03/20/15 1800 03/20/15 2007 03/21/15 0502 03/21/15 1522  BP: 111/67 113/68 124/74 143/73  Pulse: 92 99 91 117  Temp:  98.5 F (36.9 C) 98.5 F (36.9 C) 98.6 F (37 C)  TempSrc:  Oral Oral Oral  Resp: 15 18  16   Height:      Weight:   74.2 kg (163 lb 9.3 oz)   SpO2: 97% 97% 96% 100%    General exam: Pleasant middle-aged female lying comfortably in bed. Respiratory system: Clear. No increased work of breathing. Cardiovascular system: S1 & S2 heard, RRR. No JVD, murmurs, gallops, clicks or pedal edema.  Gastrointestinal system: Abdomen is nondistended, soft and nontender. Normal bowel sounds heard. Central nervous system: Alert and oriented. No focal neurological deficits. Extremities: Symmetric 5 x 5 power.  Discharge Instructions      Discharge Instructions    Call MD for:  difficulty breathing, headache or visual disturbances    Complete by:  As directed      Call MD for:  extreme fatigue    Complete by:  As directed      Call MD for:  persistant dizziness or light-headedness    Complete by:  As directed      Call MD for:  persistant nausea and vomiting    Complete by:  As directed      Call MD for:  severe uncontrolled pain    Complete by:  As directed      Call MD for:    Complete by:  As directed   Blood in stools or black stools.     Diet - low sodium heart healthy    Complete by:  As directed      Diet Carb Modified    Complete by:  As directed      Increase activity slowly    Complete by:  As directed             Medication List    STOP taking these medications        amLODipine 10 MG tablet  Commonly known as:  NORVASC     hydrALAZINE 25 MG tablet  Commonly known as:  APRESOLINE      OVER THE COUNTER MEDICATION      TAKE these medications        aspirin 81 MG chewable tablet  Chew 1 tablet (81 mg total) by mouth daily. DO NOT TAKE FOR 1 WEEK. May resume on 03/29/2015.     BENICAR HCT 20-12.5 MG per tablet  Generic drug:  olmesartan-hydrochlorothiazide  Take 1 tablet by mouth daily.     gabapentin 100 MG capsule  Commonly known as:  NEURONTIN  Take 1 capsule (100 mg total) by mouth 3 (three) times daily.     insulin detemir 100 UNIT/ML injection  Commonly known as:  LEVEMIR  Inject 20-28 Units into the skin 2 (two) times daily. Patient takes 28 units of Levemir in the morning and 20 in the  evening     metFORMIN 1000 MG tablet  Commonly known as:  GLUCOPHAGE  Take 1,000 mg by mouth 2 (two) times daily with a meal.     multivitamin with minerals tablet  Take 1 tablet by mouth daily.     pantoprazole 40 MG tablet  Commonly known as:  PROTONIX  Take 1 tablet (40 mg total) by mouth daily.     simvastatin 20 MG tablet  Commonly known as:  ZOCOR  Take 1 tablet (20 mg total) by mouth daily at 6 PM.       Follow-up Information    Follow up with Dawn Edis, MD On 05/03/2015.   Specialty:  Gastroenterology   Why:  2:15 PM for ulcer follow up.    Contact information:   [address] [place] [address] [phone]       Follow up with Dawn Saner., MD. Schedule an appointment as soon as possible for a visit in 1 week.   Specialty:  Internal Medicine   Why:  To be seen with repeat labs (CBC & BMP).   Contact information:   [address] [place] [address] [phone]       Follow up with [facility]   Specialty:  Home Health Services   Why:  HHPT   Contact information:   [address] [place] [address] [phone]        The results of significant diagnostics from this hospitalization (including imaging, microbiology, ancillary and laboratory) are listed below for reference.     Significant Diagnostic Studies: Ct Head Wo Contrast  03/19/2015   CLINICAL DATA:  Confusion, rectal bleeding, diabetes, hypertension, stroke, hyperlipidemia, former smoker  EXAM: CT HEAD WITHOUT CONTRAST  TECHNIQUE: Contiguous axial images were obtained from the base of the skull through the vertex without intravenous contrast.  COMPARISON:  09/07/2013  FINDINGS: Chronic dilatation of the ventricular system.  No midline shift or mass effect.  Small vessel chronic ischemic changes of deep cerebral white matter.  Probable old small deep infarct LEFT cerebellum.  No intracranial hemorrhage, mass lesion or additional evidence of acute infarction.  No extra-axial fluid collections.  Atherosclerotic calcification of internal carotid and vertebral arteries at skullbase.  Bones and sinuses unremarkable.  IMPRESSION: Small vessel chronic ischemic changes of deep cerebral white matter.  Chronic ventriculomegaly question normal pressure hydrocephalus.  Probable small old lacunar infarct in deep LEFT cerebellar hemisphere.  No acute intracranial abnormalities.   Electronically Signed   By: Lavonia Dana M.D.   On: 03/19/2015 12:32    Microbiology: Recent Results (from the past 240 hour(s))  MRSA PCR Screening     Status: None   Collection Time: 03/19/15  4:56 PM  Result Value Ref Range Status   MRSA by PCR NEGATIVE NEGATIVE Final    Comment:        The GeneXpert MRSA Assay (FDA approved for NASAL specimens only), is one component of a comprehensive MRSA colonization surveillance program. It is not intended to diagnose MRSA infection nor to guide or monitor treatment for MRSA infections.      Labs: Basic Metabolic Panel:  Recent Labs Lab 03/19/15 1038 03/20/15 0314  NA 139 140  K 3.3* 3.6  CL 94* 103  CO2 29 27  GLUCOSE 287* 113*  BUN 101* 47*  CREATININE 1.72* 0.90  CALCIUM 8.9 8.4*   Liver Function Tests:  Recent Labs Lab 03/19/15 1038 03/20/15 0314  AST 20 19  ALT  12* 12*   ALKPHOS 24* 28*  BILITOT 0.5 1.1  PROT 5.5* 6.4*  ALBUMIN 3.1* 3.3*   No results for input(s): LIPASE, AMYLASE in the last 168 hours. No results for input(s): AMMONIA in the last 168 hours. CBC:  Recent Labs Lab 03/19/15 1038 03/20/15 0314 03/21/15 0720  WBC 13.4* 11.8* 11.4*  HGB 6.2* 8.9* 8.6*  HCT 18.9* 26.4* 25.1*  MCV 80.8 81.2 81.2  PLT 234 192 191   Cardiac Enzymes: No results for input(s): CKTOTAL, CKMB, CKMBINDEX, TROPONINI in the last 168 hours. BNP: BNP (last 3 results) No results for input(s): BNP in the last 8760 hours.  ProBNP (last 3 results) No results for input(s): PROBNP in the last 8760 hours.  CBG:  Recent Labs Lab 03/20/15 1210 03/20/15 1621 03/20/15 2233 03/21/15 0752 03/21/15 1145  GLUCAP 111* 156* 297* 102* 202*       Signed:  Vernell Leep, MD, FACP, FHM. [facility] Pager [phone]  If 7PM-7AM, please contact night-coverage [email or web address] Password [email or web address] 03/21/2015, 6:10 PM

## 2015-03-21 NOTE — Evaluation (Signed)
Physical Therapy Evaluation Patient Details Name: Dawn [name] MRN: [id number] DOB: 03-Sep-1951 Today's Date: 03/21/2015   History of Present Illness  Pt adm with upper GI bleed. PMH - CVA, DM  Clinical Impression  Pt close to baseline for mobility. Feel she could benefit from HHPT to work on balance, speed with gait, and increasing endurance. Ready for DC home from PT standpoint. Recommended to pt that she use her walker at DC.    Follow Up Recommendations Home health PT (and aide)    Equipment Recommendations  None recommended by PT    Recommendations for Other Services       Precautions / Restrictions Precautions Precautions: Fall Restrictions Weight Bearing Restrictions: No      Mobility  Bed Mobility Overal bed mobility: Modified Independent             General bed mobility comments: Incr time  Transfers Overall transfer level: Modified independent Equipment used: None             General transfer comment: Incr time  Ambulation/Gait Ambulation/Gait assistance: Modified independent (Device/Increase time) Ambulation Distance (Feet): 150 Feet Assistive device: Rolling walker (2 wheeled) Gait Pattern/deviations: Step-through pattern;Decreased step length - right;Decreased step length - left Gait velocity: very slow Gait velocity interpretation: <1.8 ft/sec, indicative of risk for recurrent falls General Gait Details: Very slow speed.  Stairs            Wheelchair Mobility    Modified Rankin (Stroke Patients Only)       Balance Overall balance assessment: Needs assistance Sitting-balance support: No upper extremity supported;Feet supported Sitting balance-Leahy Scale: Good     Standing balance support: No upper extremity supported Standing balance-Leahy Scale: Fair Standing balance comment: Needed walker for dynamic                             Pertinent Vitals/Pain Pain Assessment: No/denies pain    Home Living  Family/patient expects to be discharged to:: Private residence Living Arrangements: Spouse/significant other Available Help at Discharge: Family;Available PRN/intermittently Type of Home: House Home Access: Stairs to enter Entrance Stairs-Rails: None Entrance Stairs-Number of Steps: 2 Home Layout: Two level Home Equipment: Walker - 2 wheels;Walker - 4 wheels;Cane - single point;Shower seat      Prior Function Level of Independence: Needs assistance   Gait / Transfers Assistance Needed: Amb independently. Uses walker at times.  ADL's / Homemaking Assistance Needed: Husband helps if she gets in the bathtub        Hand Dominance   Dominant Hand: Right    Extremity/Trunk Assessment   Upper Extremity Assessment: Overall WFL for tasks assessed           Lower Extremity Assessment: Generalized weakness         Communication   Communication: No difficulties  Cognition Arousal/Alertness: Awake/alert Behavior During Therapy: WFL for tasks assessed/performed Overall Cognitive Status: No family/caregiver present to determine baseline cognitive functioning Area of Impairment: Problem solving             Problem Solving: Slow processing      General Comments      Exercises        Assessment/Plan    PT Assessment All further PT needs can be met in the next venue of care  PT Diagnosis Generalized weakness;Difficulty walking   PT Problem List Decreased strength;Decreased activity tolerance;Decreased balance;Decreased mobility  PT Treatment Interventions     PT Goals (Current goals can  be found in the Care Plan section) Acute Rehab PT Goals PT Goal Formulation: All assessment and education complete, DC therapy    Frequency     Barriers to discharge        Co-evaluation               End of Session Equipment Utilized During Treatment: Gait belt Activity Tolerance: Patient tolerated treatment well Patient left: in bed;with call bell/phone within  reach;with bed alarm set (sitting EOB) Nurse Communication: Mobility status         Time: 4730-8569 PT Time Calculation (min) (ACUTE ONLY): 14 min   Charges:   PT Evaluation $Initial PT Evaluation Tier I: 1 Procedure     PT G Codes:        Dawn [name] Mar 26, 2015, 11:23 AM  [name] PT [phone]

## 2015-04-01 ENCOUNTER — Other Ambulatory Visit: Payer: Self-pay | Admitting: *Deleted

## 2015-04-01 ENCOUNTER — Telehealth: Payer: Self-pay | Admitting: *Deleted

## 2015-04-01 DIAGNOSIS — K922 Gastrointestinal hemorrhage, unspecified: Secondary | ICD-10-CM

## 2015-04-01 NOTE — Telephone Encounter (Signed)
-----   Message from Lafayette Dragon, MD sent at 03/28/2015  4:38 PM EDT ----- Rollene Fare, according to discharge summary, she was put on triple H.Pylori therapy before discharge. She will need CBC when I see her. ----- Message -----    From: Hulan Saas, RN    Sent: 03/28/2015   9:58 AM      To: Lafayette Dragon, MD  Dr. Olevia Perches, Does this patient need anything prior to her OV on 05/03/15? (I see that her H. Pylori antibody was abnormal.) Rollene Fare

## 2015-04-01 NOTE — Telephone Encounter (Signed)
CBC in EPIC. Left a message for patient to call back.

## 2015-04-03 NOTE — Telephone Encounter (Signed)
Left a message for patient to call back. 

## 2015-04-04 NOTE — Telephone Encounter (Signed)
Left a message for patient to call back. 

## 2015-04-10 NOTE — Telephone Encounter (Signed)
Left a message for patient to call back. 

## 2015-04-10 NOTE — Telephone Encounter (Signed)
-----   Message from Lafayette Dragon, MD sent at 04/09/2015  5:15 PM EDT ----- Zauria Dombek,please, start  Bioxin 500 mg po bid, #20 ,Metronidazole 250 mg po qid  #40, and PPI bid. X 10 days. OV follow up first available.Rollene Fare, please, ----- Message -----    From: Hulan Saas, RN    Sent: 04/09/2015   1:36 PM      To: Lafayette Dragon, MD  Dr. Olevia Perches, What would you like this patient to be on? Chirsty Armistead ----- Message -----    From: Modena Jansky, MD    Sent: 04/07/2015   5:03 PM      To: Lafayette Dragon, MD, Hulan Saas, RN  Hi Dr. Olevia Perches, The patient was not DC'ed on triple therapy as her H. Pylori results were not back. I see her Iga is positive. Please address as needed.  Thank you Everlene Farrier

## 2015-04-11 NOTE — Telephone Encounter (Signed)
Left a message for patient to call for results.

## 2015-04-12 NOTE — Telephone Encounter (Signed)
Left a message for patient to call for results.

## 2015-04-12 NOTE — Telephone Encounter (Signed)
Left a message for patient to call back. 

## 2015-04-15 ENCOUNTER — Encounter: Payer: Self-pay | Admitting: *Deleted

## 2015-04-15 ENCOUNTER — Other Ambulatory Visit: Payer: Self-pay | Admitting: *Deleted

## 2015-04-15 MED ORDER — PANTOPRAZOLE SODIUM 40 MG PO TBEC: DELAYED_RELEASE_TABLET | ORAL | Status: DC

## 2015-04-15 MED ORDER — CLARITHROMYCIN 500 MG PO TABS: ORAL_TABLET | ORAL | Status: DC

## 2015-04-15 MED ORDER — METRONIDAZOLE 250 MG PO TABS: ORAL_TABLET | ORAL | Status: DC

## 2015-04-15 NOTE — Telephone Encounter (Signed)
Patient has not returned phone calls. Letter mailed to patient requesting she call us for results and recommendations.

## 2015-04-15 NOTE — Telephone Encounter (Signed)
Spoke with patient and gave her recommendations. Patient will hold Zocor while on antibiotics.(severe interaction with antibiotics)

## 2015-04-16 ENCOUNTER — Other Ambulatory Visit: Payer: Self-pay | Admitting: *Deleted

## 2015-05-03 ENCOUNTER — Encounter: Payer: Self-pay | Admitting: Internal Medicine

## 2015-05-03 ENCOUNTER — Ambulatory Visit (INDEPENDENT_AMBULATORY_CARE_PROVIDER_SITE_OTHER): Payer: Managed Care, Other (non HMO) | Admitting: Internal Medicine

## 2015-05-03 VITALS — BP 126/76 | HR 92 | Ht 61.0 in | Wt 167.2 lb

## 2015-05-03 DIAGNOSIS — K264 Chronic or unspecified duodenal ulcer with hemorrhage: Secondary | ICD-10-CM | POA: Diagnosis not present

## 2015-05-03 DIAGNOSIS — D649 Anemia, unspecified: Secondary | ICD-10-CM

## 2015-05-03 DIAGNOSIS — R195 Other fecal abnormalities: Secondary | ICD-10-CM | POA: Diagnosis not present

## 2015-05-03 MED ORDER — NA SULFATE-K SULFATE-MG SULF 17.5-3.13-1.6 GM/177ML PO SOLN: ORAL | Status: DC

## 2015-05-03 NOTE — Patient Instructions (Addendum)
DR Heath Gold  You have been scheduled for a colonoscopy. Please follow written instructions given to you at your visit today.  Please pick up your prep supplies at the pharmacy within the next 1-3 days. If you use inhalers (even only as needed), please bring them with you on the day of your procedure. Your physician has requested that you go to www.startemmi.com and enter the access code given to you at your visit today. This web site gives a general overview about your procedure. However, you should still follow specific instructions given to you by our office regarding your preparation for the procedure.   We have sent medications to your pharmacy for you to pick up at your convenience.

## 2015-05-03 NOTE — Progress Notes (Signed)
Dawn [name] June 24, 1951 [id number]  Note: This dictation was prepared with Dragon digital system. Any transcriptional errors that result from this procedure are unintentional.   History of Present Illness: This is a Dawn [name] hospitalized from 5//3/ 2 016 to 6 /11/2014  with acute upper GI bleed. She was initially hypotensive 70/40 . Hemoglobin was  6.2. She was transfused and stabilized. endoscoped with findings of a duodenal ulcer which was H. pylori positive. She took the H. pylori treatment. The asymptomatic feeling better. She had a stroke in the past and has limited mobility. She is a diabetic and has had high blood pressure. She lives at home with her husband. Patient ambulates with a cane     Past Medical History  Diagnosis Date  . Diabetes mellitus without complication   . Hypertension   . Stroke 01/11/13    right corona radiata infarct  . Altered mental status 09/08/13    taken to [facility] ER  . 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 SVD (spontaneous vaginal delivery)     x 3  . Hyperlipidemia   . Shortness of breath     Hx in past 25 yrs ago  . GERD (gastroesophageal reflux disease)     Past Surgical History  Procedure Laterality Date  . Tubal ligation    . Dilatation & currettage/hysteroscopy with resectocope N/A 02/07/2014    Procedure: EXAM UNDER ANESTHESIA WITH BIOPSY, DILATATION & CURETTAGE/HYSTEROSCOPY;  Surgeon: Delice Lesch, MD;  Location: [facility];  Service: Gynecology;  Laterality: N/A;  . Esophagogastroduodenoscopy N/A 03/19/2015    Procedure: ESOPHAGOGASTRODUODENOSCOPY (EGD);  Surgeon: Lafayette Dragon, MD;  Location: [facility] ENDOSCOPY;  Service: Endoscopy;  Laterality: N/A;    Allergies  Allergen Reactions  . Penicillins Swelling    Family history and social history have been reviewed.  Review of Systems: Denies abdominal pain. No nausea. No dark stools  The  remainder of the 10 point ROS is negative except as outlined in the H&P  Physical Exam: General Appearance Well developed, in no distress, crease affect Eyes  Non icteric  HEENT  Non traumatic, normocephalic  Mouth No lesion, tongue papillated, no cheilosis edentulous Neck Supple without adenopathy, thyroid not enlarged, no carotid bruits, no JVD Lungs Clear to auscultation bilaterally COR Normal S1, normal S2, regular rhythm, no murmur, quiet precordium Abdomen protuberant but soft nontender with normoactive bowel sounds. Rectal load trace Hemoccult-positive stool Extremities  1+ pedal edema Skin No lesions Neurological Alert and oriented x 3, sluggish. Answers all questions Psychological Normal mood ,flat affect  Assessment and Plan:   Dawn [name] post-upper GI bleed due to  H. pylori positive duodenal ulcer. Status post treatment. With triple therapy. She is trace Hemoccult-positive today. We will be rechecking her CBC and iron studies. Continue Protonix 40 mg daily.   Colorectal screening. Heme positive stool. Patient never had a colonoscopy. We will schedule for colonoscopy with the routine prep I have discussed prep with the husband who feels that the patient will be able to prep at home      Dawn [name] 05/03/2015

## 2015-06-07 ENCOUNTER — Encounter: Payer: Managed Care, Other (non HMO) | Admitting: Internal Medicine

## 2015-07-11 ENCOUNTER — Ambulatory Visit (AMBULATORY_SURGERY_CENTER): Payer: Managed Care, Other (non HMO) | Admitting: Gastroenterology

## 2015-07-11 ENCOUNTER — Encounter: Payer: Self-pay | Admitting: Gastroenterology

## 2015-07-11 VITALS — BP 137/77 | HR 93 | Temp 97.5°F | Resp 21 | Ht 61.0 in | Wt 167.0 lb

## 2015-07-11 DIAGNOSIS — D125 Benign neoplasm of sigmoid colon: Secondary | ICD-10-CM

## 2015-07-11 DIAGNOSIS — Z1211 Encounter for screening for malignant neoplasm of colon: Secondary | ICD-10-CM

## 2015-07-11 DIAGNOSIS — R195 Other fecal abnormalities: Secondary | ICD-10-CM

## 2015-07-11 HISTORY — PX: COLONOSCOPY: SHX174

## 2015-07-11 LAB — GLUCOSE, CAPILLARY
GLUCOSE-CAPILLARY: 175 mg/dL — AB (ref 65–99)
GLUCOSE-CAPILLARY: 161 mg/dL — AB (ref 65–99)

## 2015-07-11 MED ORDER — SODIUM CHLORIDE 0.9 % IV SOLN: 500.0000 mL | INTRAVENOUS | Status: DC

## 2015-07-11 NOTE — Progress Notes (Signed)
Report to PACU, RN, vss, BBS= Clear.  

## 2015-07-11 NOTE — Patient Instructions (Signed)
Discharge instructions given. Handout on polyps. Resume previous medications. YOU HAD AN ENDOSCOPIC PROCEDURE TODAY AT THE [facility]:   Refer to the procedure report that was given to you for any specific questions about what was found during the examination.  If the procedure report does not answer your questions, please call your gastroenterologist to clarify.  If you requested that your care partner not be given the details of your procedure findings, then the procedure report has been included in a sealed envelope for you to review at your convenience later.  YOU SHOULD EXPECT: Some feelings of bloating in the abdomen. Passage of more gas than usual.  Walking can help get rid of the air that was put into your GI tract during the procedure and reduce the bloating. If you had a lower endoscopy (such as a colonoscopy or flexible sigmoidoscopy) you may notice spotting of blood in your stool or on the toilet paper. If you underwent a bowel prep for your procedure, you may not have a normal bowel movement for a few days.  Please Note:  You might notice some irritation and congestion in your nose or some drainage.  This is from the oxygen used during your procedure.  There is no need for concern and it should clear up in a day or so.  SYMPTOMS TO REPORT IMMEDIATELY:   Following lower endoscopy (colonoscopy or flexible sigmoidoscopy):  Excessive amounts of blood in the stool  Significant tenderness or worsening of abdominal pains  Swelling of the abdomen that is new, acute  Fever of 100F or higher   For urgent or emergent issues, a gastroenterologist can be reached at any hour by calling [phone].   DIET: Your first meal following the procedure should be a small meal and then it is ok to progress to your normal diet. Heavy or fried foods are harder to digest and may make you feel nauseous or bloated.  Likewise, meals heavy in dairy and vegetables can increase bloating.  Drink  plenty of fluids but you should avoid alcoholic beverages for 24 hours.  ACTIVITY:  You should plan to take it easy for the rest of today and you should NOT DRIVE or use heavy machinery until tomorrow (because of the sedation medicines used during the test).    FOLLOW UP: Our staff will call the number listed on your records the next business day following your procedure to check on you and address any questions or concerns that you may have regarding the information given to you following your procedure. If we do not reach you, we will leave a message.  However, if you are feeling well and you are not experiencing any problems, there is no need to return our call.  We will assume that you have returned to your regular daily activities without incident.  If any biopsies were taken you will be contacted by phone or by letter within the next 1-3 weeks.  Please call us at [phone] if you have not heard about the biopsies in 3 weeks.    SIGNATURES/CONFIDENTIALITY: You and/or your care partner have signed paperwork which will be entered into your electronic medical record.  These signatures attest to the fact that that the information above on your After Visit Summary has been reviewed and is understood.  Full responsibility of the confidentiality of this discharge information lies with you and/or your care-partner. 

## 2015-07-11 NOTE — Op Note (Signed)
[place]  [address] [place], [id number]   COLONOSCOPY PROCEDURE REPORT  PATIENT: Dawn [name], Dawn [name]  MR#: [id number] BIRTHDATE: [birth year] , 20  yrs. old GENDER: female ENDOSCOPIST: Lagro Cellar, MD REFERRED BY: PROCEDURE DATE:  07/11/2015 PROCEDURE:   Colonoscopy with snare polypectomy First Screening Colonoscopy - Avg.  risk and is 50 yrs.  old or older Yes.  Prior Negative Screening - Now for repeat screening. N/A  History of Adenoma - Now for follow-up colonoscopy & has been > or = to 3 yrs.  N/A  Polyps removed today? Yes ASA CLASS:   Class III INDICATIONS:Screening for colonic neoplasia and Colorectal Neoplasm Risk Assessment for this procedure is average risk. MEDICATIONS: Propofol 200 mg IV  DESCRIPTION OF PROCEDURE:   After the risks benefits and alternatives of the procedure were thoroughly explained, informed consent was obtained.  The digital rectal exam revealed no abnormalities of the rectum.   The LB PFC-H190 [id number]  endoscope was introduced through the anus and advanced to the cecum, which was identified by both the appendix and ileocecal valve. No adverse events experienced.   The quality of the prep was fair.  The instrument was then slowly withdrawn as the colon was fully examined. Estimated blood loss is zero unless otherwise noted in this procedure report.  COLON FINDINGS: The bowel prep was fair throughout the colon, and poor in the right colon/cecum.  No large polyps or mass lesions noted in the right colon however small or flat polyps may not have been appreciated in this region of the colon given the prep.  Time was taken to lavage the colon, the right colon would not completely clear.   A sessile polyp measuring 4 mm in size was found in the sigmoid colon.  A polypectomy was performed with a cold snare.  The resection was complete, the polyp tissue was completely retrieved and sent to histology.   The examination was otherwise  normal. Retroflexed views revealed no abnormalities. The time to cecum = 3.2 Withdrawal time = 19.6   The scope was withdrawn and the procedure completed. COMPLICATIONS: There were no immediate complications.  ENDOSCOPIC IMPRESSION: Sessile polyp was found in the sigmoid colon; polypectomy was performed with a cold snare Fair prep overall, poor prep in the right colon. Time taken to lavage, right colon would not completely clear however no large polyps or mass lesions noted  RECOMMENDATIONS: Resume diet Resume medications Await pathology results Consider repeat colonoscopy within one year given bowel preparation noted on this exam, to ensure no missed small or flat polyps eSigned:  Appleby Cellar, MD 07/11/2015 10:52 AM  cc: the patient

## 2015-07-11 NOTE — Progress Notes (Signed)
Called to room to assist during endoscopic procedure.  Patient ID and intended procedure confirmed with present staff. Received instructions for my participation in the procedure from the performing physician.  

## 2015-07-12 ENCOUNTER — Telehealth: Payer: Self-pay | Admitting: *Deleted

## 2015-07-12 NOTE — Telephone Encounter (Signed)
No answer, unable to leave message related mailbox is full.

## 2015-07-16 ENCOUNTER — Encounter: Payer: Self-pay | Admitting: Gastroenterology

## 2015-07-22 ENCOUNTER — Telehealth: Payer: Self-pay | Admitting: *Deleted

## 2015-07-22 NOTE — Telephone Encounter (Signed)
Spoke with Dr Havery Moros 10-3 about this patient's prep. Her prep at her 07-11-15 colon was poor. He instructed today to do a 2 day miralax/ suprep prep for this patient.  Lelan Pons PV

## 2015-07-24 ENCOUNTER — Ambulatory Visit (AMBULATORY_SURGERY_CENTER): Payer: Self-pay | Admitting: *Deleted

## 2015-07-24 VITALS — Ht 61.0 in | Wt 175.6 lb

## 2015-07-24 DIAGNOSIS — R195 Other fecal abnormalities: Secondary | ICD-10-CM

## 2015-07-24 DIAGNOSIS — Z86010 Personal history of colonic polyps: Secondary | ICD-10-CM

## 2015-07-24 MED ORDER — NA SULFATE-K SULFATE-MG SULF 17.5-3.13-1.6 GM/177ML PO SOLN: 1.0000 | Freq: Once | ORAL | Status: DC

## 2015-07-24 NOTE — Progress Notes (Signed)
No egg or soy  Allergy No issues with past sedation No diet pills No home 02 Had colon 07-11-15 with poor prep. Did have TA polyps and since prep poor Dr Havery Moros wanted another colon, this time with a 2 day prep. See TE  Husband states her heart rate is normally elevated 90-110. This is typical for her . Also her BP can be elevated as well even with her meds

## 2015-08-02 ENCOUNTER — Ambulatory Visit (AMBULATORY_SURGERY_CENTER): Payer: Managed Care, Other (non HMO) | Admitting: Gastroenterology

## 2015-08-02 ENCOUNTER — Encounter: Payer: Self-pay | Admitting: Gastroenterology

## 2015-08-02 VITALS — BP 139/88 | HR 90 | Temp 97.8°F | Resp 28 | Ht 61.0 in | Wt 175.0 lb

## 2015-08-02 DIAGNOSIS — Z86010 Personal history of colonic polyps: Secondary | ICD-10-CM

## 2015-08-02 DIAGNOSIS — K621 Rectal polyp: Secondary | ICD-10-CM

## 2015-08-02 DIAGNOSIS — D128 Benign neoplasm of rectum: Secondary | ICD-10-CM

## 2015-08-02 LAB — GLUCOSE, CAPILLARY
GLUCOSE-CAPILLARY: 169 mg/dL — AB (ref 65–99)
GLUCOSE-CAPILLARY: 187 mg/dL — AB (ref 65–99)

## 2015-08-02 MED ORDER — SODIUM CHLORIDE 0.9 % IV SOLN: 500.0000 mL | INTRAVENOUS | Status: DC

## 2015-08-02 NOTE — Progress Notes (Deleted)
Dental advisory given to patient Early Chars, RN

## 2015-08-02 NOTE — Patient Instructions (Signed)
Discharge instructions given. Handout on polyps. Resume previous medications. YOU HAD AN ENDOSCOPIC PROCEDURE TODAY AT THE [facility]:   Refer to the procedure report that was given to you for any specific questions about what was found during the examination.  If the procedure report does not answer your questions, please call your gastroenterologist to clarify.  If you requested that your care partner not be given the details of your procedure findings, then the procedure report has been included in a sealed envelope for you to review at your convenience later.  YOU SHOULD EXPECT: Some feelings of bloating in the abdomen. Passage of more gas than usual.  Walking can help get rid of the air that was put into your GI tract during the procedure and reduce the bloating. If you had a lower endoscopy (such as a colonoscopy or flexible sigmoidoscopy) you may notice spotting of blood in your stool or on the toilet paper. If you underwent a bowel prep for your procedure, you may not have a normal bowel movement for a few days.  Please Note:  You might notice some irritation and congestion in your nose or some drainage.  This is from the oxygen used during your procedure.  There is no need for concern and it should clear up in a day or so.  SYMPTOMS TO REPORT IMMEDIATELY:   Following lower endoscopy (colonoscopy or flexible sigmoidoscopy):  Excessive amounts of blood in the stool  Significant tenderness or worsening of abdominal pains  Swelling of the abdomen that is new, acute  Fever of 100F or higher   For urgent or emergent issues, a gastroenterologist can be reached at any hour by calling [phone].   DIET: Your first meal following the procedure should be a small meal and then it is ok to progress to your normal diet. Heavy or fried foods are harder to digest and may make you feel nauseous or bloated.  Likewise, meals heavy in dairy and vegetables can increase bloating.  Drink  plenty of fluids but you should avoid alcoholic beverages for 24 hours.  ACTIVITY:  You should plan to take it easy for the rest of today and you should NOT DRIVE or use heavy machinery until tomorrow (because of the sedation medicines used during the test).    FOLLOW UP: Our staff will call the number listed on your records the next business day following your procedure to check on you and address any questions or concerns that you may have regarding the information given to you following your procedure. If we do not reach you, we will leave a message.  However, if you are feeling well and you are not experiencing any problems, there is no need to return our call.  We will assume that you have returned to your regular daily activities without incident.  If any biopsies were taken you will be contacted by phone or by letter within the next 1-3 weeks.  Please call us at [phone] if you have not heard about the biopsies in 3 weeks.    SIGNATURES/CONFIDENTIALITY: You and/or your care partner have signed paperwork which will be entered into your electronic medical record.  These signatures attest to the fact that that the information above on your After Visit Summary has been reviewed and is understood.  Full responsibility of the confidentiality of this discharge information lies with you and/or your care-partner. 

## 2015-08-02 NOTE — Progress Notes (Signed)
Called to room to assist during endoscopic procedure.  Patient ID and intended procedure confirmed with present staff. Received instructions for my participation in the procedure from the performing physician.  

## 2015-08-02 NOTE — Op Note (Signed)
[place]  [address]. [place], [id number]   COLONOSCOPY PROCEDURE REPORT  PATIENT: [name], Dawn  MR#: [id number] BIRTHDATE: 1951/07/04 , 36  yrs. old GENDER: female ENDOSCOPIST: Yetta Flock, MD REFERRED BY: PROCEDURE DATE:  08/02/2015 PROCEDURE:   Colonoscopy, surveillance and Colonoscopy with biopsy First Screening Colonoscopy - Avg.  risk and is 50 yrs.  old or older - No.  Prior Negative Screening - Now for repeat screening. N/A  History of Adenoma - Now for follow-up colonoscopy & has been > or = to 3 yrs.  No.  It has been less than 3 yrs since last colonoscopy.  Other: See Comments  Polyps removed today? Yes ASA CLASS:   Class II INDICATIONS:Surveillance due to prior colonic neoplasia and Colorectal Neoplasm Risk Assessment for this procedure is average risk.  Patient had one adenoma (small) on colonoscopy last month but prep was poor , here for repeat exam MEDICATIONS: Propofol 175 mg IV  DESCRIPTION OF PROCEDURE:   After the risks benefits and alternatives of the procedure were thoroughly explained, informed consent was obtained.  The digital rectal exam revealed no abnormalities of the rectum.   The LB PFC-H190 [id number]  endoscope was introduced through the anus and advanced to the cecum, which was identified by both the appendix and ileocecal valve. No adverse events experienced.   The quality of the prep was adequate  The instrument was then slowly withdrawn as the colon was fully examined. Estimated blood loss is zero unless otherwise noted in this procedure report.  COLON FINDINGS: Two sessile polyps measuring 3 mm in size were found in the rectum.  A polypectomy was performed with cold forceps.  The resection was complete, the polyp tissue was completely retrieved and sent to histology.   The examination was otherwise normal. Retroflexed views revealed internal hemorrhoids. The time to cecum = 4.8 Withdrawal time = 12.0   The scope  was withdrawn and the procedure completed. COMPLICATIONS: There were no immediate complications.  ENDOSCOPIC IMPRESSION: 1.   Two sessile polyps were found in the rectum; polypectomy was performed with cold forceps 2.   The examination was otherwise normal  RECOMMENDATIONS: 1.  Await pathology results 2.  Resume diet 3.  Resume medications  eSigned:  Yetta Flock, MD 08/02/2015 9:35 AM   cc:  the patient

## 2015-08-02 NOTE — Progress Notes (Signed)
Transferred to recovery room. A/O x3, pleased with MAC.  VSS.  Report to Celia, RN. 

## 2015-08-05 ENCOUNTER — Telehealth: Payer: Self-pay | Admitting: *Deleted

## 2015-08-05 NOTE — Telephone Encounter (Signed)
  Follow up Call-  Call back number 08/02/2015 07/11/2015  Post procedure Call Back phone  # [phone]  Permission to leave phone message Yes Yes     Patient questions:  Do you have a fever, pain , or abdominal swelling? No. Pain Score  0 *  Have you tolerated food without any problems? Yes.    Have you been able to return to your normal activities? Yes.    Do you have any questions about your discharge instructions: Diet   No. Medications  No. Follow up visit  No.  Do you have questions or concerns about your Care? No.  Actions: * If pain score is 4 or above: No action needed, pain <4.

## 2015-08-07 ENCOUNTER — Encounter: Payer: Self-pay | Admitting: Gastroenterology

## 2015-08-15 NOTE — Telephone Encounter (Signed)
Error

## 2015-12-06 ENCOUNTER — Inpatient Hospital Stay: Admit: 2015-12-06 | Discharge: 2015-12-06 | Disposition: A | Discharge status: Home / Self Care | Payer: MEDICARE

## 2015-12-06 DIAGNOSIS — M5441 Lumbago with sciatica, right side: Secondary | ICD-10-CM

## 2015-12-06 MED ORDER — OXYCODONE-ACETAMINOPHEN 5-325 MG PO TABS
5-325 MG | ORAL_TABLET | ORAL | 0 refills | Status: AC | PRN
Start: 2015-12-06 — End: 2015-12-13

## 2015-12-06 MED ORDER — KETOROLAC TROMETHAMINE 60 MG/2ML IM SOLN
60 MG/2ML | Freq: Once | INTRAMUSCULAR | Status: AC
Start: 2015-12-06 — End: 2015-12-06
  Administered 2015-12-06: 23:00:00 60 mg via INTRAMUSCULAR

## 2015-12-06 MED ORDER — HYDROCODONE-ACETAMINOPHEN 5-325 MG PO TABS
5-325 MG | ORAL_TABLET | Freq: Four times a day (QID) | ORAL | 0 refills | Status: DC | PRN
Start: 2015-12-06 — End: 2015-12-06

## 2015-12-06 MED ORDER — CYCLOBENZAPRINE HCL 10 MG PO TABS
10 MG | ORAL_TABLET | Freq: Three times a day (TID) | ORAL | 0 refills | Status: DC | PRN
Start: 2015-12-06 — End: 2023-08-27

## 2015-12-06 MED ORDER — PREDNISONE 10 MG PO TABS
10 MG | ORAL_TABLET | Freq: Every day | ORAL | 0 refills | Status: AC
Start: 2015-12-06 — End: 2015-12-11

## 2015-12-06 MED FILL — KETOROLAC TROMETHAMINE 60 MG/2ML IM SOLN: 60 MG/2ML | INTRAMUSCULAR | Qty: 2

## 2015-12-06 NOTE — ED Triage Notes (Signed)
Pt c/o arthritis flare up. States pain in her back is radiating to left arm and right leg.  States  Dr Garald Braver used to give her percocet that helped with the pain but he has since left and she cannot get relief from advil, motrin or any otc product

## 2015-12-06 NOTE — ED Provider Notes (Signed)
[facility] ED  eMERGENCY dEPARTMENT eNCOUnter      Pt Name: Daisy Orr  MRN: [id number]  Birthdate [birth year]  Date of evaluation: 12/06/2015  Provider: Wallace Cullens, CNP      HISTORY OF PRESENT ILLNESS    Daisy Orr is a 65 y.o. female who presents to the Emergency Department with low back pain that radiates down her R leg x 10 days.  She also is having L FA pain, worse with movement x 1 week.  Denies saddle anesthesia, foot drop, incontinence of bowel or bladder.        REVIEW OF SYSTEMS       Review of Systems   Constitutional: Negative for fever.   HENT: Negative for congestion.    Respiratory: Negative for cough and shortness of breath.    Cardiovascular: Negative for chest pain.   Gastrointestinal: Negative for abdominal pain.   Genitourinary: Negative for dysuria.   Musculoskeletal: Positive for back pain. Negative for arthralgias.        L FA pain   Skin: Negative for rash.   All other systems reviewed and are negative.        PAST MEDICAL HISTORY     Past Medical History   Diagnosis Date   ??? Arthritis    ??? Back pain    ??? COPD (chronic obstructive pulmonary disease) (HCC)    ??? Ulcer (HCC)          SURGICAL HISTORY     History reviewed. No pertinent past surgical history.      CURRENT MEDICATIONS       Previous Medications    No medications on file       ALLERGIES     Review of patient's allergies indicates no known allergies.    FAMILY HISTORY     History reviewed. No pertinent family history.       SOCIAL HISTORY       Social History     Social History   ??? Marital status: Divorced     Spouse name: N/A   ??? Number of children: N/A   ??? Years of education: N/A     Social History Main Topics   ??? Smoking status: Current Every Day Smoker   ??? Smokeless tobacco: None   ??? Alcohol use No   ??? Drug use: No   ??? Sexual activity: Not Asked     Other Topics Concern   ??? None     Social History Narrative   ??? None       SCREENINGS             PHYSICAL EXAM    (up to 7 for level 4, 8 or more for level 5)   ED  Triage Vitals   BP Temp Temp Source Pulse Resp SpO2 Height Weight   12/06/15 1647 12/06/15 1647 12/06/15 1647 12/06/15 1647 12/06/15 1647 12/06/15 1647 12/06/15 1647 12/06/15 1647   141/95 98.3 ??F (36.8 ??C) Oral 97 20 98 % 5' (1.524 m) 139 lb (63 kg)       Physical Exam   Constitutional: She is oriented to person, place, and time. She appears well-developed and well-nourished.   HENT:   Head: Normocephalic and atraumatic.   Right Ear: External ear normal.   Left Ear: External ear normal.   Mouth/Throat: Oropharynx is clear and moist.   Eyes: Conjunctivae and EOM are normal. Pupils are equal, round, and reactive to light.   Neck: Normal range of  motion. Neck supple.   Cardiovascular: Normal rate and regular rhythm.    Pulmonary/Chest: Effort normal and breath sounds normal.   Abdominal: Soft. Bowel sounds are normal. She exhibits no distension. There is no tenderness.   Musculoskeletal: Normal range of motion.        Lumbar back: She exhibits tenderness and pain. She exhibits normal range of motion, no bony tenderness, no swelling, no edema, no deformity, no laceration, no spasm and normal pulse.        Back:         Left forearm: She exhibits tenderness. She exhibits no bony tenderness, no swelling, no edema, no deformity and no laceration.   Neurological: She is alert and oriented to person, place, and time. She has normal reflexes.   Skin: Skin is warm and dry.   Psychiatric: Judgment normal.   Nursing note and vitals reviewed.        All other labs were within normal range or not returned as of this dictation.    EMERGENCY DEPARTMENT COURSE and DIFFERENTIAL DIAGNOSIS/MDM:   Vitals:    Vitals:    12/06/15 1647   BP: (!) 141/95   Pulse: 97   Resp: 20   Temp: 98.3 ??F (36.8 ??C)   TempSrc: Oral   SpO2: 98%   Weight: 139 lb (63 kg)   Height: 5' (1.524 m)       ED Course       65 yr old female with Low back pain with sciatica-R side and L FA strain.  Medicated with ketorolac in ER.  Patient was given prescription for  Medrol Dosepak, Norco and Flexeril.  Patient  to f/u with PCP in one to 2 days if symptoms persist.  Patient verbalizes understanding.Marland Kitchen      PROCEDURES:  Unless otherwise noted below, none     Procedures      FINAL IMPRESSION      1. Back pain with sciatica    2. Forearm strain, left, initial encounter          DISPOSITION/PLAN   DISPOSITION Decision to Discharge        Wallace Cullens, CNP (electronically signed)  Attending Emergency Physician       Wallace Cullens, CNP  12/07/15 1848

## 2016-03-18 ENCOUNTER — Other Ambulatory Visit: Payer: Self-pay

## 2016-03-18 ENCOUNTER — Other Ambulatory Visit: Payer: Self-pay | Admitting: Internal Medicine

## 2016-03-18 DIAGNOSIS — Z1231 Encounter for screening mammogram for malignant neoplasm of breast: Secondary | ICD-10-CM

## 2016-03-26 ENCOUNTER — Inpatient Hospital Stay: Admission: RE | Admit: 2016-03-26 | Payer: Managed Care, Other (non HMO) | Source: Ambulatory Visit

## 2016-03-28 ENCOUNTER — Inpatient Hospital Stay: Admit: 2016-03-28 | Discharge: 2016-03-28 | Disposition: A | Discharge status: Home / Self Care | Payer: MEDICARE

## 2016-03-28 DIAGNOSIS — M5441 Lumbago with sciatica, right side: Secondary | ICD-10-CM

## 2016-03-28 MED ORDER — PREDNISONE 10 MG PO TABS
10 MG | ORAL_TABLET | Freq: Every day | ORAL | 0 refills | Status: AC
Start: 2016-03-28 — End: 2016-04-02

## 2016-03-28 MED ORDER — HYDROCODONE-ACETAMINOPHEN 5-325 MG PO TABS
5-325 MG | ORAL_TABLET | Freq: Four times a day (QID) | ORAL | 0 refills | Status: DC | PRN
Start: 2016-03-28 — End: 2016-03-28

## 2016-03-28 MED ORDER — NAPROXEN 500 MG PO TABS
500 MG | Freq: Once | ORAL | Status: AC
Start: 2016-03-28 — End: 2016-03-28
  Administered 2016-03-28: 21:00:00 500 mg via ORAL

## 2016-03-28 MED ORDER — OXYCODONE-ACETAMINOPHEN 5-325 MG PO TABS
5-325 MG | ORAL_TABLET | ORAL | 0 refills | Status: AC | PRN
Start: 2016-03-28 — End: 2016-04-04

## 2016-03-28 MED FILL — NAPROXEN 500 MG PO TABS: 500 MG | ORAL | Qty: 1

## 2016-03-28 NOTE — ED Provider Notes (Signed)
[facility]  eMERGENCY dEPARTMENT eNCOUnter      Pt Name: Daisy OilerRuth Orr  MRN: [id number]  Birthdate 03/18/1951  Date of evaluation: 03/28/2016  Provider: Wallace CullensNina S Azaliah Carrero, CNP      HISTORY OF PRESENT ILLNESS    Daisy OilerRuth Orr is a 65 y.o. female who presents to the Emergency Department with low R side back pain that radiates to the R leg.   Denies recent injury.  Denies saddle anesthesia, foot drop or incontinence of bowel or bladder.         REVIEW OF SYSTEMS       Review of Systems   Constitutional: Negative for fever.   HENT: Negative for congestion.    Respiratory: Negative for cough and shortness of breath.    Cardiovascular: Negative for chest pain.   Gastrointestinal: Negative for abdominal pain.   Genitourinary: Negative for dysuria.   Musculoskeletal: Positive for back pain. Negative for arthralgias.   Skin: Negative for rash.   All other systems reviewed and are negative.        PAST MEDICAL HISTORY     Past Medical History:   Diagnosis Date   ??? Arthritis    ??? Back pain    ??? COPD (chronic obstructive pulmonary disease) (HCC)    ??? Ulcer (HCC)          SURGICAL HISTORY     History reviewed. No pertinent surgical history.      CURRENT MEDICATIONS       Previous Medications    CYCLOBENZAPRINE (FLEXERIL) 10 MG TABLET    Take 1 tablet by mouth 3 times daily as needed for Muscle spasms    VITAMIN D (ERGOCALCIFEROL) 50000 UNITS CAPS CAPSULE    Take 50,000 Units by mouth once a week       ALLERGIES     Review of patient's allergies indicates no known allergies.    FAMILY HISTORY     History reviewed. No pertinent family history.       SOCIAL HISTORY       Social History     Social History   ??? Marital status: Divorced     Spouse name: N/A   ??? Number of children: N/A   ??? Years of education: N/A     Social History Main Topics   ??? Smoking status: Current Every Day Smoker   ??? Smokeless tobacco: None   ??? Alcohol use No   ??? Drug use: No   ??? Sexual activity: Not Asked     Other Topics Concern   ??? None     Social  History Narrative       SCREENINGS             PHYSICAL EXAM    (up to 7 for level 4, 8 or more for level 5)   ED Triage Vitals   BP Temp Temp Source Pulse Resp SpO2 Height Weight   03/28/16 1607 03/28/16 1607 03/28/16 1607 03/28/16 1607 03/28/16 1607 03/28/16 1607 03/28/16 1607 03/28/16 1607   158/90 98.2 ??F (36.8 ??C) Tympanic 88 20 98 % 5' (1.524 m) 140 lb (63.5 kg)       Physical Exam   Constitutional: She is oriented to person, place, and time. She appears well-developed and well-nourished.   HENT:   Head: Normocephalic and atraumatic.   Right Ear: External ear normal.   Left Ear: External ear normal.   Mouth/Throat: Oropharynx is clear and moist.   Eyes: Conjunctivae and EOM are  normal. Pupils are equal, round, and reactive to light.   Neck: Normal range of motion. Neck supple.   Cardiovascular: Normal rate and regular rhythm.    Pulmonary/Chest: Effort normal and breath sounds normal.   Abdominal: Soft. Bowel sounds are normal. She exhibits no distension. There is no tenderness.   Musculoskeletal: Normal range of motion.        Lumbar back: She exhibits tenderness, pain and spasm.        Back:    Neurological: She is alert and oriented to person, place, and time. She has normal reflexes.   Skin: Skin is warm and dry.   Psychiatric: Judgment normal.   Nursing note and vitals reviewed.        All other labs were within normal range or not returned as of this dictation.    EMERGENCY DEPARTMENT COURSE and DIFFERENTIAL DIAGNOSIS/MDM:   Vitals:    Vitals:    03/28/16 1607   BP: (!) 158/90   Pulse: 88   Resp: 20   Temp: 98.2 ??F (36.8 ??C)   TempSrc: Tympanic   SpO2: 98%   Weight: 140 lb (63.5 kg)   Height: 5' (1.524 m)       ED Course       65 year old female right lower back pain that radiates to the right leg.  She was given Naprosyn in the ER.  She was given prescriptions for prednisone and Percocet.  She is to follow-up with orthopedics in 2-3 days as.  Patient verbalizes understanding.      PROCEDURES:  Unless  otherwise noted below, none     Procedures      FINAL IMPRESSION      1. Acute right-sided low back pain with right-sided sciatica          DISPOSITION/PLAN   DISPOSITION Decision to Discharge        Wallace Cullens, CNP (electronically signed)  Attending Emergency Physician       Wallace Cullens, CNP  03/28/16 1626

## 2016-05-13 ENCOUNTER — Other Ambulatory Visit: Payer: Self-pay | Admitting: Sports Medicine

## 2016-05-13 DIAGNOSIS — M545 Low back pain: Secondary | ICD-10-CM

## 2016-05-13 DIAGNOSIS — M48061 Spinal stenosis, lumbar region without neurogenic claudication: Secondary | ICD-10-CM

## 2016-05-14 ENCOUNTER — Other Ambulatory Visit: Payer: Self-pay | Admitting: Sports Medicine

## 2016-05-14 DIAGNOSIS — M545 Low back pain: Secondary | ICD-10-CM

## 2016-05-14 DIAGNOSIS — R2681 Unsteadiness on feet: Secondary | ICD-10-CM

## 2016-05-14 DIAGNOSIS — M48061 Spinal stenosis, lumbar region without neurogenic claudication: Secondary | ICD-10-CM

## 2016-05-29 ENCOUNTER — Ambulatory Visit
Admission: RE | Admit: 2016-05-29 | Discharge: 2016-05-29 | Disposition: A | Discharge status: Home / Self Care | Payer: Managed Care, Other (non HMO) | Source: Ambulatory Visit | Attending: Sports Medicine | Admitting: Sports Medicine

## 2016-05-29 ENCOUNTER — Ambulatory Visit: Payer: Managed Care, Other (non HMO)

## 2016-05-29 DIAGNOSIS — M48061 Spinal stenosis, lumbar region without neurogenic claudication: Secondary | ICD-10-CM

## 2016-05-29 DIAGNOSIS — R2681 Unsteadiness on feet: Secondary | ICD-10-CM

## 2016-05-29 DIAGNOSIS — M545 Low back pain: Secondary | ICD-10-CM

## 2016-06-05 ENCOUNTER — Ambulatory Visit: Payer: Managed Care, Other (non HMO)

## 2016-06-15 ENCOUNTER — Ambulatory Visit: Payer: Managed Care, Other (non HMO) | Admitting: Family

## 2016-06-16 ENCOUNTER — Telehealth: Payer: Self-pay | Admitting: Neurology

## 2016-06-16 NOTE — Telephone Encounter (Signed)
Patient seeing Dr.Sethi on 06/17/2016. Message sent to [place].

## 2016-06-16 NOTE — Telephone Encounter (Signed)
[facility] called stating that pt was referred to them by Dr. Paulla Fore but Dr. Joya Salm wanted to let Dr. 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

## 2016-06-17 ENCOUNTER — Encounter: Payer: Self-pay | Admitting: Neurology

## 2016-06-17 ENCOUNTER — Ambulatory Visit (INDEPENDENT_AMBULATORY_CARE_PROVIDER_SITE_OTHER): Payer: Managed Care, Other (non HMO) | Admitting: Neurology

## 2016-06-17 DIAGNOSIS — G912 (Idiopathic) normal pressure hydrocephalus: Secondary | ICD-10-CM | POA: Diagnosis not present

## 2016-06-17 DIAGNOSIS — F0390 Unspecified dementia without behavioral disturbance: Secondary | ICD-10-CM | POA: Diagnosis not present

## 2016-06-17 DIAGNOSIS — N3941 Urge incontinence: Secondary | ICD-10-CM | POA: Diagnosis not present

## 2016-06-17 DIAGNOSIS — R32 Unspecified urinary incontinence: Secondary | ICD-10-CM | POA: Insufficient documentation

## 2016-06-17 NOTE — Progress Notes (Signed)
[facility] [address] [place]. [place] [address] [phone]       OFFICE CONSULT NOTE  Dawn. 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 She was previously evaluated by me 3 years ago and at that time she had a high volume spinal tap done and shown improvement in her gait imbalance but for unclear reason VP shunt surgery was never done. Patient also had a lacunar infarct and that time and maintain relative improvement from that. She was lost to follow-up in my clinic. The patient is accompanied today by her son who state that they have noticed increasing gait and balance difficulties as well as memory loss, dementia and dementia as well as bladder incontinence over the last 3-6 months. Patient was seen by [place] [facility] recently who ordered a brain MRI scan done on 05/29/16 which shows progressive communicating hydrocephalus and changes of small vessel disease compared with previous imaging studies. Patient in fact has an appointment to see Dr. Joya Salm neurosurgeon to discuss shunt surgery next week. The patient has gradually had trouble with balance and walking and is now using a wheeled walker. She has trouble getting started and at times may infect freeze when she tries to make a turn. She is fortunately had no major falls or injuries. She has also had bladder incontinence and has bladder urgency and cannot hold her bladder until she reaches a target. This is been particularly worse over the last 2-3 months. Over the last 4 months or so her memory has also declined and she is now living with the daughter-in-law and son and needs 24-hour supervision. She has not had any recent evaluation for treatable causes of memory loss. She remains on aspirin for stroke  prevention is tolerating it well. Her blood pressure is well controlled though today it is slightly elevated at 151/89. Sugars according to the son  Are also satisfactory. She's had no recurrent stroke or TIA symptoms.  ROS:   14 system review of systems is positive for  memory loss, weakness, urinary incontinence, joint pain and swelling, back pain, walking difficulty, skin moles and all other systems negative PMH:  Past Medical History:  Diagnosis Date  . Altered mental status 09/08/13   taken to [facility] ER  . Arthritis    spine  . Blood transfusion without reported diagnosis   . Diabetes mellitus without complication ([place])   . GERD (gastroesophageal reflux disease)   . Heart rate fast    runs 90's -110 is normal for her   . Hyperlipidemia   . Hypertension   . 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 Scoliosis   . Shortness of breath    Hx in past 25 yrs ago  . Stroke ([place]) 01/11/13   right corona radiata infarct  . SVD (spontaneous vaginal delivery)    x 3    Social History:  Social History   Social History  . Marital status: Married    Spouse name: Jenny Reichmann  . Number of children: 3  . Years of education: 12   Occupational History  . disability    Social History Main Topics  . Smoking status: Former Smoker    Packs/day: 1.00    Years: 20.00  Types: Cigarettes    Quit date: 10/20/1983  . Smokeless tobacco: Never Used  . Alcohol use No  . Drug use: No  . Sexual activity: Yes    Birth control/ protection: Post-menopausal   Other Topics Concern  . Not on file   Social History Narrative  . No narrative on file    Medications:   Current Outpatient Prescriptions on File Prior to Visit  Medication Sig Dispense Refill  . amLODipine (NORVASC) 10 MG tablet     . aspirin 81 MG chewable tablet Chew 1 tablet (81 mg total) by mouth daily. DO NOT TAKE FOR 1 WEEK. May resume on 03/29/2015.    Marland Kitchen BENICAR  HCT 20-12.5 MG per tablet Take 1 tablet by mouth daily.     Marland Kitchen gabapentin (NEURONTIN) 100 MG capsule Take 1 capsule (100 mg total) by mouth 3 (three) times daily. 90 capsule 1  . hydrALAZINE (APRESOLINE) 25 MG tablet     . metFORMIN (GLUCOPHAGE) 1000 MG tablet Take 1,000 mg by mouth 2 (two) times daily with a meal.    . Multiple Vitamins-Minerals (MULTIVITAMIN WITH MINERALS) tablet Take 1 tablet by mouth daily.    . pantoprazole (PROTONIX) 40 MG tablet     . simvastatin (ZOCOR) 20 MG tablet Take 1 tablet (20 mg total) by mouth daily at 6 PM. 30 tablet 1   No current facility-administered medications on file prior to visit.     Allergies:   Allergies  Allergen Reactions  . Penicillins Swelling    Physical Exam General: well developed, well nourished, seated, in no evident distress Head: head normocephalic and atraumatic.   Neck: supple with no carotid or supraclavicular bruits Cardiovascular: regular rate and rhythm, no murmurs Musculoskeletal: no deformity Skin:  no rash/petichiae Vascular:  Normal pulses all extremities  Neurologic Exam Mental Status: Awake and fully alert. Oriented to place and time. Recent and remote memory Poor. Attention span, concentration and fund of knowledge diminished. Mini-Mental status exam scored 18/30 with deficits in orientation, registration, attention, calculation, recall and copying intersecting pentagons. Able to name only 58 legged animals. Clock drawing 1/4. Mood and affect appropriate.  Cranial Nerves: Fundoscopic exam reveals sharp disc margins. Pupils equal, briskly reactive to light. Extraocular movements full without nystagmus. Visual fields full to confrontation. Hearing intact. Facial sensation intact. Face, tongue, palate moves normally and symmetrically.  Motor: Normal bulk and tone. Normal strength in all tested extremity muscles. Diminished fine finger movements on the left. Mild left grip weakness. Orbits right over left upper  extremity. Sensory.: intact to touch , pinprick , position and vibratory sensation.  Coordination: Rapid alternating movements normal in all extremities. Finger-to-nose and heel-to-shin performed accurately bilaterally. Gait and Station: Arises from chair with slight difficulty. Stance is broad-based and uses a Fleet walker to walk. Slight apraxia while making a turn and sitting down. Reflexes: 1+ and symmetric. Toes downgoing.       ASSESSMENT:  45 year  lady with documented communicating hydrocephalus on brain scans going back to 2007 but with recent clinical worsening: Last several months with progressive dementia, incontinence and gait and balance difficulties favor the clinical triad of normal pressure hydrocephalus. However reversible causes need to be ruled out as well. History of right brain lacunar infarct in March 2014 with vascular risk factors of hypertension, diabetes, hyperlipidemia and intracranial atherosclerosis    PLAN: I had a long discussion with the patient and her son regarding her  communicating hydrocephalus on prior brain scans but recent worsening  of her memory and incontinence as well as gait and balance certainly would favor the clinical triad of normal pressure hydrocephalus. I recommend she keep upcoming appointment with neurosurgeon Dr. Joya Salm to discuss possible shunt treatment. She did have improvement in her gait imbalance in 2014 when she had had high-volume diagnostic spinal tap done but I'm not sure as to why she did not undergo shunt surgery.. I also recommend looking for reversible causes of dementia but checking lab work and EEG. I encouraged her to use a walker at all times for fall and safety precautions. Continue aspirin for stroke prevention and maintain strict control of hypertension with blood pressure goal below 130/90, lipids with LDL cholesterol goal below 70 mg percent and diabetes with him about an A1c goal below 6.5. Greater than 50% time during this  45 minute visit were spent on counseling and coordination of care about hydrocephalus, stroke and answering questions She will return for follow-up in 2 months or call earlier if necessary. Antony Contras, MD  [facility] [address] [place], [place] [address]  Phone [phone] Fax [phone] Note: This document was prepared with digital dictation and possible smart phrase technology. Any transcriptional errors that result from this process are unintentional.

## 2016-06-17 NOTE — Patient Instructions (Signed)
I had a long discussion with the patient and her son regarding her long-standing communicating hydrocephalus on prior brain scans but recent worsening of her memory and incontinence as well as gait and balance certainly would favor the clinical triad of normal pressure hydrocephalus. I recommend she keep upcoming appointment with neurosurgeon Dr. Joya Salm to discuss possible shunt treatment. She did have improvement in her gait imbalance in 2014 when she had had high-volume diagnostic spinal tap done but I'm not sure as to why she did not undergo shunt surgery.. I also recommend looking for reversible causes of dementia but checking lab work and EEG. I encouraged her to use a walker at all times for fall and safety precautions. Continue aspirin for stroke prevention and maintain strict control of hypertension with blood pressure goal below 130/90, lipids with LDL cholesterol goal below 70 mg percent and diabetes with him about an A1c goal below 6.5. She will return for follow-up in 2 months or call earlier if necessary.  Normal-Pressure Hydrocephalus Normal-pressure hydrocephalus (NPH) is a buildup of fluid (cerebrospinal fluid [CSF]) inside the brain. CSF is normal within the brain, but too much fluid can affect your brain's function. NPH commonly occurs in people older than 63 years of age.  CAUSES  The cause of NPH is not always known. It can be caused by anything that blocks the flow of CSF.  SIGNS AND SYMPTOMS  Since many symptoms of NPH are also associated with conditions sometimes seen in older people, taking care to note changes in behavior and mental function is important. Symptoms of NPH may include:   Difficulty walking, such as:  Problems when beginning to walk.  Feet being "frozen" to the floor.  Shuffling feet when walking.  Unsteadiness.  Problems with bowel and bladder control.  Memory problems such as forgetfulness, lack of concentration, dull mood, or confusion. DIAGNOSIS   A  medical history and physical exam will be done. A physical exam can reveal walking (gait) changes. Reflexes may be increased in the lower legs.  Tests can include:  Lumbar puncture (spinal tap).  CT scan of your head.  MRI scan of your head. TREATMENT  NPH is treated by having surgery to place a ventriculoperitoneal shunt in the brain. The ventriculoperitoneal shunt drains the excess CSF.    This information is not intended to replace advice given to you by your health care provider. Make sure you discuss any questions you have with your health care provider.   Document Released: 02/19/2014 Document Reviewed: 02/19/2014 Elsevier Interactive Patient Education [employer].

## 2016-06-18 LAB — DEMENTIA PANEL
Homocysteine: 12.4 umol/L (ref 0.0–15.0)
RPR Ser Ql: NONREACTIVE
TSH: 0.949 u[IU]/mL (ref 0.450–4.500)
VITAMIN B 12: 377 pg/mL (ref 211–946)

## 2016-06-18 LAB — URINALYSIS, ROUTINE W REFLEX MICROSCOPIC
Bilirubin, UA: NEGATIVE
GLUCOSE, UA: NEGATIVE
Ketones, UA: NEGATIVE
Leukocytes, UA: NEGATIVE
NITRITE UA: NEGATIVE
PH UA: 7 (ref 5.0–7.5)
Protein, UA: NEGATIVE
RBC, UA: NEGATIVE
Specific Gravity, UA: 1.009 (ref 1.005–1.030)
Urobilinogen, Ur: 0.2 mg/dL (ref 0.2–1.0)

## 2016-07-08 ENCOUNTER — Other Ambulatory Visit: Payer: Self-pay | Admitting: Neurosurgery

## 2016-07-14 ENCOUNTER — Ambulatory Visit (INDEPENDENT_AMBULATORY_CARE_PROVIDER_SITE_OTHER): Payer: Managed Care, Other (non HMO) | Admitting: Neurology

## 2016-07-14 DIAGNOSIS — R410 Disorientation, unspecified: Secondary | ICD-10-CM

## 2016-07-14 DIAGNOSIS — F0390 Unspecified dementia without behavioral disturbance: Secondary | ICD-10-CM

## 2016-07-15 NOTE — Procedures (Signed)
   HISTORY: 65 years old female, with history of communicating hydrocephalus, presented with slow progressive gait abnormality, bladder incontinence.  TECHNIQUE:  16 channel EEG was performed based on standard 10-16 international system. One channel was dedicated to EKG, which has demonstrates tachycardia, 120 beats per minutes.  Upon awakening, the posterior background activity was mildly dysrhythmic in the theta range 7 Hz, reactive to eye opening and closure.  There was no evidence of epileptiform discharge.  Photic stimulation was performed, which induced a symmetric photic driving.  Hyperventilation was not performed  No sleep was achieved.  CONCLUSION: This is an abnormal awake EEG.  There is electrodiagnostic evidence of generalized background slowing, indicated by hemisphere malfunction, common etiology are metabolic toxic, advanced central nervous system degenerative disorders.  Marcial Pacas, M.D. Ph.D.  [facility] [place], [place] [address] Phone: [phone] Fax:      [phone]

## 2016-07-21 NOTE — Pre-Procedure Instructions (Signed)
Dawn [name]  07/21/2016      [facility] ([facility] [place] [id number] [place] ([place]) [place] [address] Phone: [phone] Fax: [phone]    Your procedure is scheduled on Tuesday October 10.  Report to [facility] Admitting at 9:15 A.M.  Call this number if you have problems the morning of surgery:  [phone]   Remember:  Do not eat food or drink liquids after midnight.  Take these medicines the morning of surgery with A SIP OF WATER: amlodipine (norvasc), hydralazine (apresoline), pantoprazole (protonix)  7 days prior to surgery STOP taking any Aspirin, Aleve, Naproxen, Ibuprofen, Motrin, Advil, Goody's, BC's, all herbal medications, fish oil, and all vitamins  WHAT DO I DO ABOUT MY DIABETES MEDICATION?   Marland Kitchen Do not take oral diabetes medicines (pills) the morning of surgery. DO NOT TAKE metformin (Glucophage) the day of surgery.  . THE DAY BEFORE SURGERY, take 10 units of Humulin 70/30 insulin.       Marland Kitchen HE MORNING OF SURGERY DO NOT TAKE Humulin 70/30 Insulin     How to Manage Your Diabetes Before and After Surgery  Why is it important to control my blood sugar before and after surgery? . Improving blood sugar levels before and after surgery helps healing and can limit problems. . A way of improving blood sugar control is eating a healthy diet by: o  Eating less sugar and carbohydrates o  Increasing activity/exercise o  Talking with your doctor about reaching your blood sugar goals . High blood sugars (greater than 180 mg/dL) can raise your risk of infections and slow your recovery, so you will need to focus on controlling your diabetes during the weeks before surgery. . Make sure that the doctor who takes care of your diabetes knows about your planned surgery including the date and location.  How do I manage my blood sugar before surgery? . Check your blood sugar at least 4 times a day, starting 2 days before  surgery, to make sure that the level is not too high or low. o Check your blood sugar the morning of your surgery when you wake up and every 2 hours until you get to the Short Stay unit. . If your blood sugar is less than 70 mg/dL, you will need to treat for low blood sugar: o Do not take insulin. o Treat a low blood sugar (less than 70 mg/dL) with  cup of clear juice (cranberry or apple), 4 glucose tablets, OR glucose gel. o Recheck blood sugar in 15 minutes after treatment (to make sure it is greater than 70 mg/dL). If your blood sugar is not greater than 70 mg/dL on recheck, call [phone] for further instructions. . Report your blood sugar to the short stay nurse when you get to Short Stay.  . If you are admitted to the hospital after surgery: o Your blood sugar will be checked by the staff and you will probably be given insulin after surgery (instead of oral diabetes medicines) to make sure you have good blood sugar levels. o The goal for blood sugar control after surgery is 80-180 mg/dL.               Do not wear jewelry, make-up or nail polish.  Do not wear lotions, powders, or perfumes, or deoderant.  Do not shave 48 hours prior to surgery.    Do not bring valuables to the hospital.  [facility] is  not responsible for any belongings or valuables.  Contacts, dentures or bridgework may not be worn into surgery.  Leave your suitcase in the car.  After surgery it may be brought to your room.  For patients admitted to the hospital, discharge time will be determined by your treatment team.  Patients discharged the day of surgery will not be allowed to drive home.    Special instructions:     [facility]- Preparing For Surgery  Before surgery, you can play an important role. Because skin is not sterile, your skin needs to be as free of germs as possible. You can reduce the number of germs on your skin by washing with CHG (chlorahexidine gluconate) Soap before surgery.   CHG is an antiseptic cleaner which kills germs and bonds with the skin to continue killing germs even after washing.  Please do not use if you have an allergy to CHG or antibacterial soaps. If your skin becomes reddened/irritated stop using the CHG.  Do not shave (including legs and underarms) for at least 48 hours prior to first CHG shower. It is OK to shave your face.  Please follow these instructions carefully.   1. Shower the NIGHT BEFORE SURGERY and the MORNING OF SURGERY with CHG.   2. If you chose to wash your hair, wash your hair first as usual with your normal shampoo.  3. After you shampoo, rinse your hair and body thoroughly to remove the shampoo.  4. Use CHG as you would any other liquid soap. You can apply CHG directly to the skin and wash gently with a scrungie or a clean washcloth.   5. Apply the CHG Soap to your body ONLY FROM THE NECK DOWN.  Do not use on open wounds or open sores. Avoid contact with your eyes, ears, mouth and genitals (private parts). Wash genitals (private parts) with your normal soap.  6. Wash thoroughly, paying special attention to the area where your surgery will be performed.  7. Thoroughly rinse your body with warm water from the neck down.  8. DO NOT shower/wash with your normal soap after using and rinsing off the CHG Soap.  9. Pat yourself dry with a CLEAN TOWEL.   10. Wear CLEAN PAJAMAS   11. Place CLEAN SHEETS on your bed the night of your first shower and DO NOT SLEEP WITH PETS.    Day of Surgery: Do not apply any deodorants/lotions. Please wear clean clothes to the hospital/surgery center.      Please read over the following fact sheets that you were given. MRSA Information

## 2016-07-22 ENCOUNTER — Encounter (HOSPITAL_COMMUNITY)
Admission: RE | Admit: 2016-07-22 | Discharge: 2016-07-22 | Disposition: A | Discharge status: Home / Self Care | Payer: Managed Care, Other (non HMO) | Source: Ambulatory Visit | Attending: Neurosurgery | Admitting: Neurosurgery

## 2016-07-22 ENCOUNTER — Encounter (HOSPITAL_COMMUNITY): Payer: Self-pay

## 2016-07-22 DIAGNOSIS — I10 Essential (primary) hypertension: Secondary | ICD-10-CM | POA: Insufficient documentation

## 2016-07-22 DIAGNOSIS — Z01818 Encounter for other preprocedural examination: Secondary | ICD-10-CM | POA: Insufficient documentation

## 2016-07-22 DIAGNOSIS — E119 Type 2 diabetes mellitus without complications: Secondary | ICD-10-CM | POA: Insufficient documentation

## 2016-07-22 HISTORY — DX: Tachycardia, unspecified: R00.0

## 2016-07-22 LAB — BASIC METABOLIC PANEL
ANION GAP: 9 (ref 5–15)
BUN: 13 mg/dL (ref 6–20)
CO2: 28 mmol/L (ref 22–32)
CREATININE: 1.05 mg/dL — AB (ref 0.44–1.00)
Calcium: 9.6 mg/dL (ref 8.9–10.3)
Chloride: 104 mmol/L (ref 101–111)
GFR calc Af Amer: >60 mL/min (ref >60)
GFR calc non Af Amer: 55 mL/min — ABNORMAL LOW (ref >60)
GLUCOSE: 183 mg/dL — AB (ref 65–99)
POTASSIUM: 3.8 mmol/L (ref 3.5–5.1)
Sodium: 141 mmol/L (ref 135–145)

## 2016-07-22 LAB — TYPE AND SCREEN
ABO/RH(D): B NEG
Antibody Screen: NEGATIVE

## 2016-07-22 LAB — CBC
HEMATOCRIT: 37.9 % (ref 36.0–46.0)
Hemoglobin: 12.2 g/dL (ref 12.0–15.0)
MCH: 26.8 pg (ref 26.0–34.0)
MCHC: 32.2 g/dL (ref 30.0–36.0)
MCV: 83.3 fL (ref 78.0–100.0)
PLATELETS: 261 10*3/uL (ref 150–400)
RBC: 4.55 MIL/uL (ref 3.87–5.11)
RDW: 13.2 % (ref 11.5–15.5)
WBC: 9.4 10*3/uL (ref 4.0–10.5)

## 2016-07-22 LAB — SURGICAL PCR SCREEN
MRSA, PCR: NEGATIVE
STAPHYLOCOCCUS AUREUS: NEGATIVE

## 2016-07-22 LAB — GLUCOSE, CAPILLARY: Glucose-Capillary: 178 mg/dL — ABNORMAL HIGH (ref 65–99)

## 2016-07-22 NOTE — Progress Notes (Signed)
PCP: Alda Berthold (former PCP)  Dr. Cleotis Nipper manages Diabetes per pt. Pt states glucometer has been broken. Pt husband states he will get new meter to check CBGs prior to surgery.   No recent cardiac workup per pt husband. Pt formerly worked up by cardiologist for fast heart rate after stroke (heart rate always 100-120 per patient since stroke). Pt does not know who this cardiologist was Echo: 01/11/13 EKG: 07/22/16  No complaints of chest pain, SOB today from patient.

## 2016-07-23 LAB — HEMOGLOBIN A1C
Hgb A1c MFr Bld: 7.7 % — ABNORMAL HIGH (ref 4.8–5.6)
Mean Plasma Glucose: 174 mg/dL

## 2016-07-23 NOTE — Progress Notes (Addendum)
Anesthesia Chart Review:  Pt is a 65 year old female scheduled for L3-4, L4-5 decompression/fusion on 07/28/2016 with Leeroy Cha, MD.   - PCP is Minette Brine, NP, last office visit 03/18/16.  - Neurologist is Antony Contras, MD.   BP (!) 162/86   Pulse (!) 119   Temp 36.8 C   Resp 20   Ht 5\' 1"  (1.549 m)   Wt 181 lb (82.1 kg)   SpO2 100%   BMI 34.20 kg/m    PMH includes:   HTN, DM, hyperlipidemia, stroke, normal pressure hydrocephalus, GERD.  Former smoker. BMI 34  Medications include: amlodipine, benicar HCT, hydralazine, humulin 70/30, metformin, simvastatin  Preoperative labs reviewed.  HgbA1c 7.7, glucose 183.   EKG 07/22/16: Sinus tachycardia (113 bpm).  LAFB. Moderate voltage criteria for LVH, may be normal variant. Cannot rule out Septal infarct , age undetermined.   Echo 01/11/13:  - Left ventricle: No SAM of mitral valve. No LVOT gradient. The cavity size was normal. Wall thickness was increased in a pattern of moderate to severe LVH. Systolic function was vigorous. The estimated ejection fraction was in the range of 65% to 70%. Wall motion was normal; there were no regional wall motion abnormalities. - Mitral valve: Mildly calcified annulus. No significant regurgitation. - Systemic veins: IVC is not dilated.  Pt appears to have chronic tachycardia based on vital signs documented at prior encounters in Epic.  I spoke with pt by telephone.  She tells me her heart rate is always high.  Unfortunately, she cannot tell me if she's ever had any testing related to this- she says her memory isn't good since her stroke. Last office visit note from PCP's office makes no mention of a history of tachycardia, and HR was not documented at that visit.  EKG from PCP's office dated 09/25/15 showed sinus tachycardia at 110 bpm, as well as LAFB, LAE, voltage criteria for LVH and old anteroseptal infarct.   If pt's HR is stable DOS (her baseline seems to be 100's-110's), I anticipate pt can  proceed as scheduled.   Willeen Cass, FNP-BC [facility]esthesiology Phone: [phone] 07/27/2016 3:56 PM

## 2016-07-28 ENCOUNTER — Encounter (HOSPITAL_COMMUNITY)
Admission: RE | Disposition: A | Discharge status: Skilled Nursing Facility | Payer: Self-pay | Source: Ambulatory Visit | Attending: Neurosurgery

## 2016-07-28 ENCOUNTER — Inpatient Hospital Stay (HOSPITAL_COMMUNITY): Payer: Managed Care, Other (non HMO) | Admitting: Emergency Medicine

## 2016-07-28 ENCOUNTER — Inpatient Hospital Stay (HOSPITAL_COMMUNITY): Payer: Managed Care, Other (non HMO)

## 2016-07-28 ENCOUNTER — Inpatient Hospital Stay (HOSPITAL_COMMUNITY): Payer: Managed Care, Other (non HMO) | Admitting: Anesthesiology

## 2016-07-28 ENCOUNTER — Inpatient Hospital Stay (HOSPITAL_COMMUNITY)
Admission: RE | Admit: 2016-07-28 | Discharge: 2016-08-05 | DRG: 459 | Disposition: A | Discharge status: Skilled Nursing Facility | Payer: Managed Care, Other (non HMO) | Source: Ambulatory Visit | Attending: Neurosurgery | Admitting: Neurosurgery

## 2016-07-28 DIAGNOSIS — F0150 Vascular dementia without behavioral disturbance: Secondary | ICD-10-CM | POA: Diagnosis present

## 2016-07-28 DIAGNOSIS — K219 Gastro-esophageal reflux disease without esophagitis: Secondary | ICD-10-CM | POA: Diagnosis present

## 2016-07-28 DIAGNOSIS — Z880 Allergy status to penicillin: Secondary | ICD-10-CM

## 2016-07-28 DIAGNOSIS — Z419 Encounter for procedure for purposes other than remedying health state, unspecified: Secondary | ICD-10-CM | POA: Diagnosis not present

## 2016-07-28 DIAGNOSIS — Z7982 Long term (current) use of aspirin: Secondary | ICD-10-CM

## 2016-07-28 DIAGNOSIS — Z8673 Personal history of transient ischemic attack (TIA), and cerebral infarction without residual deficits: Secondary | ICD-10-CM

## 2016-07-28 DIAGNOSIS — G912 (Idiopathic) normal pressure hydrocephalus: Secondary | ICD-10-CM | POA: Diagnosis present

## 2016-07-28 DIAGNOSIS — M4316 Spondylolisthesis, lumbar region: Secondary | ICD-10-CM

## 2016-07-28 DIAGNOSIS — Z87891 Personal history of nicotine dependence: Secondary | ICD-10-CM

## 2016-07-28 DIAGNOSIS — M48061 Spinal stenosis, lumbar region without neurogenic claudication: Secondary | ICD-10-CM | POA: Diagnosis present

## 2016-07-28 DIAGNOSIS — Z794 Long term (current) use of insulin: Secondary | ICD-10-CM | POA: Diagnosis not present

## 2016-07-28 DIAGNOSIS — E785 Hyperlipidemia, unspecified: Secondary | ICD-10-CM | POA: Diagnosis present

## 2016-07-28 DIAGNOSIS — Z8249 Family history of ischemic heart disease and other diseases of the circulatory system: Secondary | ICD-10-CM | POA: Diagnosis not present

## 2016-07-28 DIAGNOSIS — I639 Cerebral infarction, unspecified: Secondary | ICD-10-CM | POA: Diagnosis not present

## 2016-07-28 DIAGNOSIS — I10 Essential (primary) hypertension: Secondary | ICD-10-CM | POA: Diagnosis present

## 2016-07-28 DIAGNOSIS — M419 Scoliosis, unspecified: Secondary | ICD-10-CM | POA: Diagnosis present

## 2016-07-28 DIAGNOSIS — M549 Dorsalgia, unspecified: Secondary | ICD-10-CM | POA: Diagnosis present

## 2016-07-28 DIAGNOSIS — M5116 Intervertebral disc disorders with radiculopathy, lumbar region: Secondary | ICD-10-CM | POA: Diagnosis present

## 2016-07-28 DIAGNOSIS — R4182 Altered mental status, unspecified: Secondary | ICD-10-CM

## 2016-07-28 DIAGNOSIS — M51369 Other intervertebral disc degeneration, lumbar region without mention of lumbar back pain or lower extremity pain: Secondary | ICD-10-CM

## 2016-07-28 DIAGNOSIS — M5136 Other intervertebral disc degeneration, lumbar region: Secondary | ICD-10-CM | POA: Diagnosis not present

## 2016-07-28 DIAGNOSIS — E119 Type 2 diabetes mellitus without complications: Secondary | ICD-10-CM | POA: Diagnosis present

## 2016-07-28 DIAGNOSIS — I6789 Other cerebrovascular disease: Secondary | ICD-10-CM | POA: Diagnosis not present

## 2016-07-28 DIAGNOSIS — R32 Unspecified urinary incontinence: Secondary | ICD-10-CM

## 2016-07-28 DIAGNOSIS — N179 Acute kidney failure, unspecified: Secondary | ICD-10-CM

## 2016-07-28 LAB — GLUCOSE, CAPILLARY
GLUCOSE-CAPILLARY: 129 mg/dL — AB (ref 65–99)
GLUCOSE-CAPILLARY: 210 mg/dL — AB (ref 65–99)
Glucose-Capillary: 125 mg/dL — ABNORMAL HIGH (ref 65–99)
Glucose-Capillary: 134 mg/dL — ABNORMAL HIGH (ref 65–99)

## 2016-07-28 SURGERY — POSTERIOR LUMBAR FUSION 2 LEVEL
Anesthesia: General

## 2016-07-28 MED ORDER — METHYLPREDNISOLONE ACETATE 80 MG/ML IJ SUSP
INTRAMUSCULAR | Status: AC
  Filled 2016-07-28: qty 1

## 2016-07-28 MED ORDER — IRBESARTAN 150 MG PO TABS
150.0000 mg | ORAL_TABLET | Freq: Every day | ORAL | Status: DC
  Administered 2016-07-29 – 2016-08-05 (×8): 150 mg via ORAL
  Filled 2016-07-28 (×9): qty 1

## 2016-07-28 MED ORDER — PHENYLEPHRINE 40 MCG/ML (10ML) SYRINGE FOR IV PUSH (FOR BLOOD PRESSURE SUPPORT)
PREFILLED_SYRINGE | INTRAVENOUS | Status: AC
  Filled 2016-07-28: qty 10

## 2016-07-28 MED ORDER — HYDROMORPHONE HCL 1 MG/ML IJ SOLN: 0.2500 mg | INTRAMUSCULAR | Status: DC | PRN

## 2016-07-28 MED ORDER — ACETAMINOPHEN 325 MG PO TABS
650.0000 mg | ORAL_TABLET | ORAL | Status: DC | PRN
  Administered 2016-07-29 – 2016-07-31 (×2): 650 mg via ORAL
  Filled 2016-07-28 (×3): qty 2

## 2016-07-28 MED ORDER — NALOXONE HCL 0.4 MG/ML IJ SOLN: 0.4000 mg | INTRAMUSCULAR | Status: DC | PRN

## 2016-07-28 MED ORDER — VANCOMYCIN HCL IN DEXTROSE 1-5 GM/200ML-% IV SOLN: 1000.0000 mg | Freq: Two times a day (BID) | INTRAVENOUS | Status: DC

## 2016-07-28 MED ORDER — EPHEDRINE 5 MG/ML INJ
INTRAVENOUS | Status: AC
  Filled 2016-07-28: qty 10

## 2016-07-28 MED ORDER — MIDAZOLAM HCL 2 MG/2ML IJ SOLN
INTRAMUSCULAR | Status: AC
  Filled 2016-07-28: qty 2

## 2016-07-28 MED ORDER — LIDOCAINE 2% (20 MG/ML) 5 ML SYRINGE
INTRAMUSCULAR | Status: AC
  Filled 2016-07-28: qty 5

## 2016-07-28 MED ORDER — THROMBIN 5000 UNITS EX SOLR
CUTANEOUS | Status: AC
  Filled 2016-07-28: qty 5000

## 2016-07-28 MED ORDER — SODIUM CHLORIDE 0.9% FLUSH: 9.0000 mL | INTRAVENOUS | Status: DC | PRN

## 2016-07-28 MED ORDER — ONDANSETRON HCL 4 MG/2ML IJ SOLN: 4.0000 mg | INTRAMUSCULAR | Status: DC | PRN

## 2016-07-28 MED ORDER — MIDAZOLAM HCL 5 MG/5ML IJ SOLN
INTRAMUSCULAR | Status: DC | PRN
  Administered 2016-07-28: 1 mg via INTRAVENOUS

## 2016-07-28 MED ORDER — METFORMIN HCL 500 MG PO TABS
1000.0000 mg | ORAL_TABLET | Freq: Two times a day (BID) | ORAL | Status: DC
  Administered 2016-07-28 – 2016-08-05 (×15): 1000 mg via ORAL
  Filled 2016-07-28 (×16): qty 2

## 2016-07-28 MED ORDER — ROCURONIUM BROMIDE 100 MG/10ML IV SOLN
INTRAVENOUS | Status: DC | PRN
  Administered 2016-07-28: 50 mg via INTRAVENOUS
  Administered 2016-07-28: 20 mg via INTRAVENOUS
  Administered 2016-07-28: 10 mg via INTRAVENOUS

## 2016-07-28 MED ORDER — PHENYLEPHRINE HCL 10 MG/ML IJ SOLN
INTRAMUSCULAR | Status: DC | PRN
  Administered 2016-07-28: 80 ug via INTRAVENOUS
  Administered 2016-07-28: 120 ug via INTRAVENOUS

## 2016-07-28 MED ORDER — VANCOMYCIN HCL 1000 MG IV SOLR: 1000.0000 mg | Freq: Two times a day (BID) | INTRAVENOUS | Status: DC

## 2016-07-28 MED ORDER — VANCOMYCIN HCL 10 G IV SOLR
1500.0000 mg | INTRAVENOUS | Status: DC
  Administered 2016-07-29 – 2016-07-30 (×3): 1500 mg via INTRAVENOUS
  Filled 2016-07-28 (×4): qty 1500

## 2016-07-28 MED ORDER — SIMVASTATIN 20 MG PO TABS
20.0000 mg | ORAL_TABLET | Freq: Every day | ORAL | Status: DC
  Administered 2016-07-28 – 2016-08-04 (×7): 20 mg via ORAL
  Filled 2016-07-28 (×8): qty 1

## 2016-07-28 MED ORDER — FENTANYL CITRATE (PF) 100 MCG/2ML IJ SOLN
INTRAMUSCULAR | Status: DC | PRN
  Administered 2016-07-28 (×2): 50 ug via INTRAVENOUS
  Administered 2016-07-28: 100 ug via INTRAVENOUS

## 2016-07-28 MED ORDER — THROMBIN 20000 UNITS EX SOLR
CUTANEOUS | Status: AC
  Filled 2016-07-28: qty 20000

## 2016-07-28 MED ORDER — SODIUM CHLORIDE 0.9 % IV SOLN
INTRAVENOUS | Status: DC
  Administered 2016-07-28 – 2016-07-29 (×2): via INTRAVENOUS

## 2016-07-28 MED ORDER — ONDANSETRON HCL 4 MG/2ML IJ SOLN
INTRAMUSCULAR | Status: DC | PRN
  Administered 2016-07-28 (×2): 4 mg via INTRAVENOUS

## 2016-07-28 MED ORDER — ARTIFICIAL TEARS OP OINT
TOPICAL_OINTMENT | OPHTHALMIC | Status: DC | PRN
  Administered 2016-07-28: 1 via OPHTHALMIC

## 2016-07-28 MED ORDER — PROPOFOL 10 MG/ML IV BOLUS
INTRAVENOUS | Status: AC
  Filled 2016-07-28: qty 20

## 2016-07-28 MED ORDER — VANCOMYCIN HCL 1000 MG IV SOLR
INTRAVENOUS | Status: DC | PRN
  Administered 2016-07-28: 1000 mg via INTRAVENOUS

## 2016-07-28 MED ORDER — OLMESARTAN MEDOXOMIL-HCTZ 20-12.5 MG PO TABS: 1.0000 | ORAL_TABLET | Freq: Every day | ORAL | Status: DC

## 2016-07-28 MED ORDER — BUPIVACAINE LIPOSOME 1.3 % IJ SUSP
20.0000 mL | INTRAMUSCULAR | Status: AC
  Administered 2016-07-28: 20 mL
  Filled 2016-07-28: qty 20

## 2016-07-28 MED ORDER — ONDANSETRON HCL 4 MG/2ML IJ SOLN: 4.0000 mg | Freq: Four times a day (QID) | INTRAMUSCULAR | Status: DC | PRN

## 2016-07-28 MED ORDER — INSULIN ASPART 100 UNIT/ML ~~LOC~~ SOLN
0.0000 [IU] | Freq: Every day | SUBCUTANEOUS | Status: DC
  Administered 2016-07-28: 2 [IU] via SUBCUTANEOUS

## 2016-07-28 MED ORDER — ACETAMINOPHEN 650 MG RE SUPP
650.0000 mg | RECTAL | Status: DC | PRN
  Administered 2016-07-31: 650 mg via RECTAL
  Filled 2016-07-28: qty 1

## 2016-07-28 MED ORDER — VANCOMYCIN HCL IN DEXTROSE 1-5 GM/200ML-% IV SOLN
INTRAVENOUS | Status: AC
  Filled 2016-07-28: qty 200

## 2016-07-28 MED ORDER — LACTATED RINGERS IV SOLN
INTRAVENOUS | Status: DC
  Administered 2016-07-28 (×3): via INTRAVENOUS
  Administered 2016-07-28: 50 mL/h via INTRAVENOUS

## 2016-07-28 MED ORDER — 0.9 % SODIUM CHLORIDE (POUR BTL) OPTIME
TOPICAL | Status: DC | PRN
  Administered 2016-07-28: 1000 mL

## 2016-07-28 MED ORDER — INSULIN ASPART 100 UNIT/ML ~~LOC~~ SOLN
0.0000 [IU] | Freq: Three times a day (TID) | SUBCUTANEOUS | Status: DC
  Administered 2016-07-29 – 2016-07-30 (×5): 3 [IU] via SUBCUTANEOUS
  Administered 2016-07-30: 2 [IU] via SUBCUTANEOUS
  Administered 2016-07-31: 3 [IU] via SUBCUTANEOUS
  Administered 2016-07-31: 5 [IU] via SUBCUTANEOUS
  Administered 2016-07-31 – 2016-08-01 (×2): 3 [IU] via SUBCUTANEOUS
  Administered 2016-08-01: 2 [IU] via SUBCUTANEOUS
  Administered 2016-08-01: 5 [IU] via SUBCUTANEOUS
  Administered 2016-08-02: 3 [IU] via SUBCUTANEOUS
  Administered 2016-08-02 (×2): 2 [IU] via SUBCUTANEOUS
  Administered 2016-08-03 (×2): 5 [IU] via SUBCUTANEOUS
  Administered 2016-08-03 – 2016-08-04 (×2): 2 [IU] via SUBCUTANEOUS
  Administered 2016-08-04: 3 [IU] via SUBCUTANEOUS
  Administered 2016-08-04: 2 [IU] via SUBCUTANEOUS
  Administered 2016-08-05: 3 [IU] via SUBCUTANEOUS

## 2016-07-28 MED ORDER — FENTANYL CITRATE (PF) 100 MCG/2ML IJ SOLN
INTRAMUSCULAR | Status: AC
  Filled 2016-07-28: qty 4

## 2016-07-28 MED ORDER — OXYCODONE-ACETAMINOPHEN 5-325 MG PO TABS: 1.0000 | ORAL_TABLET | ORAL | Status: DC | PRN

## 2016-07-28 MED ORDER — THROMBIN 20000 UNITS EX SOLR
CUTANEOUS | Status: DC | PRN
  Administered 2016-07-28: 13:00:00 via TOPICAL

## 2016-07-28 MED ORDER — ROCURONIUM BROMIDE 10 MG/ML (PF) SYRINGE
PREFILLED_SYRINGE | INTRAVENOUS | Status: AC
  Filled 2016-07-28: qty 10

## 2016-07-28 MED ORDER — THROMBIN 5000 UNITS EX SOLR
OROMUCOSAL | Status: DC | PRN
  Administered 2016-07-28: 13:00:00 via TOPICAL

## 2016-07-28 MED ORDER — VANCOMYCIN HCL 1000 MG IV SOLR
INTRAVENOUS | Status: AC
  Filled 2016-07-28: qty 1000

## 2016-07-28 MED ORDER — ALBUMIN HUMAN 5 % IV SOLN
12.5000 g | Freq: Once | INTRAVENOUS | Status: AC
  Administered 2016-07-28: 12.5 g via INTRAVENOUS

## 2016-07-28 MED ORDER — PHENOL 1.4 % MT LIQD: 1.0000 | OROMUCOSAL | Status: DC | PRN

## 2016-07-28 MED ORDER — PROPOFOL 10 MG/ML IV BOLUS
INTRAVENOUS | Status: DC | PRN
  Administered 2016-07-28: 180 mg via INTRAVENOUS

## 2016-07-28 MED ORDER — LIDOCAINE HCL (CARDIAC) 20 MG/ML IV SOLN
INTRAVENOUS | Status: DC | PRN
  Administered 2016-07-28: 100 mg via INTRAVENOUS

## 2016-07-28 MED ORDER — PHENYLEPHRINE HCL 10 MG/ML IJ SOLN
INTRAVENOUS | Status: DC | PRN
  Administered 2016-07-28: 20 ug/min via INTRAVENOUS

## 2016-07-28 MED ORDER — DIPHENHYDRAMINE HCL 50 MG/ML IJ SOLN: 12.5000 mg | Freq: Four times a day (QID) | INTRAMUSCULAR | Status: DC | PRN

## 2016-07-28 MED ORDER — EPHEDRINE SULFATE 50 MG/ML IJ SOLN
INTRAMUSCULAR | Status: DC | PRN
  Administered 2016-07-28: 10 mg via INTRAVENOUS

## 2016-07-28 MED ORDER — VANCOMYCIN HCL 1000 MG IV SOLR
INTRAVENOUS | Status: DC | PRN
  Administered 2016-07-28: 1000 mg

## 2016-07-28 MED ORDER — DIPHENHYDRAMINE HCL 12.5 MG/5ML PO ELIX: 12.5000 mg | ORAL_SOLUTION | Freq: Four times a day (QID) | ORAL | Status: DC | PRN

## 2016-07-28 MED ORDER — MENTHOL 3 MG MT LOZG: 1.0000 | LOZENGE | OROMUCOSAL | Status: DC | PRN

## 2016-07-28 MED ORDER — ALBUMIN HUMAN 5 % IV SOLN
INTRAVENOUS | Status: AC
  Filled 2016-07-28: qty 250

## 2016-07-28 MED ORDER — SODIUM CHLORIDE 0.9% FLUSH: 3.0000 mL | INTRAVENOUS | Status: DC | PRN

## 2016-07-28 MED ORDER — HYDRALAZINE HCL 25 MG PO TABS
25.0000 mg | ORAL_TABLET | Freq: Two times a day (BID) | ORAL | Status: DC
  Administered 2016-07-28 – 2016-08-05 (×16): 25 mg via ORAL
  Filled 2016-07-28 (×16): qty 1

## 2016-07-28 MED ORDER — SODIUM CHLORIDE 0.9 % IV SOLN: 250.0000 mL | INTRAVENOUS | Status: DC

## 2016-07-28 MED ORDER — CYCLOBENZAPRINE HCL 10 MG PO TABS: 10.0000 mg | ORAL_TABLET | Freq: Three times a day (TID) | ORAL | Status: DC | PRN

## 2016-07-28 MED ORDER — MORPHINE SULFATE 2 MG/ML IV SOLN
INTRAVENOUS | Status: DC
  Administered 2016-07-28: 16:00:00 via INTRAVENOUS

## 2016-07-28 MED ORDER — SODIUM CHLORIDE 0.9% FLUSH
3.0000 mL | Freq: Two times a day (BID) | INTRAVENOUS | Status: DC
  Administered 2016-07-28 – 2016-08-04 (×13): 3 mL via INTRAVENOUS

## 2016-07-28 MED ORDER — ASPIRIN 81 MG PO CHEW
81.0000 mg | CHEWABLE_TABLET | Freq: Every day | ORAL | Status: DC
  Administered 2016-07-29 – 2016-07-30 (×2): 81 mg via ORAL
  Filled 2016-07-28 (×3): qty 1

## 2016-07-28 MED ORDER — ALBUMIN HUMAN 5 % IV SOLN
INTRAVENOUS | Status: DC | PRN
  Administered 2016-07-28: 15:00:00 via INTRAVENOUS

## 2016-07-28 MED ORDER — HYDROCHLOROTHIAZIDE 12.5 MG PO CAPS
12.5000 mg | ORAL_CAPSULE | Freq: Every day | ORAL | Status: DC
  Administered 2016-07-29 – 2016-08-05 (×8): 12.5 mg via ORAL
  Filled 2016-07-28 (×9): qty 1

## 2016-07-28 MED ORDER — AMLODIPINE BESYLATE 10 MG PO TABS
10.0000 mg | ORAL_TABLET | Freq: Every day | ORAL | Status: DC
  Administered 2016-07-29 – 2016-08-05 (×8): 10 mg via ORAL
  Filled 2016-07-28 (×9): qty 1

## 2016-07-28 MED ORDER — GABAPENTIN 100 MG PO CAPS
100.0000 mg | ORAL_CAPSULE | Freq: Three times a day (TID) | ORAL | Status: DC
  Administered 2016-07-28 – 2016-07-29 (×2): 100 mg via ORAL
  Filled 2016-07-28 (×2): qty 1

## 2016-07-28 MED ORDER — MORPHINE SULFATE 2 MG/ML IV SOLN
INTRAVENOUS | Status: AC
  Filled 2016-07-28: qty 25

## 2016-07-28 SURGICAL SUPPLY — 77 items
APL SKNCLS STERI-STRIP NONHPOA (GAUZE/BANDAGES/DRESSINGS) ×1
BENZOIN TINCTURE PRP APPL 2/3 (GAUZE/BANDAGES/DRESSINGS) ×3 IMPLANT
BLADE CLIPPER SURG (BLADE) IMPLANT
BUR ACORN 6.0 (BURR) ×2 IMPLANT
BUR ACORN 6.0MM (BURR) ×1
BUR MATCHSTICK NEURO 3.0 LAGG (BURR) ×3 IMPLANT
CANISTER SUCT 3000ML PPV (MISCELLANEOUS) ×3 IMPLANT
CAP LOCKING THREADED (Cap) ×12 IMPLANT
CLOSURE WOUND 1/2 X4 (GAUZE/BANDAGES/DRESSINGS) ×1
CONT SPEC 4OZ CLIKSEAL STRL BL (MISCELLANEOUS) ×5 IMPLANT
COVER BACK TABLE 60X90IN (DRAPES) ×3 IMPLANT
DRAPE C-ARM 42X72 X-RAY (DRAPES) ×6 IMPLANT
DRAPE LAPAROTOMY 100X72X124 (DRAPES) ×3 IMPLANT
DRAPE POUCH INSTRU U-SHP 10X18 (DRAPES) ×3 IMPLANT
DRAPE PROXIMA HALF (DRAPES) ×4 IMPLANT
DRSG OPSITE POSTOP 4X8 (GAUZE/BANDAGES/DRESSINGS) ×2 IMPLANT
DRSG PAD ABDOMINAL 8X10 ST (GAUZE/BANDAGES/DRESSINGS) IMPLANT
DURAPREP 26ML APPLICATOR (WOUND CARE) ×3 IMPLANT
ELECT REM PT RETURN 9FT ADLT (ELECTROSURGICAL) ×3
ELECTRODE REM PT RTRN 9FT ADLT (ELECTROSURGICAL) ×1 IMPLANT
EVACUATOR 1/8 PVC DRAIN (DRAIN) IMPLANT
EVACUATOR 3/16  PVC DRAIN (DRAIN) ×2
EVACUATOR 3/16 PVC DRAIN (DRAIN) IMPLANT
GAUZE SPONGE 4X4 12PLY STRL (GAUZE/BANDAGES/DRESSINGS) ×3 IMPLANT
GAUZE SPONGE 4X4 16PLY XRAY LF (GAUZE/BANDAGES/DRESSINGS) ×5 IMPLANT
GLOVE BIO SURGEON STRL SZ 6.5 (GLOVE) ×1 IMPLANT
GLOVE BIO SURGEONS STRL SZ 6.5 (GLOVE) ×1
GLOVE BIOGEL M 8.0 STRL (GLOVE) ×3 IMPLANT
GLOVE BIOGEL PI IND STRL 7.5 (GLOVE) IMPLANT
GLOVE BIOGEL PI INDICATOR 7.5 (GLOVE) ×2
GLOVE EXAM NITRILE LRG STRL (GLOVE) IMPLANT
GLOVE EXAM NITRILE XL STR (GLOVE) IMPLANT
GLOVE EXAM NITRILE XS STR PU (GLOVE) IMPLANT
GLOVE SS BIOGEL STRL SZ 7 (GLOVE) IMPLANT
GLOVE SUPERSENSE BIOGEL SZ 7 (GLOVE) ×2
GOWN STRL REUS W/ TWL LRG LVL3 (GOWN DISPOSABLE) ×1 IMPLANT
GOWN STRL REUS W/ TWL XL LVL3 (GOWN DISPOSABLE) IMPLANT
GOWN STRL REUS W/TWL 2XL LVL3 (GOWN DISPOSABLE) IMPLANT
GOWN STRL REUS W/TWL LRG LVL3 (GOWN DISPOSABLE) ×3
GOWN STRL REUS W/TWL XL LVL3 (GOWN DISPOSABLE)
HEMOSTAT POWDER KIT SURGIFOAM (HEMOSTASIS) ×2 IMPLANT
KIT BASIN OR (CUSTOM PROCEDURE TRAY) ×3 IMPLANT
KIT INFUSE MEDIUM (Orthopedic Implant) ×2 IMPLANT
KIT ROOM TURNOVER OR (KITS) ×3 IMPLANT
MILL MEDIUM DISP (BLADE) ×2 IMPLANT
NDL HYPO 18GX1.5 BLUNT FILL (NEEDLE) IMPLANT
NDL HYPO 21X1.5 SAFETY (NEEDLE) IMPLANT
NDL HYPO 25X1 1.5 SAFETY (NEEDLE) IMPLANT
NEEDLE HYPO 18GX1.5 BLUNT FILL (NEEDLE) IMPLANT
NEEDLE HYPO 21X1.5 SAFETY (NEEDLE) ×3 IMPLANT
NEEDLE HYPO 25X1 1.5 SAFETY (NEEDLE) ×3 IMPLANT
NS IRRIG 1000ML POUR BTL (IV SOLUTION) ×3 IMPLANT
PACK LAMINECTOMY NEURO (CUSTOM PROCEDURE TRAY) ×5 IMPLANT
PAD ARMBOARD 7.5X6 YLW CONV (MISCELLANEOUS) ×9 IMPLANT
PATTIES SURGICAL .5 X1 (DISPOSABLE) ×1 IMPLANT
PATTIES SURGICAL .5 X3 (DISPOSABLE) IMPLANT
PATTIES SURGICAL 1X1 (DISPOSABLE) ×2 IMPLANT
ROD 70MM SPINAL (Rod) ×2 IMPLANT
ROD 75MM SPINAL (Rod) ×2 IMPLANT
SCREW SPINE 40X5.5XPA CREO (Screw) IMPLANT
SCREW SPINE CREO 5.5X40 (Screw) ×18 IMPLANT
SPACER RISE 8X22 8-14MM-10 (Neuro Prosthesis/Implant) ×4 IMPLANT
SPONGE LAP 4X18 X RAY DECT (DISPOSABLE) IMPLANT
SPONGE NEURO XRAY DETECT 1X3 (DISPOSABLE) IMPLANT
SPONGE SURGIFOAM ABS GEL 100 (HEMOSTASIS) ×3 IMPLANT
STAPLER SKIN PROX WIDE 3.9 (STAPLE) ×2 IMPLANT
STRIP CLOSURE SKIN 1/2X4 (GAUZE/BANDAGES/DRESSINGS) ×2 IMPLANT
SUT VIC AB 1 CT1 18XBRD ANBCTR (SUTURE) ×2 IMPLANT
SUT VIC AB 1 CT1 8-18 (SUTURE) ×6
SUT VIC AB 2-0 CP2 18 (SUTURE) ×3 IMPLANT
SUT VIC AB 3-0 SH 8-18 (SUTURE) ×3 IMPLANT
SYR 20CC LL (SYRINGE) ×2 IMPLANT
SYR 5ML LL (SYRINGE) IMPLANT
TOWEL OR 17X24 6PK STRL BLUE (TOWEL DISPOSABLE) ×3 IMPLANT
TOWEL OR 17X26 10 PK STRL BLUE (TOWEL DISPOSABLE) ×3 IMPLANT
TRAY FOLEY W/METER SILVER 16FR (SET/KITS/TRAYS/PACK) ×3 IMPLANT
WATER STERILE IRR 1000ML POUR (IV SOLUTION) ×3 IMPLANT

## 2016-07-28 NOTE — H&P (Signed)
NAMELENER, GOMER NO.:  [id number]  MEDICAL RECORD NO.:  [id number]  LOCATION:                                 FACILITY:  PHYSICIAN:  Leeroy Cha, M.D.   DATE OF BIRTH:  Jun 08, 1951  DATE OF ADMISSION:  07/28/2016 DATE OF DISCHARGE:                             HISTORY & PHYSICAL   HISTORY OF PRESENT ILLNESS:  Ms. Eaton is a lady who came to my office a month ago in a wheelchair because of back pain radiation to both legs especially when she walks.  The pain radiates to both legs.  About 2 years ago, she had a stroke and she was found to have some hydrocephalus.  Because of persistence of the pain, she sent to [place] for further evaluation.  The patient had an MRI.  REVIEW OF SYSTEMS:  Positive for back pain, leg tremors, difficulty with balance.  She never had surgery before.  ALLERGIES:  She is allergic to PENICILLIN.  MEDICATIONS:  She is taking metformin, simvastatin, and hydralazine.  FAMILY HISTORY:  Unremarkable.  SOCIAL HISTORY:  Negative.  PHYSICAL EXAMINATION:  GENERAL:  The patient came to my office in wheelchair.  She had difficulty standing and when she stands, she sat immediately. HEAD, EAR, NOSE AND THROAT:  Normal. NECK:  Normal. LUNGS:  There are some rhonchi bilaterally. CARDIOVASCULAR:  Normal. ABDOMEN:  Normal. EXTREMITIES:  Normal. NEURO:  She is oriented x3.  The cranial nerves normal.  Strength; she had weakness of both quadriceps.  Sensation normal.  Coordination normal.  The lumbar spine x-ray shows some degenerative disk disease at the level of 3-4 with spondylolisthesis.  The MRI showed that she has severe stenosis at the level of 3-4 with spondylolisthesis and herniated disk at the level of 4-5 going to the right side.  She has an old MRI of the brain, which showed hydrocephalus and probably old stroke.  CLINICAL IMPRESSION:  Lumbar stenosis with spondylolisthesis.  Lumbar stenosis at the level of  4-5.  RECOMMENDATION:  I talked to her and her husband.  We talked about what to do.  There is no question that she is going to need decompression at the level of L3-L4 with fusion and during surgery, we are going to take a look at the level of L4-5 with a laminotomy and to make a decision about the need to involve this later level with fusion. She and her husband knew the risk with the surgery including the possibility of no improvement whatsoever, infection, CSF leak, need for further surgery and all the risks associated with her age.  Also, they are fully aware that her problem is not corrected her hydrocephalus.    ______________________________ Leeroy Cha, M.D.   ______________________________ Leeroy Cha, M.D.    EB/MEDQ  D:  07/27/2016  T:  07/28/2016  Job:  [id number]

## 2016-07-28 NOTE — Anesthesia Procedure Notes (Signed)
Procedure Name: Intubation Date/Time: 07/28/2016 12:07 PM Performed by: Mariea Clonts Pre-anesthesia Checklist: Patient identified, Emergency Drugs available, Suction available and Patient being monitored Patient Re-evaluated:Patient Re-evaluated prior to inductionOxygen Delivery Method: Circle System Utilized Preoxygenation: Pre-oxygenation with 100% oxygen Intubation Type: IV induction Ventilation: Mask ventilation without difficulty Laryngoscope Size: Miller and 2 Grade View: Grade I Tube type: Oral Number of attempts: 1 Airway Equipment and Method: Stylet and Oral airway Placement Confirmation: ETT inserted through vocal cords under direct vision,  positive ETCO2 and breath sounds checked- equal and bilateral Tube secured with: Tape Dental Injury: Teeth and Oropharynx as per pre-operative assessment

## 2016-07-28 NOTE — Progress Notes (Signed)
Pharmacy Antibiotic Note  Dawn [name] is a 65 y.o. female s/p spinal surgery to begin vancomycin for surgical prophylaxis (a drain is in place) -SCr= 1.05 on 10/4; CrCl ~ 50 -vancomycin 1gm given at ~ 12pm today  Plan: -Vancomycin 1500mg  IV q24hr for a goal trough of 10-15  -Will follow plans for length of therapy   Height: 5' (152.4 cm) Weight: 181 lb (82.1 kg) IBW/kg (Calculated) : 45.5  Temp (24hrs), Avg:98 F (36.7 C), Min:97.3 F (36.3 C), Max:98.2 F (36.8 C)   Recent Labs Lab 07/22/16 1351  WBC 9.4  CREATININE 1.05*    Estimated Creatinine Clearance: 52 mL/min (by C-G formula based on SCr of 1.05 mg/dL (H)).    Allergies  Allergen Reactions  . Penicillins Swelling    Has patient had a PCN reaction causing immediate rash, facial/tongue/throat swelling, SOB or lightheadedness with hypotension: Yes Has patient had a PCN reaction causing severe rash involving mucus membranes or skin necrosis: No Has patient had a PCN reaction that required hospitalization No Has patient had a PCN reaction occurring within the last 10 years: No If all of the above answers are "NO", then may proceed with Cephalosporin use.     Antimicrobials this admission: 10/10 vanc  Dose adjustments this admission:   Microbiology results: none  Thank you for allowing pharmacy to be a part of this patient's care.  Dareen Piano 07/28/2016 7:22 PM

## 2016-07-28 NOTE — Transfer of Care (Signed)
Immediate Anesthesia Transfer of Care Note  Patient: Dawn [name]  Procedure(s) Performed: Procedure(s) with comments: Lumbar three- four, Lumbar four- five Decompression/Fusion with Cage and screws (N/A) - L3-4 L4-5 Decompression/Fusion with Cage and screws  Patient Location: PACU  Anesthesia Type:General  Level of Consciousness: awake, alert  and oriented  Airway & Oxygen Therapy: Patient Spontanous Breathing and Patient connected to nasal cannula oxygen  Post-op Assessment: Report given to RN, Post -op Vital signs reviewed and stable and Patient moving all extremities X 4  Post vital signs: Reviewed and stable  Last Vitals:  Vitals:   07/28/16 0845 07/28/16 1600  BP: (!) 173/84 (!) 90/59  Pulse: (!) 110   Resp: 20 17  Temp: 36.8 C 36.3 C    Last Pain:  Vitals:   07/28/16 1600  TempSrc:   PainSc: Asleep      Patients Stated Pain Goal: 4 ([id number])  Complications: No apparent anesthesia complications

## 2016-07-28 NOTE — Anesthesia Preprocedure Evaluation (Addendum)
Anesthesia Evaluation  Patient identified by MRN, date of birth, ID band Patient awake    Reviewed: Allergy & Precautions, NPO status , Patient's Chart, lab work & pertinent test results  Airway Mallampati: II  TM Distance: >3 FB     Dental   Pulmonary shortness of breath, former smoker,    breath sounds clear to auscultation       Cardiovascular hypertension,  Rhythm:Regular Rate:Normal     Neuro/Psych    GI/Hepatic Neg liver ROS, GERD  ,  Endo/Other  diabetes  Renal/GU Renal disease     Musculoskeletal  (+) Arthritis ,   Abdominal   Peds  Hematology  (+) anemia ,   Anesthesia Other Findings   Reproductive/Obstetrics                             Anesthesia Physical Anesthesia Plan  ASA: III  Anesthesia Plan: General   Post-op Pain Management:    Induction: Intravenous  Airway Management Planned: Oral ETT  Additional Equipment:   Intra-op Plan:   Post-operative Plan: Possible Post-op intubation/ventilation  Informed Consent:   Dental advisory given  Plan Discussed with: CRNA and Anesthesiologist  Anesthesia Plan Comments:         Anesthesia Quick Evaluation

## 2016-07-29 DIAGNOSIS — N39498 Other specified urinary incontinence: Secondary | ICD-10-CM

## 2016-07-29 DIAGNOSIS — M5136 Other intervertebral disc degeneration, lumbar region: Secondary | ICD-10-CM

## 2016-07-29 DIAGNOSIS — G912 (Idiopathic) normal pressure hydrocephalus: Secondary | ICD-10-CM

## 2016-07-29 DIAGNOSIS — M4316 Spondylolisthesis, lumbar region: Secondary | ICD-10-CM

## 2016-07-29 DIAGNOSIS — E119 Type 2 diabetes mellitus without complications: Secondary | ICD-10-CM

## 2016-07-29 DIAGNOSIS — R32 Unspecified urinary incontinence: Secondary | ICD-10-CM

## 2016-07-29 DIAGNOSIS — N179 Acute kidney failure, unspecified: Secondary | ICD-10-CM

## 2016-07-29 DIAGNOSIS — Z419 Encounter for procedure for purposes other than remedying health state, unspecified: Secondary | ICD-10-CM

## 2016-07-29 DIAGNOSIS — Z8673 Personal history of transient ischemic attack (TIA), and cerebral infarction without residual deficits: Secondary | ICD-10-CM

## 2016-07-29 DIAGNOSIS — F0150 Vascular dementia without behavioral disturbance: Secondary | ICD-10-CM

## 2016-07-29 LAB — GLUCOSE, CAPILLARY
GLUCOSE-CAPILLARY: 182 mg/dL — AB (ref 65–99)
GLUCOSE-CAPILLARY: 167 mg/dL — AB (ref 65–99)
GLUCOSE-CAPILLARY: 171 mg/dL — AB (ref 65–99)
Glucose-Capillary: 168 mg/dL — ABNORMAL HIGH (ref 65–99)

## 2016-07-29 MED ORDER — ORAL CARE MOUTH RINSE
15.0000 mL | Freq: Two times a day (BID) | OROMUCOSAL | Status: DC
  Administered 2016-07-30 – 2016-08-05 (×9): 15 mL via OROMUCOSAL

## 2016-07-29 MED ORDER — HYDROCODONE-ACETAMINOPHEN 10-325 MG PO TABS
1.0000 | ORAL_TABLET | ORAL | Status: DC | PRN
  Administered 2016-07-30 – 2016-08-05 (×8): 1 via ORAL
  Filled 2016-07-29 (×9): qty 1

## 2016-07-29 MED ORDER — CHLORHEXIDINE GLUCONATE 0.12 % MT SOLN
15.0000 mL | Freq: Two times a day (BID) | OROMUCOSAL | Status: DC
  Administered 2016-07-29 – 2016-08-05 (×14): 15 mL via OROMUCOSAL
  Filled 2016-07-29 (×14): qty 15

## 2016-07-29 NOTE — Progress Notes (Signed)
Informed MD Botero of concern with patient's behavior. Pt has trouble focusing, needs multiple prompts to perform tasks, and seems to focus her eyes upward and to the left when people are talking to her. MD Botero listened to concern and stated will alter meds. Will continue to follow up with pt.

## 2016-07-29 NOTE — Progress Notes (Signed)
Patient ID: Dawn [name], female   DOB: [birth year], 65 y.o.   MRN: [id number] Stable, complains of incisional pain no leg pain. Plan to get a consult with rehab medicine

## 2016-07-29 NOTE — Evaluation (Addendum)
Occupational Therapy Evaluation Patient Details Name: Dawn [name] MRN: [id number] DOB: Jan 14, 1951 Today's Date: 07/29/2016    History of Present Illness pt is a 65 y/o female with h/o of stroke, NPH, HTN, scoliosis, admitted with bilateral radicular pain, s/p L34 lami, facetectomy and discectomies with PLA and autograft.   Clinical Impression   Unsure of pts PLOF PTA; no family/caregiver present to confirm information provided by pt. Currently pt requires max assist for all ADL and max assist +2 for stand pivot transfer. Pt with strong head turn to L side but able to scan to R with max cues, poor attention to task, and difficulty recalling PLOF information; unsure of pts baseline as no family is present to confirm. Began back, safety, and ADL education; unsure of pts retention of information at this time. Recommending SNF for follow up to maximize independence and safety with ADL and functional mobility prior to return home. Pt would benefit from continued skilled OT to address established goals.    Follow Up Recommendations  SNF;Supervision/Assistance - 24 hour    Equipment Recommendations  Other (comment) (TBD at next venue)    Recommendations for Other Services       Precautions / Restrictions Precautions Precautions: Back;Fall Required Braces or Orthoses: Spinal Brace Spinal Brace: Lumbar corset;Applied in sitting position Restrictions Weight Bearing Restrictions: No      Mobility Bed Mobility Overal bed mobility: Needs Assistance;+2 for physical assistance Bed Mobility: Rolling;Sidelying to Sit Rolling: Max assist;+2 for physical assistance Sidelying to sit: Max assist;+2 for physical assistance       General bed mobility comments: cues for guarding  Transfers Overall transfer level: Needs assistance   Transfers: Sit to/from Stand;Stand Pivot Transfers Sit to Stand: Max assist;+2 physical assistance Stand pivot transfers: Max assist;+2 physical assistance        General transfer comment: pt having difficulty maintaining focus on task, not able to process direction within task.  Pt needing significant lifting assist and stability while pt attempting steps.    Balance Overall balance assessment: Needs assistance Sitting-balance support: Bilateral upper extremity supported Sitting balance-Leahy Scale: Poor Sitting balance - Comments: needed minimal support   Standing balance support: Bilateral upper extremity supported Standing balance-Leahy Scale: Zero Standing balance comment: pt unable to attain full upright positioning.  Reliant on maximal assist of 2                            ADL Overall ADL's : Needs assistance/impaired                                       General ADL Comments: pt currently max assist for all ADL. Required max +2 for stand pivot transfer. Supplemental O2 was off upon OT arrival; SpO2=61% on RA. Applied 3L O2 with rise in SpO2 to low 90s prior to activity.     Vision Additional Comments: Pt presenting with head turned toward L side. Able to scan to R with verbal cues. Pt able to identify items on breakfast tray when cued.   Perception     Praxis      Pertinent Vitals/Pain Pain Assessment: Faces Faces Pain Scale: Hurts little more Pain Location: back Pain Descriptors / Indicators: Grimacing;Operative site guarding Pain Intervention(s): Monitored during session;Repositioned;PCA encouraged     Hand Dominance     Extremity/Trunk Assessment Upper Extremity Assessment Upper Extremity Assessment:  Generalized weakness (R>L)   Lower Extremity Assessment Lower Extremity Assessment: Defer to PT evaluation   Cervical / Trunk Assessment Cervical / Trunk Assessment: Other exceptions Cervical / Trunk Exceptions: s/p spinal sx   Communication Communication Communication: No difficulties   Cognition Arousal/Alertness: Awake/alert Behavior During Therapy: Flat affect Overall Cognitive  Status: History of cognitive impairments - at baseline                     General Comments       Exercises       Shoulder Instructions      Home Living Family/patient expects to be discharged to:: Private residence Living Arrangements: Spouse/significant other;Other relatives Available Help at Discharge: Family;Other (Comment) (not sure have much family is available) Type of Home: House       Home Layout: One level               Home Equipment: Wheelchair - manual   Additional Comments: Pt is a poor historian; could not identify bathroom set up information.      Prior Functioning/Environment Level of Independence:  (unsure of PLOF)                 OT Problem List: Decreased strength;Decreased range of motion;Decreased activity tolerance;Impaired balance (sitting and/or standing);Impaired vision/perception;Decreased coordination;Decreased cognition;Decreased safety awareness;Decreased knowledge of use of DME or AE;Decreased knowledge of precautions;Obesity;Impaired UE functional use;Pain   OT Treatment/Interventions: Self-care/ADL training;Energy conservation;DME and/or AE instruction;Therapeutic exercise;Therapeutic activities;Patient/family education;Balance training    OT Goals(Current goals can be found in the care plan section) Acute Rehab OT Goals Patient Stated Goal: I want to get better. OT Goal Formulation: With patient Time For Goal Achievement: 08/12/16 Potential to Achieve Goals: Good ADL Goals Pt Will Perform Grooming: with supervision;sitting Pt Will Transfer to Toilet: with min assist;stand pivot transfer;bedside commode Additional ADL Goal #1: Pt will verbally recall 3/3 back precautions and maintain throughout ADL with min verbal cues. Additional ADL Goal #2: Pt will don/doff back brace with min assist as precursor to ADL and functional mobility.  OT Frequency: Min 2X/week   Barriers to D/C:            Co-evaluation PT/OT/SLP  Co-Evaluation/Treatment: Yes Reason for Co-Treatment: Complexity of the patient's impairments (multi-system involvement);For patient/therapist safety PT goals addressed during session: Mobility/safety with mobility OT goals addressed during session: ADL's and self-care      End of Session Equipment Utilized During Treatment: Back brace;Rolling walker;Oxygen Nurse Communication: Mobility status;Other (comment) (SpO2)  Activity Tolerance: Patient tolerated treatment well Patient left: in chair;with call bell/phone within reach;with chair alarm set   Time: [id number] OT Time Calculation (min): 25 min Charges:  OT General Charges $OT Visit: 1 Procedure OT Evaluation $OT Eval Moderate Complexity: 1 Procedure G-Codes:     Binnie Kand M.S., OTR/L Pager: [phone]  07/29/2016, 2:11 PM

## 2016-07-29 NOTE — Op Note (Signed)
NAMClarisa Schools:  Ravenscroft, Santina                ACCOUNT NO.:  [id number]  MEDICAL RECORD NO.:  [id number]  LOCATION:  5C16C                        FACILITY:  [facility]  PHYSICIAN:  Hilda LiasErnesto Wesson Stith, M.D.   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  POSTOPERATIVE DIAGNOSES:  Lumbar stenosis, L3-4, L4-5 with stenosis, spondylolisthesis, chronic radiculopathy.  PROCEDURES:  Bilateral L3 and L4 laminectomy.  Bilateral L3 and L4 facetectomy.  Bilateral L3-L4 and L4-L5 diskectomy more than begun to introduce the cages in the disk space.  Pedicle screws at L3, L4, L5. Posterolateral arthrodesis with BMP and autograft.  Cell Saver.  C-arm.  SURGEON:  Hilda LiasErnesto Tidus Upchurch, M.D.  ASSISTANT:  Dr. Bevely Palmeritty.  CLINICAL HISTORY:  The patient was seen in my office complaining of back pain radiation to the both legs.  Clinically, she has weakness of the quadriceps.  X-rays and MRI shows spondylolisthesis at the level of L3- L4 with severe stenosis at 4-5 with a large herniated disk.  Her sister had same procedure in the past.  The patient declined conservative treatment because she was getting worse.  Surgery was advised, and she and her husband knew the risk and benefits.  DESCRIPTION OF PROCEDURE:  The patient was taken to the OR.  After intubation, she was positioned in a prone manner.  The back was cleaned with DuraPrep and drapes were applied.  Midline incision from L2-L3 down to L4-5 was made.  X-rays showed that indeed we were right at the level of L3-L4.  From then on, we proceeded with removal of spinous process of L3 and L4 as well as the facet.  The patient had quite a bit of adhesion.  Lysis was accomplished.  We retracted the thecal sac at the level of 3-4 first on the right side and then in the left side, and  we entered quite degenerative disks.  Bilateral total diskectomy was achieved.  The same procedure was done at the level of L4-5 with decompression of the thecal sac and L5 nerve root.  Because the size of the canal, we were able to introduce one cage at the level of L3-4 and L4-5 in the midline.  The cage had inside, BMP and autograft.  The rest of the disk space was filled up with the same material.  Then, using the C-arm first in AP view and in lateral view, we introduced six screws at the level of L3, L4 and L5.  The screws were 5.5 x 40.  Prior to introduce of the screws, we feel all four quadrants just to be sure that we were surrounded by bone.  The screws were hold in place using two rods and also were secured in place with Capps.  Then, we went laterally to the lateral aspect of the facet of 3-4 and 4-5.  The periosteum was removed.  A mix of BMP and autograft was used for arthrodesis.  We went back to the operative site.  Valsalva maneuver was negative.  The foramen was wide open with plenty of  space only for the thecal sac, but also for the L3, L4 and L5 nerve roots.  Hemostasis was done.  Hemovac was left in the operative site.  Also vancomycin p.o.  Then, the wound was closed with different layers of Vicryl and the skin with staples.          ______________________________ Leeroy Cha, M.D.     EB/MEDQ  D:  07/28/2016  T:  07/29/2016  Job:  [id number]

## 2016-07-29 NOTE — Progress Notes (Signed)
Inpatient Diabetes Program Recommendations  AACE/ADA: New Consensus Statement on Inpatient Glycemic Control (2015)  Target Ranges:  Prepandial:   less than 140 mg/dL      Peak postprandial:   less than 180 mg/dL (1-2 hours)      Critically ill patients:  140 - 180 mg/dL   Lab Results  Component Value Date   GLUCAP 182 (H) 07/29/2016   HGBA1C 7.7 (H) 07/22/2016    Review of Glycemic Control:  Results for Dawn, [name] (MRN [id number]) as of 07/29/2016 11:24  Ref. Range 07/28/2016 08:41 07/28/2016 11:14 07/28/2016 16:00 07/28/2016 20:27 07/29/2016 06:09  Glucose-Capillary Latest Ref Range: 65 - 99 mg/dL 125 (H) 129 (H) 134 (H) 210 (H) 182 (H)    Diabetes history: Type 2 diabetes Outpatient Diabetes medications: Metformin 1000 mg bid, Humulin 70/30 28 units in the morning and 16 units q PM Current orders for Inpatient glycemic control:  Novolog moderate tid with meals and HS, Metformin 1000 mg bid  Inpatient Diabetes Program Recommendations:    While in the hospital, consider adding Lantus 16 units daily (note patient is on 70/30 at home).  Thanks, Adah Perl, RN, BC-ADM Inpatient Diabetes Coordinator Pager [phone] (8a-5p)

## 2016-07-29 NOTE — Consult Note (Signed)
Physical Medicine and Rehabilitation Consult   Reason for Consult: Lumbar stenosis with radiculopathy and NPH Referring Physician: Dr. Joya Salm.    HPI: Dawn [name] is a 65 y.o. 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 History taken from chart review. She elected to undergo bilateral L3 and L4 laminectomy with diskectomy and pedicle screws L3-L5. PT/OT evaluations done and patient noted to have significant left gaze preference with right inattention, decreased attention to tasks, poor safety with difficulty processing or following commands affecting overall mobility and ability to carry out self-care tasks.    Has been having difficulty walking and has required 24 hours supervision per neurology notes.    Review of Systems  Unable to perform ROS: Medical condition      Past Medical History:  Diagnosis Date  . Altered mental status 09/08/13   taken to [facility] ER  . Arthritis    spine  . Blood transfusion without reported diagnosis   . Diabetes mellitus without complication ([place])   . GERD (gastroesophageal reflux disease)   . Heart rate fast    runs 90's -110 is normal for her   . Hyperlipidemia   . Hypertension   . 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 Scoliosis   . Shortness of breath    Hx in past 25 yrs ago  . Stroke ([place]) 01/11/13   right corona radiata infarct, weakness of light side  . SVD (spontaneous vaginal delivery)    x 3  . Tachycardia     Past Surgical History:  Procedure Laterality Date  . COLONOSCOPY  07-11-2015   TA polyp with poor prep   . DILATATION & CURRETTAGE/HYSTEROSCOPY WITH RESECTOCOPE N/A 02/07/2014   Procedure: EXAM UNDER ANESTHESIA WITH BIOPSY, DILATATION & CURETTAGE/HYSTEROSCOPY;  Surgeon: Delice Lesch, MD;  Location: [facility];   Service: Gynecology;  Laterality: N/A;  . ESOPHAGOGASTRODUODENOSCOPY N/A 03/19/2015   Procedure: ESOPHAGOGASTRODUODENOSCOPY (EGD);  Surgeon: Lafayette Dragon, MD;  Location: [facility] ENDOSCOPY;  Service: Endoscopy;  Laterality: N/A;  . LUMBAR PUNCTURE    . TUBAL LIGATION    . UPPER GASTROINTESTINAL ENDOSCOPY     Family History  Problem Relation Age of Onset  . Hypertension Mother   . Hypertension Father   . Colon cancer Neg Hx   . Colon polyps Neg Hx   . Rectal cancer Neg Hx   . Stomach cancer Neg Hx     Social History:  Married. Reports that she lives with husband who manges home. Per reports that she quit smoking about 32 years ago. Her smoking use included Cigarettes. She has a 20.00 pack-year smoking history. She has never used smokeless tobacco. Per reports that she does not drink alcohol or use drugs.   Allergies  Allergen Reactions  . Penicillins Swelling    Has patient had a PCN reaction causing immediate rash, facial/tongue/throat swelling, SOB or lightheadedness with hypotension: Yes Has patient had a PCN reaction causing severe rash involving mucus membranes or skin necrosis: No Has patient had a PCN reaction that required hospitalization No Has patient had a PCN reaction occurring within the last 10 years: No If all of the above answers are "NO", then may proceed with Cephalosporin use.    Medications Prior to Admission  Medication Sig Dispense Refill  . amLODipine (NORVASC) 10 MG tablet  Take 10 mg by mouth daily.     Marland Kitchen BENICAR HCT 20-12.5 MG per tablet Take 1 tablet by mouth daily.     Marland Kitchen gabapentin (NEURONTIN) 100 MG capsule Take 1 capsule (100 mg total) by mouth 3 (three) times daily. 90 capsule 1  . hydrALAZINE (APRESOLINE) 25 MG tablet Take 25 mg by mouth 2 (two) times daily.     . Insulin Isophane & Regular Human (HUMULIN 70/30 MIX) (70-30) 100 UNIT/ML PEN Inject 16-28 Units into the skin See admin instructions. Takes 28 units in am and 16 units every evening    .  metFORMIN (GLUCOPHAGE) 1000 MG tablet Take 1,000 mg by mouth 2 (two) times daily with a meal.    . Multiple Vitamins-Minerals (MULTIVITAMIN WITH MINERALS) tablet Take 1 tablet by mouth daily.    . simvastatin (ZOCOR) 20 MG tablet Take 1 tablet (20 mg total) by mouth daily at 6 PM. 30 tablet 1  . aspirin 81 MG chewable tablet Chew 1 tablet (81 mg total) by mouth daily. DO NOT TAKE FOR 1 WEEK. May resume on 03/29/2015. (Patient not taking: Reported on 07/21/2016)      Home: Home Living Family/patient expects to be discharged to:: Private residence Living Arrangements: Spouse/significant other, Other relatives Available Help at Discharge: Family, Other (Comment) (not sure have much family is available) Type of Home: House Home Layout: One level Home Equipment: Wheelchair - manual Additional Comments: Pt is a poor historian; could not identify bathroom set up information.  Functional History: Prior Function Level of Independence:  (unsure of PLOF) Functional Status:  Mobility: Bed Mobility Overal bed mobility: Needs Assistance, +2 for physical assistance Bed Mobility: Rolling, Sidelying to Sit Rolling: Max assist, +2 for physical assistance Sidelying to sit: Max assist, +2 for physical assistance General bed mobility comments: cues for guarding Transfers Overall transfer level: Needs assistance Transfers: Sit to/from Stand, Stand Pivot Transfers Sit to Stand: Max assist, +2 physical assistance Stand pivot transfers: Max assist, +2 physical assistance General transfer comment: pt having difficulty maintaining focus on task, not able to process direction within task.  Pt needing significant lifting assist and stability while pt attempting steps. Ambulation/Gait General Gait Details: unable today    ADL: ADL Overall ADL's : Needs assistance/impaired General ADL Comments: pt currently max assist for all ADL. Required max +2 for stand pivot transfer. Supplemental O2 was off upon OT  arrival; SpO2=61% on RA. Applied 3L O2 with rise in SpO2 to low 90s prior to activity.  Cognition: Cognition Overall Cognitive Status: History of cognitive impairments - at baseline Orientation Level: Oriented to person, Oriented to place, Oriented to situation Cognition Arousal/Alertness: Awake/alert Behavior During Therapy: Flat affect Overall Cognitive Status: History of cognitive impairments - at baseline  Blood pressure 138/85, pulse (!) 114, temperature 98.5 F (36.9 C), temperature source Oral, resp. rate 20, height 5' (1.524 m), weight 82.1 kg (181 lb), SpO2 97 %. Physical Exam  Nursing note and vitals reviewed. Constitutional: She appears well-developed and well-nourished.  Sitting up in a chair with head turned to the left.   HENT:  Head: Normocephalic and atraumatic.  Eyes: Conjunctivae are normal. Pupils are equal, round, and reactive to light.  Neck: Normal range of motion. Neck supple.  Cardiovascular: Regular rhythm.   Tachycardia  Respiratory: Effort normal and breath sounds normal. No stridor. No respiratory distress. She has no wheezes.  +Ayden on 4 liters of oxygen.   GI: Soft. Bowel sounds are normal. She exhibits no distension. There is  no tenderness.  Musculoskeletal: She exhibits no edema or tenderness.  Neurological: She is alert.  Oriented to self and place.  Left gaze preference  Extensor tone BLE Unable to assess sensation due to mental condition Motor: Unable to assess due to lack of participation, but minimally moving all extremities  Skin: Skin is warm and dry.  Psychiatric: Her affect is blunt and inappropriate. Her speech is delayed. She is slowed and withdrawn. Cognition and memory are impaired. She expresses inappropriate judgment. She is inattentive.    Results for orders placed or performed during the hospital encounter of 07/28/16 (from the past 24 hour(s))  Glucose, capillary     Status: Abnormal   Collection Time: 07/28/16  4:00 PM  Result  Value Ref Range   Glucose-Capillary 134 (H) 65 - 99 mg/dL  Glucose, capillary     Status: Abnormal   Collection Time: 07/28/16  8:27 PM  Result Value Ref Range   Glucose-Capillary 210 (H) 65 - 99 mg/dL   Comment 1 Notify RN    Comment 2 Document in Chart   Glucose, capillary     Status: Abnormal   Collection Time: 07/29/16  6:09 AM  Result Value Ref Range   Glucose-Capillary 182 (H) 65 - 99 mg/dL   Comment 1 Notify RN    Comment 2 Document in Chart   Glucose, capillary     Status: Abnormal   Collection Time: 07/29/16 11:51 AM  Result Value Ref Range   Glucose-Capillary 167 (H) 65 - 99 mg/dL   Dg Lumbar Spine 2-3 Views  Result Date: 07/28/2016 CLINICAL DATA:  65 year old female for L3-4 and L4-5 laminectomy. Initial encounter. EXAM: LUMBAR SPINE - 2-3 VIEW COMPARISON:  05/29/2016 lumbar spine MR. FINDINGS: Two intraoperative lateral views of the lumbar spine submitted for review after surgery. Level assignment as on prior lumbar spine MR. First film reveals spinous process clamp at the L2 and L3 level. The second view reveals superior probe which is directed inferiorly posterior to the upper L5 level and inferior probe which is directed superiorly posterior to the L3-4 level. Surgical sponge in place. IMPRESSION: Localization L3-4 and L4-5 as noted above. Electronically Signed   By: Genia Del M.D.   On: 07/28/2016 16:21   Dg C-arm 1-60 Min  Result Date: 07/28/2016 CLINICAL DATA:  65 year old female for L3-4 and L4-5 fusion. Subsequent encounter. EXAM: DG C-ARM 61-120 MIN COMPARISON:  Intraoperative lateral views of the lumbar spine earlier in the day. 05/29/2016 lumbar spine MR. FINDINGS: Two C-arm views of the lumbar spine submitted for review after surgery. Bilateral pedicle screws L3, L4 and L5 with interbody space at L3-4 and L4-5. No obvious complication. This can be evaluated on follow-up. IMPRESSION: Fusion L3-4 and L4-5.  Please see above. Electronically Signed   By: Genia Del M.D.   On: 07/28/2016 16:23    Assessment/Plan: Diagnosis: Lumbar stenosis with radiculopathy and NPH Labs and images independently reviewed.  Records reviewed and summated above.  1. Does the need for close, 24 hr/day medical supervision in concert with the patient's rehab needs make it unreasonable for this patient to be served in a less intensive setting? Potentially  2. Co-Morbidities requiring supervision/potential complications: T2 DM (Monitor in accordance with exercise and adjust meds as necessary), hx of CVA, NPH with 3-6 months of increase in gait disorder (follow up neurosurg recs), incontinence and memory loss, lumbar stenosis L4-5 with spondylolisthesis (s/p surgery), AKI (avoid nephrotoxic meds) 3. Due to bladder management, bowel management, safety,  skin/wound care, disease management, medication administration, pain management and patient education, does the patient require 24 hr/day rehab nursing? Yes 4. Does the patient require coordinated care of a physician, rehab nurse, PT (1-2 hrs/day, 5 days/week), OT (1-2 hrs/day, 5 days/week) and SLP (1-2 hrs/day, 5 days/week) to address physical and functional deficits in the context of the above medical diagnosis(es)? Yes Addressing deficits in the following areas: balance, endurance, locomotion, strength, transferring, bowel/bladder control, bathing, dressing, grooming, cognition, speech and psychosocial support 5. Can the patient actively participate in an intensive therapy program of at least 3 hrs of therapy per day at least 5 days per week? Yes 6. The potential for patient to make measurable gains while on inpatient rehab is excellent 7. Anticipated functional outcomes upon discharge from inpatient rehab are mod assist and max assist  with PT, mod assist and max assist with OT, mod assist and max assist with SLP. 8. Estimated rehab length of stay to reach the above functional goals is: 20-24 days. 9. Does the patient have  adequate social supports and living environment to accommodate these discharge functional goals? Potentially 10. Anticipated D/C setting: Other 11. Anticipated post D/C treatments: SNF 12. Overall Rehab/Functional Prognosis: good and fair  RECOMMENDATIONS: This patient's condition is appropriate for continued rehabilitative care in the following setting: Will await further workup by Neurosurg and potential intervention for NPH, however, it pt's cognitive status does not change, would recommend SNF.  Will cont to follow. Patient has agreed to participate in recommended program. Potentially Note that insurance prior authorization may be required for reimbursement for recommended care.  Comment: Rehab Admissions Coordinator to follow up.  Delice Lesch, MD, Mellody Drown 07/29/2016

## 2016-07-29 NOTE — Progress Notes (Signed)
Patient ID: Dawn [name], female   DOB: 1951/03/10, 65 y.o.   MRN: [id number] Came to see the patient along rehabilitation. She moves all 4 extremities , able to recognize me but ha s tendency to turn the head to the right. Previous mri brain showed hydrocephalus. See orders.

## 2016-07-29 NOTE — Evaluation (Signed)
Physical Therapy Evaluation Patient Details Name: Dawn [name] MRN: [id number] DOB: Dec 31, 1950 Today's Date: 07/29/2016   History of Present Illness  pt is a 65 y/o female with h/o of stroke, NPH, HTN, scoliosis, admitted with bilateral radicular pain, s/p L34 lami, facetectomy and discectomies with PLA and autograft.  Clinical Impression  Pt admitted with/for lumbar fusion surgery.  Pt currently holds her gaze to the extreme left, can not maintain focus on activities in therapy or her breakfast tray to eat.  She needs +2 max assist generally for basic mobility..  Pt currently limited functionally due to the problems listed. ( See problems list.)   Pt will benefit from PT to maximize function and safety in order to get ready for next venue listed below.     Follow Up Recommendations SNF    Equipment Recommendations  Other (comment) (TBA further)    Recommendations for Other Services       Precautions / Restrictions Precautions Precautions: Back;Fall Required Braces or Orthoses: Spinal Brace Spinal Brace: Lumbar corset;Applied in sitting position      Mobility  Bed Mobility Overal bed mobility: Needs Assistance;+2 for physical assistance Bed Mobility: Rolling;Sidelying to Sit Rolling: Max assist;+2 for physical assistance Sidelying to sit: Max assist;+2 for physical assistance       General bed mobility comments: cues for guarding  Transfers Overall transfer level: Needs assistance   Transfers: Sit to/from Stand;Stand Pivot Transfers Sit to Stand: Max assist;+2 physical assistance Stand pivot transfers: Max assist;+2 physical assistance       General transfer comment: pt having difficulty maintaining focus on task, not able to process direction within task.  Pt needing significant lifting assist and stability while pt attempting steps.  Ambulation/Gait             General Gait Details: unable today  Stairs            Wheelchair Mobility    Modified  Rankin (Stroke Patients Only)       Balance Overall balance assessment: Needs assistance Sitting-balance support: Bilateral upper extremity supported;Single extremity supported Sitting balance-Leahy Scale: Poor Sitting balance - Comments: needed minimal support   Standing balance support: Bilateral upper extremity supported Standing balance-Leahy Scale: Zero Standing balance comment: pt unable to attain full upright positioning.  Reliant on maximal assist of 2                             Pertinent Vitals/Pain Pain Assessment: Faces Faces Pain Scale: Hurts little more Pain Location: back Pain Descriptors / Indicators: Grimacing;Operative site guarding Pain Intervention(s): Monitored during session;Repositioned;PCA encouraged    Home Living Family/patient expects to be discharged to:: Private residence Living Arrangements: Spouse/significant other;Other relatives Available Help at Discharge: Family;Other (Comment) (not sure have much family is available) Type of Home: House       Home Layout: One level Home Equipment: Wheelchair - manual      Prior Function Level of Independence:  (unsure of PLOF)               Hand Dominance        Extremity/Trunk Assessment   Upper Extremity Assessment: Defer to OT evaluation           Lower Extremity Assessment: Generalized weakness (R weaker than L LE)         Communication   Communication: No difficulties  Cognition Arousal/Alertness: Awake/alert Behavior During Therapy: Flat affect Overall Cognitive Status: History of cognitive impairments -  at baseline                      General Comments General comments (skin integrity, edema, etc.): pt education intiated with back care/prec, log roll and donning brace, but pt unable to maintain focus enough to participate well.    Exercises     Assessment/Plan    PT Assessment Patient needs continued PT services  PT Problem List Decreased  activity tolerance;Decreased strength;Decreased balance;Decreased mobility;Decreased cognition;Decreased knowledge of use of DME;Decreased safety awareness;Decreased knowledge of precautions;Pain          PT Treatment Interventions DME instruction;Gait training;Stair training;Functional mobility training;Therapeutic activities;Balance training;Patient/family education    PT Goals (Current goals can be found in the Care Plan section)  Acute Rehab PT Goals Patient Stated Goal: I want to get better. PT Goal Formulation: With patient Time For Goal Achievement: 08/12/16 Potential to Achieve Goals: Fair    Frequency Min 5X/week   Barriers to discharge        Co-evaluation PT/OT/SLP Co-Evaluation/Treatment: Yes Reason for Co-Treatment: Complexity of the patient's impairments (multi-system involvement) PT goals addressed during session: Mobility/safety with mobility         End of Session   Activity Tolerance: Other (comment) (pt unable to participate much with activity due to mentation) Patient left: in chair;with call bell/phone within reach;with chair alarm set Nurse Communication: Mobility status;Precautions;Other (comment) (pt unable to use PCA or focus on breakfast tray to eat.)         Time: 1108-1140 PT Time Calculation (min) (ACUTE ONLY): 32 min   Charges:   PT Evaluation $PT Eval Moderate Complexity: 1 Procedure     PT G Codes:        Orchid Glassberg, Tessie Fass 07/29/2016, 12:16 PM 07/29/2016  Donnella Sham, PT [phone]  (pager)

## 2016-07-29 NOTE — Care Management Note (Signed)
Case Management Note  Patient Details  Name: Kathline Fowble MRN: [id number] Date of Birth: 1950/11/18  Subjective/Objective:   Pt s/p lumbar surgery. She is from home with her spouse.                  Action/Plan: Awaiting PT/OT recommendations. CM following for discharge needs.   Expected Discharge Date:                  Expected Discharge Plan:     In-House Referral:     Discharge planning Services     Post Acute Care Choice:    Choice offered to:     DME Arranged:    DME Agency:     HH Arranged:    HH Agency:     Status of Service:  In process, will continue to follow  If discussed at Long Length of Stay Meetings, dates discussed:    Additional Comments:  Pollie Friar, RN 07/29/2016, 10:32 AM

## 2016-07-30 ENCOUNTER — Inpatient Hospital Stay (HOSPITAL_COMMUNITY): Payer: Managed Care, Other (non HMO)

## 2016-07-30 DIAGNOSIS — I639 Cerebral infarction, unspecified: Secondary | ICD-10-CM

## 2016-07-30 LAB — GLUCOSE, CAPILLARY
GLUCOSE-CAPILLARY: 183 mg/dL — AB (ref 65–99)
GLUCOSE-CAPILLARY: 171 mg/dL — AB (ref 65–99)
Glucose-Capillary: 139 mg/dL — ABNORMAL HIGH (ref 65–99)
Glucose-Capillary: 193 mg/dL — ABNORMAL HIGH (ref 65–99)

## 2016-07-30 MED ORDER — GADOBENATE DIMEGLUMINE 529 MG/ML IV SOLN
20.0000 mL | Freq: Once | INTRAVENOUS | Status: AC
  Administered 2016-07-30: 20 mL via INTRAVENOUS

## 2016-07-30 MED ORDER — ASPIRIN 81 MG PO CHEW
324.0000 mg | CHEWABLE_TABLET | Freq: Every day | ORAL | Status: DC
  Administered 2016-07-31 – 2016-08-05 (×6): 324 mg via ORAL
  Filled 2016-07-30 (×8): qty 4

## 2016-07-30 MED ORDER — STROKE: EARLY STAGES OF RECOVERY BOOK
Freq: Once | Status: AC
  Administered 2016-07-30: 18:00:00
  Filled 2016-07-30: qty 1

## 2016-07-30 MED FILL — Heparin Sodium (Porcine) Inj 1000 Unit/ML: INTRAMUSCULAR | Qty: 30 | Status: AC

## 2016-07-30 MED FILL — Sodium Chloride IV Soln 0.9%: INTRAVENOUS | Qty: 1000 | Status: AC

## 2016-07-30 NOTE — Anesthesia Postprocedure Evaluation (Signed)
Anesthesia Post Note  Patient: Dawn [name]  Procedure(s) Performed: Procedure(s) (LRB): Lumbar three- four, Lumbar four- five Decompression/Fusion with Cage and screws (N/A)  Patient location during evaluation: PACU Anesthesia Type: General Level of consciousness: awake Pain management: pain level controlled Vital Signs Assessment: post-procedure vital signs reviewed and stable Respiratory status: spontaneous breathing Cardiovascular status: stable Anesthetic complications: no    Last Vitals:  Vitals:   07/30/16 1227 07/30/16 1435  BP: 132/75 131/75  Pulse: (!) 121 (!) 118  Resp: 20 18  Temp:  37.5 C    Last Pain:  Vitals:   07/30/16 1435  TempSrc: Oral  PainSc:                  EDWARDS,Mahki Spikes

## 2016-07-30 NOTE — Progress Notes (Signed)
RN paged Dr Joya Salm to view the MRI result. Pt passed swallow test. NIH is 11. Will continue to monitor pt.

## 2016-07-30 NOTE — Progress Notes (Signed)
Pt for CT Angio Head and Neck but creatnine is 67 days old and needs a new one before scan can be done.  RN was informed and to make sure she gets one.

## 2016-07-30 NOTE — Progress Notes (Signed)
Inpatient Rehabilitation  Met with patient, who had limited ability to participate in the conversation about her post acute rehab.  Left booklets and called spouse, Delcie Roch to discuss team's recommendations with him.  Plan to follow along for medical readiness as well as hopeful cognitive improvements.  Please call with questions.   Carmelia Roller., CCC/SLP Admission Coordinator  [place]  Cell [phone]

## 2016-07-30 NOTE — Progress Notes (Signed)
Inpatient Rehabilitation  Attempted to follow up with patient and family to discuss team's recommendations for post acute rehab; however, patient is in a procedures. Plan to follow up this afternoon.  Please call with questions.   Carmelia Roller., CCC/SLP Admission Coordinator  [place]  Cell [phone]

## 2016-07-30 NOTE — Progress Notes (Signed)
Patient ID: Dawn [name], female   DOB: Nov 08, 1950, 65 y.o.   MRN: [id number] Seen by neuro. No family around. More awake.no weakness but gaze to the right.

## 2016-07-30 NOTE — Progress Notes (Signed)
Patient ID: Dawn [name], female   DOB: 1951/04/05, 65 y.o.   MRN: [id number] Able to answer but she takes a lot of time processing. Moves all 4 extremities. Drainage less. Plan to have a mri brain today. I am aware of the history of hydrocephalus. Will call neuro/psch to see. Will try a csf drainage next wek based on advise and mri

## 2016-07-30 NOTE — Consult Note (Addendum)
Requesting Physician: Dr. Joya Salm    Chief Complaint: stroke   HPI:                                                                                                                                         Dawn [name] is an 65 y.o. female presenting to [facility] for a bilateral L3 and L4 laminectomy, bilateral L3-4 and L4-5 discectomy with pedicle screws at L3, L4, L5 along with a posterolateral arthrodesis. Apparently she was working with PT on 07/29/2016 and she was noted to have a left gaze preference. They thought that they also noted some right sided inattention. It is also noted at that time that patient needed several prompts to get her to do the activities that were asked.  MRI brain was obtained today on 07/30/2016 and noted a 5 mm acute infarct in the pons, along the floor of the fourth ventricle. 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 During consultation patient markedly did not want to take part in any portion of the exam are questioning. Patient did have a left gaze preference however she did cross midline when asked and was moving all extremities. It took many prompts to get her to follow commands in addition patient became very agitated and angry with any type of maneuvering of her body. She soon stated that she would not answer any further questions and that she would "start hitting people if we did not leave". Attempts to contact spouse to find her baseline was made however this person did not answer their phone.  Recent stroke labs: A1c 7.7 On 06/17/2016 she had a dementia panel that was completed in the office. Of that it showed a vitamin B12 of 377, homocystine of 12.4, TSH of 0.949, and RPR non-reactive.    Past Medical History:  Diagnosis Date  . Altered mental status 09/08/13   taken to [facility] ER  . Arthritis    spine  . Blood transfusion without reported diagnosis   . Diabetes mellitus without complication ([place])   . GERD (gastroesophageal  reflux disease)   . Heart rate fast    runs 90's -110 is normal for her   . Hyperlipidemia   . Hypertension   . 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 Scoliosis   . Shortness of breath    Hx in past 25 yrs ago  . Stroke ([place]) 01/11/13   right corona radiata infarct, weakness of light side  . SVD (spontaneous vaginal delivery)    x 3  . Tachycardia     Past Surgical History:  Procedure Laterality Date  . COLONOSCOPY  07-11-2015   TA polyp with poor prep   . DILATATION & CURRETTAGE/HYSTEROSCOPY WITH RESECTOCOPE N/A 02/07/2014   Procedure: EXAM UNDER ANESTHESIA WITH BIOPSY, DILATATION & CURETTAGE/HYSTEROSCOPY;  Surgeon: Delice Lesch, MD;  Location: [facility]  ORS;  Service: Gynecology;  Laterality: N/A;  . ESOPHAGOGASTRODUODENOSCOPY N/A 03/19/2015   Procedure: ESOPHAGOGASTRODUODENOSCOPY (EGD);  Surgeon: Lafayette Dragon, MD;  Location: [facility] ENDOSCOPY;  Service: Endoscopy;  Laterality: N/A;  . LUMBAR PUNCTURE    . TUBAL LIGATION    . UPPER GASTROINTESTINAL ENDOSCOPY      Family History  Problem Relation Age of Onset  . Hypertension Mother   . Hypertension Father   . Colon cancer Neg Hx   . Colon polyps Neg Hx   . Rectal cancer Neg Hx   . Stomach cancer Neg Hx    Social History:  reports that she quit smoking about 32 years ago. Her smoking use included Cigarettes. She has a 20.00 pack-year smoking history. She has never used smokeless tobacco. She reports that she does not drink alcohol or use drugs.  Allergies:  Allergies  Allergen Reactions  . Penicillins Swelling    Has patient had a PCN reaction causing immediate rash, facial/tongue/throat swelling, SOB or lightheadedness with hypotension: Yes Has patient had a PCN reaction causing severe rash involving mucus membranes or skin necrosis: No Has patient had a PCN reaction that required hospitalization No Has patient had a PCN reaction occurring within the  last 10 years: No If all of the above answers are "NO", then may proceed with Cephalosporin use.     Medications:                                                                                                                           Prior to Admission:  Prescriptions Prior to Admission  Medication Sig Dispense Refill Last Dose  . amLODipine (NORVASC) 10 MG tablet Take 10 mg by mouth daily.    07/28/2016 at Unknown time  . BENICAR HCT 20-12.5 MG per tablet Take 1 tablet by mouth daily.    07/28/2016 at Unknown time  . gabapentin (NEURONTIN) 100 MG capsule Take 1 capsule (100 mg total) by mouth 3 (three) times daily. 90 capsule 1 07/27/2016 at Unknown time  . hydrALAZINE (APRESOLINE) 25 MG tablet Take 25 mg by mouth 2 (two) times daily.    07/28/2016 at Unknown time  . Insulin Isophane & Regular Human (HUMULIN 70/30 MIX) (70-30) 100 UNIT/ML PEN Inject 16-28 Units into the skin See admin instructions. Takes 28 units in am and 16 units every evening   07/27/2016 at Unknown time  . metFORMIN (GLUCOPHAGE) 1000 MG tablet Take 1,000 mg by mouth 2 (two) times daily with a meal.   07/27/2016 at Unknown time  . Multiple Vitamins-Minerals (MULTIVITAMIN WITH MINERALS) tablet Take 1 tablet by mouth daily.   Past Week at Unknown time  . simvastatin (ZOCOR) 20 MG tablet Take 1 tablet (20 mg total) by mouth daily at 6 PM. 30 tablet 1 07/27/2016 at Unknown time  . aspirin 81 MG chewable tablet Chew 1 tablet (81 mg total) by mouth daily. DO NOT TAKE FOR 1 WEEK. May resume on 03/29/2015. (Patient not  taking: Reported on 07/21/2016)   Not Taking at Unknown time   Scheduled: . amLODipine  10 mg Oral Daily  . aspirin  81 mg Oral Daily  . chlorhexidine  15 mL Mouth Rinse BID  . hydrALAZINE  25 mg Oral BID  . irbesartan  150 mg Oral Daily   And  . hydrochlorothiazide  12.5 mg Oral Daily  . insulin aspart  0-15 Units Subcutaneous TID WC  . insulin aspart  0-5 Units Subcutaneous QHS  . mouth rinse  15 mL Mouth Rinse  [id number]  . metFORMIN  1,000 mg Oral BID WC  . simvastatin  20 mg Oral q1800  . sodium chloride flush  3 mL Intravenous Q12H  . vancomycin  1,500 mg Intravenous Q24H    ROS:                                                                                                                                       History obtained from unobtainable from patient due to mental status    Neurologic Examination:                                                                                                      Blood pressure 131/75, pulse (!) 118, temperature 99.5 F (37.5 C), temperature source Oral, resp. rate 18, height 5' (1.524 m), weight 82.1 kg (181 lb), SpO2 94 %.  HEENT-  Normocephalic, no lesions, without obvious abnormality.  Normal external eye and conjunctiva.  Normal TM's bilaterally.  Normal auditory canals and external ears. Normal external nose, mucus membranes and septum.  Normal pharynx. Cardiovascular- S1, S2 normal, pulses palpable throughout   Lungs- chest clear, no wheezing, rales, normal symmetric air entry Abdomen- soft, non-tender; bowel sounds normal; no masses,  no organomegaly Extremities- no edema Lymph-no adenopathy palpable Musculoskeletal-no joint tenderness, deformity or swelling Skin-warm and dry, no hyperpigmentation, vitiligo, or suspicious lesions  Neurological Examination Mental Status: Alert, Speech is clear. Patient was able to state she has been incontinent and her gait has been off otherwise not very verbal Cranial Nerves: II: Blinks to threat bilaterally pupils equal, round, reactive to light and accommodation III,IV, VI: ptosis not present, extra-ocular motions intact bilaterally--at rest she does have a left gaze preference--and saccades with left and right movement. V,VII: At rest she has a right facial droop, facial light touch sensation difficult to obtain as patient would not take part in exam however she did wince to stimuli on both sides of  her  face VIII: hearing normal bilaterally IX,X: uvula rises symmetrically XI: bilateral shoulder shrug XII: midline tongue extension Motor: She is moving all extremities antigravity with the upper extremities more than lower extremities however with noxious stimuli patient was able to lift bilateral lower extremities at approximately the same strength. Clear formal testing of strength was not capable as patient was extremely agitated and started to become belligerent and threatening to her people in the room. Sensory: Pinprick and light touch intact throughout, bilaterally Deep Tendon Reflexes: 1+ and symmetric throughout Plantars: Right: downgoing   Left: downgoing Cerebellar: Unable to assess  Gait: Unable to assess       Lab Results: Basic Metabolic Panel: No results for input(s): NA, K, CL, CO2, GLUCOSE, BUN, CREATININE, CALCIUM, MG, PHOS in the last 168 hours.  Liver Function Tests: No results for input(s): AST, ALT, ALKPHOS, BILITOT, PROT, ALBUMIN in the last 168 hours. No results for input(s): LIPASE, AMYLASE in the last 168 hours. No results for input(s): AMMONIA in the last 168 hours.  CBC: No results for input(s): WBC, NEUTROABS, HGB, HCT, MCV, PLT in the last 168 hours.  Cardiac Enzymes: No results for input(s): CKTOTAL, CKMB, CKMBINDEX, TROPONINI in the last 168 hours.  Lipid Panel: No results for input(s): CHOL, TRIG, HDL, CHOLHDL, VLDL, LDLCALC in the last 168 hours.  CBG:  Recent Labs Lab 07/29/16 1151 07/29/16 1645 07/29/16 2023 07/30/16 0605 07/30/16 1206  GLUCAP 167* 168* 171* 183* 171*    Microbiology: Results for orders placed or performed during the hospital encounter of 07/22/16  Surgical pcr screen     Status: None   Collection Time: 07/22/16  1:58 PM  Result Value Ref Range Status   MRSA, PCR NEGATIVE NEGATIVE Final   Staphylococcus aureus NEGATIVE NEGATIVE Final    Comment:        The Xpert SA Assay (FDA approved for NASAL specimens in  patients over 80 years of age), is one component of a comprehensive surveillance program.  Test performance has been validated by [facility] for patients greater than or equal to 22 year old. It is not intended to diagnose infection nor to guide or monitor treatment.     Coagulation Studies: No results for input(s): LABPROT, INR in the last 72 hours.  Imaging: Mr Jeri Cos [id number] Contrast  Result Date: 07/30/2016 CLINICAL DATA:  Hydrocephalus.  Decreased mental awareness. EXAM: MRI HEAD WITHOUT AND WITH CONTRAST TECHNIQUE: Multiplanar, multiecho pulse sequences of the brain and surrounding structures were obtained without and with intravenous contrast. CONTRAST:  69mL MULTIHANCE GADOBENATE DIMEGLUMINE 529 MG/ML IV SOLN COMPARISON:  05/29/2016 FINDINGS: Brain: 5 mm focus of restricted diffusion along the midline dorsal pons. There has been remote small vessel infarcts in the left cerebellum. Marked ventriculomegaly affecting all ventricles with prominent CSF flow at the cerebral aqua duct. Superimposed parietal and occipital white matter volume loss. Appearance is consistent with a communicating hydrocephalus. T2 and FLAIR hyperintensity around the lateral ventricles is stable from prior, chronicity suggesting gliosis primarily. There could be superimposed chronic transependymal CSF flow. Vascular: Normal flow voids. Skull and upper cervical spine: Normal marrow signal. Sinuses/Orbits: Chronic trapped fluid in right petrous air cells. Other: Motion degraded scan which could obscure pathology. IMPRESSION: 1. 5 mm acute infarct in the pons, along the floor of the fourth ventricle. 2. Unchanged marked ventriculomegaly with a communicating hydrocephalus pattern. 3. Atrophy with severe posterior cerebral white matter volume loss. Electronically Signed   By: Neva Seat.D.  On: 07/30/2016 11:33   Dg C-arm 1-60 Min  Addendum Date: 07/30/2016   ADDENDUM REPORT: 07/30/2016 07:15 ADDENDUM:  Fluoroscopic time provided is 26 seconds. Please refer to operative report. Electronically Signed   By: Genia Del M.D.   On: 07/30/2016 07:15   Result Date: 07/30/2016 CLINICAL DATA:  65 year old female for L3-4 and L4-5 fusion. Subsequent encounter. EXAM: DG C-ARM 61-120 MIN COMPARISON:  Intraoperative lateral views of the lumbar spine earlier in the day. 05/29/2016 lumbar spine MR. FINDINGS: Two C-arm views of the lumbar spine submitted for review after surgery. Bilateral pedicle screws L3, L4 and L5 with interbody space at L3-4 and L4-5. No obvious complication. This can be evaluated on follow-up. IMPRESSION: Fusion L3-4 and L4-5.  Please see above. Electronically Signed: By: Genia Del M.D. On: 07/28/2016 16:23       Assessment and plan discussed with with attending physician and they are in agreement.    Etta Quill PA-C [facility] [phone]  07/30/2016, 2:58 PM   Assessment: 65 y.o. female with small pontine infarct which I suspect is affecting the para pontine reticular formation which controls lateral gaze. Suspect is a partial palsy, she still has saccades in both directions, and can overcome the palsy.   I suspect her slowed responses, and confusion are more related to the communicative hydrocephalus than the stroke.  Stroke Risk Factors - hyperlipidemia and hypertension  1. HgbA1c, fasting lipid panel 2. CTA head and neck  3. Frequent neuro checks 4. Echocardiogram 5. Prophylactic therapy-aspirin 7. Risk factor modification 8. Telemetry monitoring 9. PT consult, OT consult, Speech consult 10. please page stroke NP  Or  PA  Or MD from 8am -4 pm starting 10/13 as this patient will be followed by the stroke team at this point.   You can look them up on [email or web address]    Roland Rack, MD [facility] [phone]  If 7pm- 7am, please page neurology on call as listed in [place].

## 2016-07-31 ENCOUNTER — Encounter (HOSPITAL_COMMUNITY): Payer: Self-pay | Admitting: Radiology

## 2016-07-31 ENCOUNTER — Inpatient Hospital Stay (HOSPITAL_COMMUNITY): Payer: Managed Care, Other (non HMO)

## 2016-07-31 DIAGNOSIS — I6302 Cerebral infarction due to thrombosis of basilar artery: Secondary | ICD-10-CM

## 2016-07-31 DIAGNOSIS — E785 Hyperlipidemia, unspecified: Secondary | ICD-10-CM

## 2016-07-31 DIAGNOSIS — I10 Essential (primary) hypertension: Secondary | ICD-10-CM

## 2016-07-31 DIAGNOSIS — I6789 Other cerebrovascular disease: Secondary | ICD-10-CM

## 2016-07-31 LAB — LIPID PANEL
CHOL/HDL RATIO: 3.7 ratio
CHOLESTEROL: 85 mg/dL (ref 0–200)
HDL: 23 mg/dL — AB (ref >40)
LDL Cholesterol: 41 mg/dL (ref 0–99)
TRIGLYCERIDES: 106 mg/dL (ref <150)
VLDL: 21 mg/dL (ref 0–40)

## 2016-07-31 LAB — ECHOCARDIOGRAM COMPLETE
Height: 60 in
WEIGHTICAEL: 2896 [oz_av]

## 2016-07-31 LAB — CREATININE, SERUM
CREATININE: 1.15 mg/dL — AB (ref 0.44–1.00)
GFR calc Af Amer: 57 mL/min — ABNORMAL LOW (ref >60)
GFR, EST NON AFRICAN AMERICAN: 50 mL/min — AB (ref >60)

## 2016-07-31 LAB — GLUCOSE, CAPILLARY
GLUCOSE-CAPILLARY: 154 mg/dL — AB (ref 65–99)
GLUCOSE-CAPILLARY: 175 mg/dL — AB (ref 65–99)
GLUCOSE-CAPILLARY: 221 mg/dL — AB (ref 65–99)
Glucose-Capillary: 185 mg/dL — ABNORMAL HIGH (ref 65–99)

## 2016-07-31 MED ORDER — IOPAMIDOL (ISOVUE-370) INJECTION 76%
INTRAVENOUS | Status: AC
  Administered 2016-07-31: 50 mL
  Filled 2016-07-31: qty 50

## 2016-07-31 NOTE — Care Management Note (Signed)
Case Management Note  Patient Details  Name: Dawn [name] MRN: [id number] Date of Birth: July 02, 1951  Subjective/Objective:                    Action/Plan: Pt positive for CVA. Recommendations are CIR vs SNF. CM following for discharge disposition.  Expected Discharge Date:                  Expected Discharge Plan:     In-House Referral:     Discharge planning Services     Post Acute Care Choice:    Choice offered to:     DME Arranged:    DME Agency:     HH Arranged:    HH Agency:     Status of Service:  In process, will continue to follow  If discussed at Long Length of Stay Meetings, dates discussed:    Additional Comments:  Pollie Friar, RN 07/31/2016, 11:14 AM

## 2016-07-31 NOTE — Progress Notes (Signed)
  Echocardiogram 2D Echocardiogram has been performed.  Dawn [name] 07/31/2016, 12:06 PM

## 2016-07-31 NOTE — Progress Notes (Signed)
Physical Therapy Treatment Patient Details Name: Dawn [name] MRN: [id number] DOB: 1951/05/19 Today's Date: 07/31/2016    History of Present Illness pt is a 65 y/o female with h/o of stroke, NPH, HTN, scoliosis, admitted with bilateral radicular pain, s/p L34 lami, facetectomy and discectomies with PLA and autograft.    PT Comments    Progressing slowly, limited by inattention and limited focus.  Emphasized tracking and attention and well as standing tolerance and transfers.   Follow Up Recommendations  CIR     Equipment Recommendations  Other (comment)    Recommendations for Other Services Rehab consult     Precautions / Restrictions Precautions Precautions: Back;Fall Required Braces or Orthoses: Spinal Brace Spinal Brace: Lumbar corset;Applied in sitting position Restrictions Weight Bearing Restrictions: No    Mobility  Bed Mobility Overal bed mobility: Needs Assistance;+2 for physical assistance Bed Mobility: Rolling;Sidelying to Sit Rolling: Max assist Sidelying to sit: Max assist;+2 for physical assistance       General bed mobility comments: cues for direction and assist for both roll and transition to sit.  Transfers Overall transfer level: Needs assistance Equipment used: Rolling walker (2 wheeled) Transfers: Sit to/from [employer] Sit to Stand: Mod assist;+2 physical assistance Stand pivot transfers: Max assist;+2 physical assistance       General transfer comment: pt able to pivot with therapist in front of pt and 2nd therapist to Rt holding pt's hand to promote attention to Rt and with significant assist and cuing to keep her focused  Ambulation/Gait                 Stairs            Wheelchair Mobility    Modified Rankin (Stroke Patients Only) Modified Rankin (Stroke Patients Only) Pre-Morbid Rankin Score: Slight disability Modified Rankin: Severe disability     Balance Overall balance assessment: Needs  assistance   Sitting balance-Leahy Scale: Poor Sitting balance - Comments: consistent list to the right     Standing balance-Leahy Scale: Poor Standing balance comment: sit to stand x2, but pt unable to come to full upright stance.                    Cognition Arousal/Alertness: Awake/alert Behavior During Therapy: Flat affect Overall Cognitive Status: Impaired/Different from baseline Area of Impairment: Orientation;Attention;Following commands;Awareness Orientation Level: Place;Time;Situation;Disoriented to Current Attention Level: Focused Memory: Decreased short-term memory;Decreased recall of precautions Following Commands: Follows one step commands inconsistently   Awareness: Intellectual   General Comments: pt  unable to maintain focus today more than yesterday.  Pt's attention and mentation decreased as she fatigued.    Exercises      General Comments General comments (skin integrity, edema, etc.): Today, worked more on tracking and attention while pt sitting EOB.  This appeared to fatigue her significantly, making it more difficult to work on standing and transfer activity.      Pertinent Vitals/Pain Pain Assessment: Faces Faces Pain Scale: Hurts little more Pain Location: back Pain Descriptors / Indicators: Grimacing;Guarding Pain Intervention(s): Monitored during session;Repositioned    Home Living                      Prior Function            PT Goals (current goals can now be found in the care plan section) Acute Rehab PT Goals Patient Stated Goal: I want to get better. PT Goal Formulation: With patient Time For Goal Achievement: 08/12/16 Potential  to Achieve Goals: Fair Progress towards PT goals: Progressing toward goals    Frequency    Min 4X/week      PT Plan Discharge plan needs to be updated    Co-evaluation PT/OT/SLP Co-Evaluation/Treatment: Yes Reason for Co-Treatment: For patient/therapist safety PT goals addressed  during session: Mobility/safety with mobility OT goals addressed during session: ADL's and self-care     End of Session   Activity Tolerance: Other (comment) (limited by fatigue and inattension) Patient left: in chair;with call bell/phone within reach;with chair alarm set     Time: [id number] PT Time Calculation (min) (ACUTE ONLY): 37 min  Charges:  $Therapeutic Activity: 8-22 mins                    G Codes:      Dawn [name], Dawn [name] 07/31/2016, 4:38 PM  07/31/2016  Dawn [name], [place] [phone]  (pager)

## 2016-07-31 NOTE — Progress Notes (Signed)
Occupational Therapy Treatment Patient Details Name: Dawn [name] MRN: [id number] DOB: 02-Jan-1951 Today's Date: 07/31/2016    History of present illness pt is a 65 y/o female with h/o of stroke, NPH, HTN, scoliosis, admitted with bilateral radicular pain, s/p L34 lami, facetectomy and discectomies with PLA and autograft.   OT comments  Pt seen with PT with focus on visual scanning, attention to task, orientation, and functional mobility.  Pt with Lt gaze preference but able to scan to Rt with mod cues and use of calling name and visual target to locate.  Even with scanning to Rt, pt still with decrease visual attention with decreased focus on target.  Pt disoriented to place and situation stating that she was in her kitchen as with max cues pt able to state "back" after therapist oriented to surgery, with no recollection of stroke.  Pt with Rt lean in sitting requiring mod-max assist to obtain and maintain sitting balance.  Mod assist +2 sit > stand with UE support on RW to promote anterior weight shift.  On third attempt, completed stand pivot transfer with therapist advancing RLE in pivot fashion to sit in chair while 2nd therapist held [place] hand to promote attention to Rt visual field during transfer.  Pt will continue to benefit from acute OT in preparation for d/c to venue below to increase independence with ADLs and functional mobility.  Follow Up Recommendations  CIR;Supervision/Assistance - 24 hour    Equipment Recommendations   (TBD at next venue of care)    Recommendations for Other Services Speech consult;Rehab consult    Precautions / Restrictions Precautions Precautions: Back;Fall Required Braces or Orthoses: Spinal Brace Spinal Brace: Lumbar corset;Applied in sitting position       Mobility Bed Mobility                  Transfers Overall transfer level: Needs assistance Equipment used: Rolling walker (2 wheeled) Transfers: Sit to/from [employer] Sit to Stand: Mod assist;+2 physical assistance Stand pivot transfers: Max assist;+2 physical assistance       General transfer comment: pt able to pivot with therapist in front of pt and 2nd therapist to Rt holding pt's hand to promote attention to Rt and with significant assist and cuing to keep her focused        ADL Overall ADL's : Needs assistance/impaired                                       General ADL Comments: pt currently max assist for all ADL. Required max +2 for stand pivot transfer. Pt with Lt gaze preference with ability to visually scan to midline and intermittently past midline.  Pt with decreased sustained attention and difficulty initiating and following directions.      Vision                 Additional Comments: Pt presenting with Lt gaze preference.  Able to scan to Rt with mod verbal cues, however inconsistently.  Pt required auditory cue of name being called to increase attention          Cognition   Behavior During Therapy: Flat affect Overall Cognitive Status: Impaired/Different from baseline Area of Impairment: Orientation;Attention;Following commands;Awareness Orientation Level: Place;Time;Situation;Disoriented to      Following Commands: Follows one step commands inconsistently  Frequency  Min 2X/week        Progress Toward Goals  OT Goals(current goals can now be found in the care plan section)  Progress towards OT goals: Progressing toward goals     Plan Discharge plan needs to be updated    Co-evaluation    PT/OT/SLP Co-Evaluation/Treatment: Yes     OT goals addressed during session: ADL's and self-care      End of Session Equipment Utilized During Treatment: Back brace;Rolling walker;Oxygen   Activity Tolerance Patient tolerated treatment well   Patient Left in chair;with call bell/phone within reach;with chair alarm set   Nurse Communication Mobility  status        Time: [id number] OT Time Calculation (min): 26 min  Charges: OT Treatments $Self Care/Home Management : 8-22 mins  Simonne Come, [id number] 07/31/2016, 3:56 PM

## 2016-07-31 NOTE — Progress Notes (Signed)
STROKE TEAM PROGRESS NOTE   HISTORY OF PRESENT ILLNESS (per record) Dawn [name] is an 64 y.o. female presenting to [facility] for a bilateral L3 and L4 laminectomy, bilateral L3-4 and L4-5 discectomy with pedicle screws at L3, L4, L5 along with a posterolateral arthrodesis. Apparently she was working with PT on 07/29/2016 and she was noted to have a left gaze preference. They thought that they also noted some right sided inattention. It is also noted at that time that patient needed several prompts to get her to do the activities that were asked.  MRI brain was obtained today on 07/30/2016 and noted a 5 mm acute infarct in the pons, along the floor of the fourth ventricle. 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 During consultation patient markedly did not want to take part in any portion of the exam are questioning. Patient did have a left gaze preference however she did cross midline when asked and was moving all extremities. It took many prompts to get her to follow commands in addition patient became very agitated and angry with any type of maneuvering of her body. She soon stated that she would not answer any further questions and that she would "start hitting people if we did not leave". Attempts to contact spouse to find her baseline was made however this person did not answer their phone.  Recent stroke labs: A1c 7.7 On 06/17/2016 she had a dementia panel that was completed in the office. Of that it showed a vitamin B12 of 377, homocystine of 12.4, TSH of 0.949, and RPR non-reactive.   SUBJECTIVE (INTERVAL HISTORY) No family is at the bedside.  Overall her condition is stable. Pt sitting in chair, lethargic, sleeping, with eyes open on voice. However, not cooperative on exam and not following commands. Eyes able to attend both sides.    OBJECTIVE Temp:  [97.5 F (36.4 C)-99.7 F (37.6 C)] 99.7 F (37.6 C) (10/13 0927) Pulse Rate:  [102-121] 115 (10/13  0927) Cardiac Rhythm: Sinus tachycardia (10/13 0700) Resp:  [18-20] 18 (10/13 0927) BP: (114-144)/(71-86) 144/75 (10/13 0927) SpO2:  [94 %-100 %] 97 % (10/13 0927)  CBC: No results for input(s): WBC, NEUTROABS, HGB, HCT, MCV, PLT in the last 168 hours.  Basic Metabolic Panel:   Recent Labs Lab 07/31/16 0347  CREATININE 1.15*    Lipid Panel:     Component Value Date/Time   CHOL 85 07/31/2016 0347   TRIG 106 07/31/2016 0347   HDL 23 (L) 07/31/2016 0347   CHOLHDL 3.7 07/31/2016 0347   VLDL 21 07/31/2016 0347   LDLCALC 41 07/31/2016 0347   HgbA1c:  Lab Results  Component Value Date   HGBA1C 7.7 (H) 07/22/2016   Urine Drug Screen: No results found for: LABOPIA, COCAINSCRNUR, LABBENZ, AMPHETMU, THCU, LABBARB    IMAGING I have personally reviewed the radiological images below and agree with the radiology interpretations.  Mr Dawn [name] Wo Contrast 07/30/2016 1. 5 mm acute infarct in the pons, along the floor of the fourth ventricle.  2. Unchanged marked ventriculomegaly with a communicating hydrocephalus pattern.  3. Atrophy with severe posterior cerebral white matter volume loss.   CT angiogram Head and Neck Mild atherosclerotic disease at both carotid bifurcations but without stenosis. Atherosclerotic disease of the cervical internal carotid arteries just beneath the skullbase. 50% stenosis on the right in 20% stenosis on the left in that location.  Advanced atherosclerotic disease in the carotid siphon regions with narrowing of at least 50% on each side in  that region. Supraclinoid internal carotid arteries are also stenotic.  Anterior and middle cerebral arteries do not show stenosis.  30-50% stenosis at the right vertebral artery origin. Advanced atherosclerotic disease of both vertebral arteries at the foramen magnum level with narrowing estimated at 70% bilaterally. The vessels are patent through that region however. There is no basilar stenosis. No major posterior  circulation branch vessel occlusion or correctable proximal stenosis.  TTE - Left ventricle: The cavity size was normal. There was moderate   concentric hypertrophy. Systolic function was vigorous. The   estimated ejection fraction was in the range of 65% to 70%. Wall   motion was normal; there were no regional wall motion   abnormalities. Due to tachycardia, there was fusion of early and   atrial contributions to ventricular filling. The study is not   technically sufficient to allow evaluation of LV diastolic   function. - Mitral valve: Calcified annulus. - Pericardium, extracardiac: A trivial pericardial effusion was   identified.   PHYSICAL EXAM  Temp:  [97.5 F (36.4 C)-99.7 F (37.6 C)] 98.9 F (37.2 C) (10/13 1358) Pulse Rate:  [102-117] 117 (10/13 1358) Resp:  [18-20] 20 (10/13 1358) BP: (114-144)/(69-86) 127/69 (10/13 1358) SpO2:  [94 %-100 %] 98 % (10/13 1358)  General - Well nourished, well developed, lethargic and sleepy.  Ophthalmologic - Fundi not visualized due to noncooperation.  Cardiovascular - Regular rate and rhythm, but tachycardia.  Neuro - lethargic and sleepy, able to open eyes on voice but not able maintain wakefulness without constant stimulation. Not following commands and no language output during exam. Against forced eye opening, pupil not able to examined. However, pt was able to move eyes on both horizontal directions, with impairment on upward and downward gaze. No significant facial droop. On multiple prompt, she was able to hold both arms against gravity but drift to chair quickly. On pain stimulation, BLEs mild withdraw. Sensation, coordination and gait not tested.   ASSESSMENT/PLAN [name] is a 65 y.o. female with history of previous right CR stroke 2014, tachycardia, NPH, HTN, HLD, DM admitted for L3/4 laminectomy. Post procedure found to have left gaze preference, right-sided inattention and agitation. She did not receive IV t-PA due  to recent surgery.  Stroke:  Right paramedian pontine infarct - secondary to small vessel disease.  Resultant  Horizontal gaze impairment, improved  MRI -  5 mm acute infarct in the pons, along the floor of the fourth ventricle.   CTA H&N - diffuse severe athero including b/l siphon, b/l VAs  2D Echo - EF 65-70%  LDL - 41  HgbA1c - 7.7  VTE prophylaxis - SCDs Diet heart healthy/carb modified Room service appropriate? Yes; Fluid consistency: Thin  aspirin 81 mg daily prior to admission, now on aspirin 325 mg daily. Continue ASA on discharge.  Patient counseled to be compliant with her antithrombotic medications  Ongoing aggressive stroke risk factor management  Therapy recommendations: pending  Disposition: Pending  Follow up with Dr. Leonie Man in clinic  Hypertension  Stable  Permissive hypertension (OK if < 220/120) but gradually normalize in 5-7 days  Long-term BP goal normotensive  Hyperlipidemia  Home meds: Zocor 20 mg daily resumed in the hospital.  LDL 41, goal < 70  Continue statin at discharge  Diabetes  HgbA1c 7.7, goal < 7.0  Uncontrolled  On SSI  On metformin  Close follow up with PCP for better DM control  Other Stroke Risk Factors  Advanced age  Former smoker. Quit  32 years ago.  Obesity, Body mass index is 35.35 kg/m., recommend weight loss, diet and exercise as appropriate   Hx stroke in 2014 due to right CR infarct - small vessel disease  Other Active Problems  Lumbar surgery 07/29/2016.  NPH following with Dr. Leonie Man  Tachycardia - chronic  Mildly elevated creatinine  Hospital day # 3  Neurology will sign off. Please call with questions. Pt will follow up with Dr. Leonie Man at [facility] in about 6 weeks. Thanks for the consult.  Rosalin Hawking, MD PhD Stroke Neurology 07/31/2016 4:26 PM   To contact Stroke Continuity provider, please refer to [email or web address] After hours, contact General Neurology

## 2016-07-31 NOTE — Progress Notes (Signed)
Patient off the floor to CT-no signs/symptoms of pain or discomfort at this time

## 2016-07-31 NOTE — Progress Notes (Deleted)
Patient in bed with son at her bedside. Was given a bath this shift. Son asked when patient will have her swallow eval. Therapy department called, Speech therapist on the way.

## 2016-07-31 NOTE — Progress Notes (Signed)
Patient ID: Dawn [name], female   DOB: 09-03-1951, 65 y.o.   MRN: [id number] Spoke with dr Katherine Roan, neuro, about [facility] cva as well as her NPH. THE PLAN IS FOR HER TO CONTINUE PT/OT AND LATER ON IN 3 TO 6 MONTHS WE CAN TAKE CARE OF HER HYDROCEPHALUS. CONTINUE AS PER NEURO. TO GET REHAB CONSULT

## 2016-07-31 NOTE — Progress Notes (Signed)
Inpatient Rehabilitation  Continuing to follow along for potential IP Rehab admission.  Patient with acute stroke work-up ongoing at this time.  Plan to follow along for timing of medical readiness, insurance authorization, and bed availability.  My co-worker Gerlean Ren to follow up Monday.    Carmelia Roller., CCC/SLP Admission Coordinator  [place]  Cell [phone]

## 2016-08-01 LAB — GLUCOSE, CAPILLARY
GLUCOSE-CAPILLARY: 160 mg/dL — AB (ref 65–99)
GLUCOSE-CAPILLARY: 226 mg/dL — AB (ref 65–99)
GLUCOSE-CAPILLARY: 124 mg/dL — AB (ref 65–99)
Glucose-Capillary: 179 mg/dL — ABNORMAL HIGH (ref 65–99)

## 2016-08-01 LAB — HEMOGLOBIN A1C
Hgb A1c MFr Bld: 7.1 % — ABNORMAL HIGH (ref 4.8–5.6)
MEAN PLASMA GLUCOSE: 157 mg/dL

## 2016-08-01 NOTE — Progress Notes (Signed)
Patient is in bed this morning, ate breakfast without assistance, more alert this morning, asked if she could have something else to eat, offered yogurt, patient states "give me strawberry yogurt"

## 2016-08-01 NOTE — Progress Notes (Signed)
No issues overnight. Reports unchanged back pain. No new N/T/W.  EXAM:  BP 140/79 (BP Location: Left Arm)   Pulse 100   Temp 99.4 F (37.4 C) (Oral)   Resp 18   Ht 5' (1.524 m)   Wt 82.1 kg (181 lb)   SpO2 98%   BMI 35.35 kg/m   Awake, alert, oriented  Speech fluent, appropriate  CN grossly intact  Strength symetric  IMPRESSION:  65 y.o. female s/p lumbar fusion with small pontine postop CVA. Stable for transfer to CIR NPH will be can be treated subacutely  PLAN: - CIR when bed available

## 2016-08-02 LAB — GLUCOSE, CAPILLARY
GLUCOSE-CAPILLARY: 150 mg/dL — AB (ref 65–99)
GLUCOSE-CAPILLARY: 130 mg/dL — AB (ref 65–99)
GLUCOSE-CAPILLARY: 162 mg/dL — AB (ref 65–99)
Glucose-Capillary: 171 mg/dL — ABNORMAL HIGH (ref 65–99)

## 2016-08-02 NOTE — Progress Notes (Signed)
Patient ID: Dawn [name], female   DOB: [birth year], 65 y.o.   MRN: [id number] More awake, moves all 4 extremities. Gaze to left. Oriented x2. . Waiting for rehab

## 2016-08-02 NOTE — Progress Notes (Signed)
No acute events Complains of back pain, as expected Right hemiparesis since stroke, stable per bedside nurse Bandage c/d/i Await rehab

## 2016-08-03 LAB — GLUCOSE, CAPILLARY
GLUCOSE-CAPILLARY: 205 mg/dL — AB (ref 65–99)
GLUCOSE-CAPILLARY: 223 mg/dL — AB (ref 65–99)
GLUCOSE-CAPILLARY: 160 mg/dL — AB (ref 65–99)
Glucose-Capillary: 144 mg/dL — ABNORMAL HIGH (ref 65–99)

## 2016-08-03 NOTE — Progress Notes (Signed)
Inpatient Rehabilitation  I visited the patient at the bedside to discuss her post acute rehab needs. Pt. is unable to carry on a meaningful discussion surrounding rehab plans but agreed to allow me to discuss with her husband.  I phoned Delcie Roch [phone]) to discuss the recommendation for IP Rehab.  I advised him that it is anticipated pt. will need significant assistance following an IP Rehab admission if approved by insurance.  He states he works full time and does not have reliable helpers that would be able to assist him in his wife's care following a shorter term CIR stay.    He desires to pursue SNF despite the benefits of IP Rehab being explained to him.  He does not want to assume the risk of insurance denying SNF coverage if needed on the back side of IP Rehab.  I have discussed with Jacqualin Combes, RNCM and she has texted Crawford Givens, CSW for SNF workup.  Please call if questions.  [place] Admissions Coordinator Cell [phone] Office [phone]

## 2016-08-03 NOTE — Progress Notes (Signed)
Patient ID: Dawn [name], female   DOB: [birth year], 65 y.o.   MRN: [id number] More awake,self-feeding, moves all 4 limbs. Gaze improving. No weakness. Continue as per neuro-stroke. Rehab  consult done

## 2016-08-03 NOTE — Progress Notes (Signed)
Physical Therapy Treatment Patient Details Name: Dawn [name] MRN: [id number] DOB: [birth year] Today's Date: 08/03/2016    History of Present Illness pt is a 65 y/o female with h/o of stroke, NPH, HTN, scoliosis, admitted with bilateral radicular pain, s/p L34 lami, facetectomy and discectomies with PLA and autograft.    PT Comments    Progressing slowly.  Able to maintain focus on therapy without as much redirection through out.  Emphasized sitting balance standing and gait x2  Follow Up Recommendations  CIR     Equipment Recommendations  Other (comment) (TBA)    Recommendations for Other Services Rehab consult     Precautions / Restrictions Precautions Precautions: Back;Fall Required Braces or Orthoses: Spinal Brace Spinal Brace: Lumbar corset;Applied in sitting position Restrictions Weight Bearing Restrictions: No    Mobility  Bed Mobility Overal bed mobility: Needs Assistance Bed Mobility: Rolling;Sidelying to Sit Rolling: Mod assist Sidelying to sit: Mod assist;+2 for physical assistance       General bed mobility comments: cues for technique and to hold attension to task.  lift and stability assist to come up  Transfers Overall transfer level: Needs assistance Equipment used: Rolling walker (2 wheeled) Transfers: Sit to/from Stand Sit to Stand: Min assist;+2 physical assistance         General transfer comment: pt able to pivot with therapist in front of pt and 2nd therapist to Rt holding pt's hand to promote attention to Rt and with significant assist and cuing to keep her focused  Ambulation/Gait Ambulation/Gait assistance: Min assist;+2 safety/equipment Ambulation Distance (Feet): 4 Feet (then additional 6 feet with RW) Assistive device: Rolling walker (2 wheeled) Gait Pattern/deviations: Step-through pattern;Decreased step length - right;Decreased step length - left;Decreased stride length   Gait velocity interpretation: Below normal speed for  age/gender General Gait Details: tood short equal steps with therapist assist for w/shift and moving the RW   Stairs            Wheelchair Mobility    Modified Rankin (Stroke Patients Only) Modified Rankin (Stroke Patients Only) Pre-Morbid Rankin Score: No significant disability Modified Rankin: Severe disability     Balance Overall balance assessment: Needs assistance Sitting-balance support: No upper extremity supported;Single extremity supported Sitting balance-Leahy Scale: Fair Sitting balance - Comments: more stability at EOB     Standing balance-Leahy Scale: Good Standing balance comment: Stood at EOB working on upright posture for 1-2 min with RW                    Cognition Arousal/Alertness: Awake/alert Behavior During Therapy: Flat affect;WFL for tasks assessed/performed Overall Cognitive Status: Impaired/Different from baseline Area of Impairment: Orientation;Attention;Following commands;Awareness Orientation Level: Place;Time;Situation;Disoriented to Current Attention Level: Focused;Sustained Memory: Decreased short-term memory;Decreased recall of precautions Following Commands: Follows one step commands inconsistently   Awareness: Intellectual   General Comments: pt able to maintain focus better today.  Needed redirection and cues to draw attension, but pt respondend to commands and redirection at the end of the session, which has not happened before.    Exercises      General Comments General comments (skin integrity, edema, etc.): pts mentation and attention improving with pt not needing the same level of redirection to stay on task.      Pertinent Vitals/Pain Pain Assessment: Faces Faces Pain Scale: Hurts even more Pain Location: back Pain Descriptors / Indicators: Aching;Sore    Home Living  Prior Function            PT Goals (current goals can now be found in the care plan section) Acute Rehab PT  Goals Patient Stated Goal: I want to get better. PT Goal Formulation: Patient unable to participate in goal setting Potential to Achieve Goals: Fair Progress towards PT goals: Progressing toward goals    Frequency    Min 4X/week      PT Plan Discharge plan needs to be updated    Co-evaluation             End of Session   Activity Tolerance: Patient tolerated treatment well Patient left: in chair;with call bell/phone within reach;with chair alarm set     Time: [id number] PT Time Calculation (min) (ACUTE ONLY): 33 min  Charges:  $Therapeutic Activity: 23-37 mins                    G Codes:      Recia Sons, Tessie Fass 08/03/2016, 3:09 PM 08/03/2016  Donnella Sham, PT [phone]  (pager)

## 2016-08-03 NOTE — Care Management Note (Signed)
Case Management Note  Patient Details  Name: Dawn [name] MRN: [id number] Date of Birth: 29-Dec-1950  Subjective/Objective:                    Action/Plan: Per CIR Pt will not have the support after a CIR stay. Plan is for SNF. CSW made aware. CM following for discharge needs.   Expected Discharge Date:                  Expected Discharge Plan:  [place]  In-House Referral:     Discharge planning Services     Post Acute Care Choice:    Choice offered to:     DME Arranged:    DME Agency:     HH Arranged:    [place] Agency:     Status of Service:  In process, will continue to follow  If discussed at Long Length of Stay Meetings, dates discussed:    Additional Comments:  Pollie Friar, RN 08/03/2016, 11:24 AM

## 2016-08-03 NOTE — Progress Notes (Signed)
Patient A/O, with some confusion. Patient noted she was unaware she had a stroke, educated her on condition. Patient needs assistance with feeding. S

## 2016-08-04 LAB — GLUCOSE, CAPILLARY
GLUCOSE-CAPILLARY: 166 mg/dL — AB (ref 65–99)
GLUCOSE-CAPILLARY: 132 mg/dL — AB (ref 65–99)
Glucose-Capillary: 140 mg/dL — ABNORMAL HIGH (ref 65–99)
Glucose-Capillary: 198 mg/dL — ABNORMAL HIGH (ref 65–99)

## 2016-08-04 NOTE — Progress Notes (Signed)
Alerted by central telemetry patient heart rate dropped to 38 and went into 2nd degree heart block but didn't last very long. EKG obtained results normal copy placed in chart , [place] nurse/Dr [facility] notified no new orders.  Kimbria Camposano, Tivis Ringer, RN

## 2016-08-04 NOTE — Progress Notes (Signed)
Patient ID: Dawn [name], female   DOB: Feb 26, 1951, 65 y.o.   MRN: [id number] While I was in surgery about 5 pm today I was called by the social service to discharge  [name]. Needed to be done before 730 pm because a bed was available. Also the office called with the same info. I did question the emergency of the discharge. I was able to write the summary by 720 pm then I went to the floor and the husband was not happy not knowing why the discharge has to be tonite and not in the morning. Did tell him that I did not know.spoke with a Education officer, museum and expressed the family feelings. I do hope somebody tell the family of why there is a push to send the patient to the nursing home tonite

## 2016-08-04 NOTE — Clinical Social Work Note (Signed)
Clinical Social Work Assessment  Patient Details  Name: Dawn [name] MRN: [id number] Date of Birth: Jan 19, 1951  Date of referral:  08/03/16               Reason for consult:  Facility Placement                Permission sought to share information with:  Family Supports Permission granted to share information::  Yes, Verbal Permission Granted (Patient nodded yes when asked by [place])  Name::     Dawn [name]  Agency::     Relationship::  Husband  Contact Information:  [phone] (c)  Housing/Transportation Living arrangements for the past 2 months:  Single Family Home Source of Information:  Spouse Patient Interpreter Needed:  None Criminal Activity/Legal Involvement Pertinent to Current Situation/Hospitalization:  No - Comment as needed Significant Relationships:  Spouse Lives with:  Spouse Do you feel safe going back to the place where you live?  No (Husband in agreeement with ST rehab as he works during the day) Need for family participation in patient care:  Yes (Comment)  Care giving concerns:  Patient's husband in agreement with rehab for his wife as he works during the day.  Social Worker assessment / plan:  CSW initially visited patient's room and attempted to engage patient in conversation regarding her discharge disposition, however she had respond to [place], had a flat affect and nodded yes when asked if her husband could be contacted. CSW talked with spouse, Dawn [name] by phone regarding recommendation of ST rehab. Husband indicated that patient has never been to [facility] [place] rehab before and he expressed agreement that rehab is a good plan, e since he works during the day and their would be no one at home with his wife. Mr. Roslyn Smiling provided with facility responses and expressed his preference.  CSW also talked with husband regarding his managed care plan and not getting authorization today and talked with him about paying for rehab privately. Mr. Roslyn Smiling indicated that he would not be able to pay for  rehab out-of-pocket and would not have care available for patient while he is at work..  Employment status:  Disabled (Comment on whether or not currently receiving Disability) Insurance information:  Managed Care, Managed Medicare ([employer]) PT Recommendations:  [place] / Referral to community resources:  [place]  Patient/Family's Response to care: Husband did not express any concerns regarding patient's care during hospitalization.  Patient/Family's Understanding of and Emotional Response to Diagnosis, Current Treatment, and Prognosis: Not discussed.  Emotional Assessment Appearance:  Appears older than stated age Attitude/Demeanor/Rapport:  Unable to Assess (Patient did not respond to questions after initially greeting CSW ) Affect (typically observed):  Flat, Quiet Orientation:  Oriented to Self, Oriented to Place (Unsure of orientation to time and situation) Alcohol / Substance use:  Never Used Psych involvement (Current and /or in the community):  No (Comment)  Discharge Needs  Concerns to be addressed:  Discharge Planning Concerns Readmission within the last 30 days:  No Current discharge risk:  None Barriers to Discharge:  Ship broker (Primary insurance is [facility] through husband's job)   Sable Feil, [place] 08/04/2016, 9:25 PM

## 2016-08-04 NOTE — Progress Notes (Signed)
Physical Therapy Treatment Patient Details Name: Dawn [name] MRN: [id number] DOB: 03-27-1951 Today's Date: 08/04/2016    History of Present Illness pt is a 65 y/o female with h/o of stroke, NPH, HTN, scoliosis, admitted with bilateral radicular pain, s/p L34 lami, facetectomy and discectomies with PLA and autograft.    PT Comments    +2 assist required for sit to stands X 3 trials. Continue to progress as tolerated.    Follow Up Recommendations  CIR     Equipment Recommendations  Other (comment) (TBA)    Recommendations for Other Services Rehab consult     Precautions / Restrictions Precautions Precautions: Back;Fall Required Braces or Orthoses: Spinal Brace Spinal Brace: Lumbar corset;Applied in sitting position    Mobility  Bed Mobility Overal bed mobility: Needs Assistance Bed Mobility: Rolling;Supine to Sit Rolling: Max assist   Supine to sit: +2 for physical assistance;Max assist     General bed mobility comments: not assessed this session; up in chair upon arrival  Transfers Overall transfer level: Needs assistance Equipment used: Rolling walker (2 wheeled) Transfers: Sit to/from Stand Sit to Stand: +2 physical assistance;Mod assist;Max assist Stand pivot transfers: +2 physical assistance;Max assist       General transfer comment: sit to stand X1 with 3 trials; first trial pt stood with mod A +2 with RW and multimodal cues for hand placement and technique; second trial unable to stand and 3rd trial attempted to use Stedy but pt unable to achieve standing  Ambulation/Gait                 Stairs            Wheelchair Mobility    Modified Rankin (Stroke Patients Only) Modified Rankin (Stroke Patients Only) Pre-Morbid Rankin Score: No significant disability Modified Rankin: Severe disability     Balance Overall balance assessment: Needs assistance Sitting-balance support: Bilateral upper extremity supported;Feet supported Sitting  balance-Leahy Scale: Poor Sitting balance - Comments: L lateral lean and pushing posterior   Standing balance support: Bilateral upper extremity supported;During functional activity Standing balance-Leahy Scale: Poor Standing balance comment: pt needs cues for upright postuer                    Cognition Arousal/Alertness: Awake/alert Behavior During Therapy: Flat affect;WFL for tasks assessed/performed Overall Cognitive Status: Impaired/Different from baseline Area of Impairment: Orientation;Attention;Following commands;Awareness Orientation Level: Place;Time;Situation;Disoriented to Current Attention Level: Focused;Sustained Memory: Decreased short-term memory;Decreased recall of precautions Following Commands: Follows one step commands inconsistently;Follows one step commands with increased time   Awareness: Intellectual   General Comments: Pt needed redirection to task. pt with L head rotation at EOB and preferred visual gaze    Exercises      General Comments General comments (skin integrity, edema, etc.): pt required multimodal cues to attend to R side and for initation/ attending to tasks      Pertinent Vitals/Pain Pain Assessment: Faces Faces Pain Scale: Hurts little more Pain Location: back Pain Descriptors / Indicators: Operative site guarding;Sore Pain Intervention(s): Limited activity within patient's tolerance;Monitored during session;Premedicated before session;Repositioned    Home Living                      Prior Function            PT Goals (current goals can now be found in the care plan section) Acute Rehab PT Goals Patient Stated Goal: none stated Progress towards PT goals: Progressing toward goals    Frequency  Min 4X/week      PT Plan Current plan remains appropriate    Co-evaluation             End of Session Equipment Utilized During Treatment: Gait belt;Back brace Activity Tolerance: Patient tolerated  treatment well Patient left: in chair;with call bell/phone within reach;with chair alarm set     Time: [phone] PT Time Calculation (min) (ACUTE ONLY): 23 min  Charges:  $Therapeutic Activity: 23-37 mins                    G Codes:      Salina April, PTA Pager: [phone]   08/04/2016, 10:32 AM

## 2016-08-04 NOTE — NC FL2 (Signed)
[place] LEVEL OF CARE SCREENING TOOL     IDENTIFICATION  Patient Name: Dawn [name] Birthdate: August 13, 1951 Sex: female Admission Date (Current Location): 07/28/2016  [facility] and [place] Number:  Herbalist and Address:  [facility], [place] [address], [place], [place] [address]      Provider Number: [id number]  Attending Physician Name and Address:  Leeroy Cha, MD  Relative Name and Phone Number:  Rush,John - Spouse [phone] (h); [phone] (w); [phone] (c)     Current Level of Care: Hospital Recommended Level of Care: [place] Prior Approval Number:    Date Approved/Denied:   PASRR Number: [id number] (Eff. 08/04/16)  Discharge Plan: SNF    Current Diagnoses: Patient Active Problem List   Diagnosis Date Noted  . Surgery, elective   . Diabetes mellitus type 2 in nonobese (HCC)   . Absence of bladder continence   . Vascular dementia without behavioral disturbance   . AKI (acute kidney injury) ([place])   . Lumbar adjacent segment disease with spondylolisthesis 07/28/2016  . NPH (normal pressure hydrocephalus) 06/17/2016  . Dementia 06/17/2016  . Bladder incontinence 06/17/2016  . Acute posthemorrhagic anemia   . Acute GI bleeding 03/19/2015  . Metabolic encephalopathy [id number]  . Hypotension 03/19/2015  . Symptomatic anemia 03/19/2015  . Acute renal failure ([place]) 03/19/2015  . Hypokalemia 03/19/2015  . CT head c/w Normal pressure hydrocephalus 03/19/2015  . GI bleed 03/19/2015  . UTI (urinary tract infection) 09/08/2013  . Hydrocephalus 09/08/2013  . Incontinence 09/08/2013  . Altered mental status 09/08/2013  . Dyslipidemia 05/11/2013  . History of CVA (cerebrovascular accident) 01/11/2013  . Tachycardia 01/10/2013  . Insulin dependent type 2 diabetes mellitus, uncontrolled ([place]) 01/10/2013  . Hypertension 01/10/2013    Orientation RESPIRATION BLADDER Height & Weight     Self,  Situation  O2 (2 Liters Oxygen on 10/17) Continent Weight: 181 lb (82.1 kg) Height:  5' (152.4 cm)  BEHAVIORAL SYMPTOMS/MOOD NEUROLOGICAL BOWEL NUTRITION STATUS      Continent Diet (Heart healthy - carb modified)  AMBULATORY STATUS COMMUNICATION OF NEEDS Skin   Total Care (Patient was unable to amublate with PT) Verbally Other (Comment) (Incision back)                       Personal Care Assistance Level of Assistance  Bathing, Feeding, Dressing Bathing Assistance: Maximum assistance Feeding assistance: Maximum assistance Dressing Assistance: Maximum assistance     Functional Limitations Info  Sight, Hearing, Speech Sight Info: Adequate Hearing Info: Adequate Speech Info: Adequate    SPECIAL CARE FACTORS FREQUENCY  PT (By licensed PT), OT (By licensed OT)     PT Frequency: Evaluated 10/11 and a minimum of 4X per week therapy recommended OT Frequency: Evaluated 10/11 and a minimum of 2X per week therapy recommended            Contractures Contractures Info: Not present    Additional Factors Info  Allergies, Code Status, Insulin Sliding Scale Code Status Info: Full Allergies Info: Penicillin   Insulin Sliding Scale Info: 0-5 Units daily at bedtime; 0-15 Units 3X daily with meals.       Current Medications (08/04/2016):  This is the current hospital active medication list Current Facility-Administered Medications  Medication Dose Route Frequency Provider Last Rate Last Dose  . 0.9 %  sodium chloride infusion  250 mL Intravenous Continuous Leeroy Cha, MD      . 0.9 %  sodium chloride  infusion   Intravenous Continuous Leeroy Cha, MD 75 mL/hr at 07/29/16 1355    . acetaminophen (TYLENOL) tablet 650 mg  650 mg Oral Q4H PRN Leeroy Cha, MD   650 mg at 07/31/16 2106   Or  . acetaminophen (TYLENOL) suppository 650 mg  650 mg Rectal Q4H PRN Leeroy Cha, MD   650 mg at 07/31/16 1854  . amLODipine (NORVASC) tablet 10 mg  10 mg Oral Daily Leeroy Cha, MD    10 mg at 08/04/16 0933  . aspirin chewable tablet 324 mg  324 mg Oral Daily Greta Doom, MD   324 mg at 08/04/16 0933  . chlorhexidine (PERIDEX) 0.12 % solution 15 mL  15 mL Mouth Rinse BID Leeroy Cha, MD   15 mL at 08/04/16 0934  . hydrALAZINE (APRESOLINE) tablet 25 mg  25 mg Oral BID Leeroy Cha, MD   25 mg at 08/04/16 0934  . irbesartan (AVAPRO) tablet 150 mg  150 mg Oral Daily Leeroy Cha, MD   150 mg at 08/04/16 0934   And  . hydrochlorothiazide (MICROZIDE) capsule 12.5 mg  12.5 mg Oral Daily Leeroy Cha, MD   12.5 mg at 08/04/16 0933  . HYDROcodone-acetaminophen (NORCO) 10-325 MG per tablet 1 tablet  1 tablet Oral Q4H PRN Leeroy Cha, MD   1 tablet at 08/04/16 0933  . insulin aspart (novoLOG) injection 0-15 Units  0-15 Units Subcutaneous TID WC Leeroy Cha, MD   2 Units at 08/04/16 [id number]  . insulin aspart (novoLOG) injection 0-5 Units  0-5 Units Subcutaneous QHS Leeroy Cha, MD   2 Units at 07/28/16 2140  . lactated ringers infusion   Intravenous Continuous Finis Bud, MD 50 mL/hr at 07/28/16 0915    . MEDLINE mouth rinse  15 mL Mouth Rinse q12n4p Leeroy Cha, MD   15 mL at 08/03/16 1600  . menthol-cetylpyridinium (CEPACOL) lozenge 3 mg  1 lozenge Oral PRN Leeroy Cha, MD       Or  . phenol (CHLORASEPTIC) mouth spray 1 spray  1 spray Mouth/Throat PRN Leeroy Cha, MD      . metFORMIN (GLUCOPHAGE) tablet 1,000 mg  1,000 mg Oral BID WC Leeroy Cha, MD   1,000 mg at 08/04/16 0934  . ondansetron (ZOFRAN) injection 4 mg  4 mg Intravenous Q4H PRN Leeroy Cha, MD      . simvastatin (ZOCOR) tablet 20 mg  20 mg Oral q1800 Leeroy Cha, MD   20 mg at 08/03/16 1700  . sodium chloride flush (NS) 0.9 % injection 3 mL  3 mL Intravenous Q12H Leeroy Cha, MD   3 mL at 08/04/16 1000  . sodium chloride flush (NS) 0.9 % injection 3 mL  3 mL Intravenous PRN Leeroy Cha, MD         Discharge Medications: Please see discharge summary for a list of  discharge medications.  Relevant Imaging Results:  Relevant Lab Results:   Additional Information [id number].  Patient has Aspen Lumbar Brace at hospital. Surgery 10/11 - Bilateral L3 and L4 laminectomy.  Sable Feil, LCSW

## 2016-08-04 NOTE — Discharge Summary (Addendum)
Physician Discharge Summary  Patient ID: Dawn [name] MRN: [id number] DOB/AGE: [birth year] 65 y.o.  Admit date: 07/28/2016 Discharge date: 08/04/2016  Admission Diagnoses:lumbar spondylolisthesis. Hydrocephalus. DM. CVA brain stem  Discharge Diagnoses:  Active Problems:   Lumbar adjacent segment disease with spondylolisthesis   Surgery, elective   Diabetes mellitus type 2 in nonobese (HCC)   Absence of bladder continence   Vascular dementia without behavioral disturbance   AKI (acute kidney injury) [facility])   [place] [facility] Course: decompression of lumbar spine. CVA brain stem  Consults:neuyrology. rehb  Medicine. hospitalist  Significant Diagnostic Studies:mri brain, spine  Treatments: decompression fusion lumbar spine. Brain stem stroke, non hemorrhagic  Discharge Exam: Blood pressure 101/71, pulse (!) 101, temperature 98.1 F (36.7 C), temperature source Oral, resp. rate 16, height 5' (1.524 m), weight 82.1 kg (181 lb), SpO2 99 %. Awake. F/c. Moves all 4 extremities. Gaze to the left  Disposition: 01-Home or Self Care  Discharge Instructions    Ambulatory referral to Neurology    Complete by:  As directed    Follow up with Dr. Leonie Man at [facility] in 6 weeks. thanks      Follow-up Information    SETHI,PRAMOD, MD. Schedule an appointment as soon as possible for a visit in 6 week(s).   Specialties:  Neurology, Radiology Contact information: [address] [place] [address] [phone]          No current facility-administered medications on file prior to encounter.    Current Outpatient Prescriptions on File Prior to Encounter  Medication Sig Dispense Refill  . amLODipine (NORVASC) 10 MG tablet Take 10 mg by mouth daily.     Marland Kitchen BENICAR HCT 20-12.5 MG per tablet Take 1 tablet by mouth daily.     Marland Kitchen gabapentin (NEURONTIN) 100 MG capsule Take 1 capsule (100 mg total) by mouth 3 (three) times daily. 90 capsule 1  . hydrALAZINE  (APRESOLINE) 25 MG tablet Take 25 mg by mouth 2 (two) times daily.     . Insulin Isophane & Regular Human (HUMULIN 70/30 MIX) (70-30) 100 UNIT/ML PEN Inject 16-28 Units into the skin See admin instructions. Takes 28 units in am and 16 units every evening    . metFORMIN (GLUCOPHAGE) 1000 MG tablet Take 1,000 mg by mouth 2 (two) times daily with a meal.    . Multiple Vitamins-Minerals (MULTIVITAMIN WITH MINERALS) tablet Take 1 tablet by mouth daily.    . simvastatin (ZOCOR) 20 MG tablet Take 1 tablet (20 mg total) by mouth daily at 6 PM. 30 tablet 1  . aspirin 81 MG chewable tablet Chew 1 tablet (81 mg total) by mouth daily. DO NOT TAKE FOR 1 WEEK. May resume on 03/29/2015. (Patient not taking: Reported on 07/21/2016)        Signed: Woodley Petzold M 08/04/2016, 7:16 PM

## 2016-08-04 NOTE — Clinical Social Work Placement (Signed)
   CLINICAL SOCIAL WORK PLACEMENT  NOTE 08/04/16 - DISCHARGED TO [place]  Date:  08/04/2016  Patient Details  Name: Cythina Hershner MRN: [id number] Date of Birth: 03-23-1951  Clinical Social Work is seeking post-discharge placement for this patient at the [place] level of care (*CSW will initial, date and re-position this form in  chart as items are completed):  No (Given facility responses by phone)   Patient/family provided with [place] Work Department's list of facilities offering this level of care within the geographic area requested by the patient (or if unable, by the patient's family).  Yes   Patient/family informed of their freedom to choose among providers that offer the needed level of care, that participate in Medicare, Medicaid or managed care program needed by the patient, have an available bed and are willing to accept the patient.  No   Patient/family informed of [facility]'s ownership interest in [facility] and [facility], as well as of the fact that they are under no obligation to receive care at these facilities.  PASRR submitted to EDS on 08/04/16     PASRR number received on 08/04/16     Existing PASRR number confirmed on       FL2 transmitted to all facilities in geographic area requested by pt/family on 08/04/16     FL2 transmitted to all facilities within larger geographic area on       Patient informed that his/her managed care company has contracts with or will negotiate with certain facilities, including the following:        Yes   Patient/family informed of bed offers received.  Patient chooses bed at [facility]     Physician recommends and patient chooses bed at      Patient to be transferred to [facility] on 08/04/16.  Patient to be transferred to facility by       Patient family notified on 08/04/16 of transfer.  Name of family member notified:  Spouse, Delcie Roch by  phone and at the bedside     PHYSICIAN       Additional Comment:    _______________________________________________ Sable Feil, LCSW 08/04/2016, 9:35 PM

## 2016-08-04 NOTE — Progress Notes (Signed)
Asked by Unit CSW to facilitate late d/c to NH this pm.  CSW spoke with MD re: pt's/husband's strong feelings about d/c this pm, as opposed to in the am.  CSW also spoke with pt/husband who shared concerns about not getting to NH until potentially  tonight dependent on ambulance availability.  CSW discussed with nursing staff and MD on-call was notified.  D/C cancelled for this pm.  Unit CSW to f/u in am. [facility] Admissions Coordinator, Santiago Glad, notified of change in the d/c plan.

## 2016-08-04 NOTE — Progress Notes (Signed)
Patient ID: Dawn [name], female   DOB: 02/04/1951, 65 y.o.   MRN: [id number] Stable. Less responsive but moving all 4 extremities. Rehab will not take her. To get a SNF.

## 2016-08-04 NOTE — Progress Notes (Signed)
Occupational Therapy Treatment Patient Details Name: Dawn [name] MRN: [id number] DOB: 05-01-1951 Today's Date: 08/04/2016    History of present illness  65 y/o female s/p L3-4 lamiecotomy with PLA and autograph. MRI (+)07/30/16 5MM acute infarct Pons along the floor of the fourth ventricle and unchanged ventriculomegaly with communicating hydrocephalus pattern. PMH:    h/o of stroke, NPH, HTN, scoliosis,   OT comments  Pt completed oob to chair in this session. Pt requires total (A) for peri care. Pt requires (A) for feeding in chair by tech.   Follow Up Recommendations  CIR;Supervision/Assistance - 24 hour    Equipment Recommendations  Other (comment)    Recommendations for Other Services Speech consult;Rehab consult    Precautions / Restrictions Precautions Precautions: Back;Fall Required Braces or Orthoses: Spinal Brace Spinal Brace: Lumbar corset;Applied in sitting position       Mobility Bed Mobility Overal bed mobility: Needs Assistance Bed Mobility: Rolling;Supine to Sit Rolling: Max assist   Supine to sit: +2 for physical assistance;Max assist     General bed mobility comments: pt requires (A) to elevate trunk from bed surface. pt attempting posterior lean due to pain. Pt states "OH! my back"  Transfers Overall transfer level: Needs assistance   Transfers: Sit to/from Stand;Stand Pivot Transfers Sit to Stand: +2 physical assistance;Mod assist Stand pivot transfers: +2 physical assistance;Max assist       General transfer comment: bed surface elevated to help with sit <>Stand . pt requires (A) to pivot to chair. pt with decr movement of R LE . pt required max (A) to reposition by 3rd staff to help with transfer.     Balance Overall balance assessment: Needs assistance Sitting-balance support: Bilateral upper extremity supported;Feet supported Sitting balance-Leahy Scale: Poor Sitting balance - Comments: L lateral lean and pushing posterior   Standing  balance support: Bilateral upper extremity supported;During functional activity Standing balance-Leahy Scale: Poor Standing balance comment: pt needs cues for upright postuer                   ADL Overall ADL's : Needs assistance/impaired Eating/Feeding: Total assistance Eating/Feeding Details (indicate cue type and reason): tech in room attempting to feed patient on arrival. pt needs encouragement to engage in eating         Lower Body Bathing: Total assistance;+2 for physical assistance;Sit to/from stand Lower Body Bathing Details (indicate cue type and reason): 2 person static standing and total (A) for peri care Upper Body Dressing : Maximal assistance;Sitting Upper Body Dressing Details (indicate cue type and reason): pt able to thread arms through holes                   General ADL Comments: Pt incontinent of bladder on arrival      Vision                     Perception     Praxis      Cognition   Behavior During Therapy: Flat affect;WFL for tasks assessed/performed Overall Cognitive Status: Impaired/Different from baseline Area of Impairment: Orientation;Attention;Following commands;Awareness Orientation Level: Disoriented to;Person;Place;Time;Situation Current Attention Level: Focused Memory: Decreased recall of precautions;Decreased short-term memory  Following Commands: Follows one step commands inconsistently   Awareness: Intellectual   General Comments: Pt needed redirection to task. pt with L head rotation at EOB and preferred visual gaze    Extremity/Trunk Assessment               Exercises  Shoulder Instructions       General Comments      Pertinent Vitals/ Pain       Pain Assessment: Faces Faces Pain Scale: Hurts little more Pain Location: back Pain Descriptors / Indicators: Operative site guarding Pain Intervention(s): Monitored during session;Premedicated before session;Repositioned  Home Living                                           Prior Functioning/Environment              Frequency  Min 2X/week        Progress Toward Goals  OT Goals(current goals can now be found in the care plan section)  Progress towards OT goals: Progressing toward goals  Acute Rehab OT Goals Patient Stated Goal: I want to get better. OT Goal Formulation: With patient Time For Goal Achievement: 08/12/16 Potential to Achieve Goals: Good ADL Goals Pt Will Perform Grooming: with supervision;sitting Pt Will Transfer to Toilet: with min assist;stand pivot transfer;bedside commode Additional ADL Goal #1: Pt will verbally recall 3/3 back precautions and maintain throughout ADL with min verbal cues. Additional ADL Goal #2: Pt will don/doff back brace with min assist as precursor to ADL and functional mobility.  Plan Discharge plan needs to be updated    Co-evaluation                 End of Session Equipment Utilized During Treatment: Gait belt;Back brace;Oxygen   Activity Tolerance Patient tolerated treatment well   Patient Left in chair;with call bell/phone within reach;with chair alarm set   Nurse Communication Mobility status;Precautions        Time: [phone] OT Time Calculation (min): 15 min  Charges: OT General Charges $OT Visit: 1 Procedure OT Treatments $Self Care/Home Management : 8-22 mins  Parke Poisson B 08/04/2016, 9:33 AM  Jeri Modena   OTR/L Pager: [phone] Office: [phone] .

## 2016-08-05 ENCOUNTER — Telehealth: Payer: Self-pay

## 2016-08-05 LAB — GLUCOSE, CAPILLARY
Glucose-Capillary: 119 mg/dL — ABNORMAL HIGH (ref 65–99)
Glucose-Capillary: 262 mg/dL — ABNORMAL HIGH (ref 65–99)

## 2016-08-05 NOTE — Progress Notes (Signed)
Staples removed from surgical incision to lower back per order patient tolerated well.  Victorio Creeden, Tivis Ringer, RN

## 2016-08-05 NOTE — Telephone Encounter (Signed)
  Notes Recorded by Garvin Fila, MD on 07/31/2016 at 12:53 PM EDT I called patient listed number but unable to leave message as there is no answering machine to communicate EEG results to her  Details

## 2016-08-05 NOTE — Progress Notes (Signed)
Patient ID: Dawn [name], female   DOB: 10-30-1950, 65 y.o.   MRN: [id number] Stable, mentally improving. No weakness. Wound dry. To a nursing home today. Will remove staple  before discharge

## 2016-08-05 NOTE — Progress Notes (Signed)
Patient discharged to [facility] report called to [facility] LPN. Discharge packed faxed to SNF by social work. Patient transported by Corey Harold, family at bedside at time of transport.  Ivo Moga, Tivis Ringer, RN

## 2016-08-06 NOTE — Telephone Encounter (Signed)
I tried calling again same deal no reply and no answering  Machine. EEG is abnormal shows mild generalized slowing which is a nonspecific finding. No seizures

## 2016-08-19 DEATH — deceased

## 2016-08-27 ENCOUNTER — Ambulatory Visit: Payer: Medicare HMO | Admitting: Neurology

## 2016-08-31 ENCOUNTER — Encounter: Payer: Self-pay | Admitting: Neurology

## 2016-10-21 ENCOUNTER — Inpatient Hospital Stay: Admit: 2016-10-21 | Payer: MEDICARE | Primary: Internal Medicine

## 2016-10-21 ENCOUNTER — Encounter

## 2016-10-21 DIAGNOSIS — Z1231 Encounter for screening mammogram for malignant neoplasm of breast: Secondary | ICD-10-CM

## 2017-03-01 ENCOUNTER — Emergency Department: Admit: 2017-03-01 | Payer: MEDICARE | Primary: Internal Medicine

## 2017-03-01 ENCOUNTER — Inpatient Hospital Stay
Admit: 2017-03-01 | Discharge: 2017-03-01 | Disposition: A | Discharge status: Home / Self Care | Payer: MEDICARE | Attending: Emergency Medicine

## 2017-03-01 DIAGNOSIS — F1210 Cannabis abuse, uncomplicated: Secondary | ICD-10-CM

## 2017-03-01 LAB — CBC WITH AUTO DIFFERENTIAL
Basophils %: 0.7 %
Basophils Absolute: 0.1 10*3/uL (ref 0.0–0.2)
Eosinophils %: 0.3 %
Eosinophils Absolute: 0 10*3/uL (ref 0.0–0.7)
Hematocrit: 36.8 % — ABNORMAL LOW (ref 37.0–47.0)
Hemoglobin: 12.3 g/dL (ref 12.0–16.0)
Lymphocytes %: 56.2 %
Lymphocytes Absolute: 4.7 10*3/uL (ref 1.0–4.8)
MCH: 29.9 pg (ref 27.0–31.3)
MCHC: 33.4 % (ref 33.0–37.0)
MCV: 89.5 fL (ref 82.0–100.0)
Monocytes %: 5.7 %
Monocytes Absolute: 0.5 10*3/uL (ref 0.2–0.8)
Neutrophils %: 37.1 %
Neutrophils Absolute: 3.1 10*3/uL (ref 1.4–6.5)
PLATELET SLIDE REVIEW: NORMAL
Platelets: 274 10*3/uL (ref 130–400)
RBC: 4.11 M/uL — ABNORMAL LOW (ref 4.20–5.40)
RDW: 14.2 % (ref 11.5–14.5)
WBC: 8.3 10*3/uL (ref 4.8–10.8)

## 2017-03-01 LAB — COMPREHENSIVE METABOLIC PANEL
ALT: 12 U/L (ref 0–33)
AST: 17 U/L (ref 0–35)
Albumin: 4.4 g/dL (ref 3.9–4.9)
Alkaline Phosphatase: 84 U/L (ref 40–130)
Anion Gap: 16 mEq/L — ABNORMAL HIGH (ref 7–13)
BUN: 20 mg/dL (ref 8–23)
CO2: 23 mEq/L (ref 22–29)
Calcium: 9.3 mg/dL (ref 8.6–10.2)
Chloride: 104 mEq/L (ref 98–107)
Creatinine: 1.16 mg/dL — ABNORMAL HIGH (ref 0.50–0.90)
GFR African American: 56.9 — ABNORMAL LOW (ref >60)
GFR Non-African American: 47 — ABNORMAL LOW (ref >60)
Globulin: 2.1 g/dL — ABNORMAL LOW (ref 2.3–3.5)
Glucose: 124 mg/dL — ABNORMAL HIGH (ref 74–109)
Potassium: 4.1 mEq/L (ref 3.5–5.1)
Sodium: 143 mEq/L (ref 132–144)
Total Bilirubin: 0.3 mg/dL (ref 0.0–1.2)
Total Protein: 6.5 g/dL (ref 6.4–8.1)

## 2017-03-01 LAB — MICROSCOPIC URINALYSIS

## 2017-03-01 LAB — URINE DRUG SCREEN
Amphetamine Screen, Urine: NEGATIVE (ref <1000)
Barbiturate Screen, Ur: NEGATIVE (ref <200)
Benzodiazepine Screen, Urine: NEGATIVE (ref <200)
Cannabinoid Scrn, Ur: POSITIVE — AB (ref <50)
Cocaine Metabolite Screen, Urine: NEGATIVE (ref <300)
Opiate Scrn, Ur: NEGATIVE (ref <300)
PCP Screen, Urine: NEGATIVE (ref <25)

## 2017-03-01 LAB — URINALYSIS
Bilirubin Urine: NEGATIVE
Glucose, Ur: NEGATIVE mg/dL
Ketones, Urine: NEGATIVE mg/dL
Nitrite, Urine: NEGATIVE
Protein, UA: NEGATIVE mg/dL
Specific Gravity, UA: 1.02 (ref 1.005–1.030)
Urobilinogen, Urine: 1 E.U./dL (ref <2.0)
pH, UA: 5.5 (ref 5.0–9.0)

## 2017-03-01 LAB — MAGNESIUM: Magnesium: 2.1 mg/dL (ref 1.7–2.3)

## 2017-03-01 LAB — TROPONIN: Troponin: <0.01 ng/mL (ref 0.000–0.010)

## 2017-03-01 LAB — LACTIC ACID
Lactic Acid: 3.1 mmol/L (ref 0.5–2.2)
Lactic Acid: 2.3 mmol/L — ABNORMAL HIGH (ref 0.5–2.2)

## 2017-03-01 MED ORDER — SODIUM CHLORIDE 0.9 % IV BOLUS
0.9 % | Freq: Once | INTRAVENOUS | Status: AC
Start: 2017-03-01 — End: 2017-03-01
  Administered 2017-03-01: 19:00:00 1000 mL via INTRAVENOUS

## 2017-03-01 MED FILL — SODIUM CHLORIDE 0.9 % IV SOLN: 0.9 % | INTRAVENOUS | Qty: 1000

## 2017-03-01 NOTE — ED Notes (Signed)
Lab notified to draw lactic acid.     Ashley MarinerGina N Evans, RN  03/01/17 [phone]

## 2017-03-01 NOTE — ED Notes (Signed)
Bed: 03  Expected date: 03/01/17  Expected time: 1:54 PM  Means of arrival: [facility]  Comments:  66 year old female syncopal episode     Osker MasonMelissa A [place], RN  03/01/17 1407

## 2017-03-01 NOTE — ED Triage Notes (Signed)
Pt brought by [facility] for c/o poss syncopal episode.  Pt states she drank half of a mikes hard lemonade and "smoked a joint" and believes she passed out at the table.  Per [facility] patients bp upon arrival was 80/50.  One touch was 140.

## 2017-03-01 NOTE — ED Provider Notes (Signed)
[facility]  eMERGENCY dEPARTMENT eNCOUnter      Pt Name: Daisy Orr Favila  MRN: [id number]  Birthdate 04/22/1951  Date of evaluation: 03/01/2017  Provider: Cristie Hemavid M Ida Uppal, MD    CHIEF COMPLAINT       Syncopal episode    HPI  Daisy Orr Liller is a 66 y.o. female per chart review has a h/o COPD, peptic ulcer, and arthritis presents to ED after a syncopal episode that occurred around 1230 at a friend's apartment. Pt states that she spent the morning cleaning houses, did not eat breakfast, drank part of an alcoholic lemonade, and smoked marijuana at her friend's apartment after which her friends informed her she lost consciousness twice. The patient denies regular use of marijuana or alcohol. Pt endorses the use of a vape and denies tobacco use. Pt denies palpitations or light-headedness before the episode. Pt denies any nausea or vomiting. Pt states she felt flushed after the first episode. EMS was called by her friends and they suggested she visit the ED.     ROS  Review of Systems   Constitutional: Negative for activity change, chills and fever.   HENT: Negative for ear pain and sore throat.    Eyes: Negative for visual disturbance.   Respiratory: Negative for cough and shortness of breath.    Cardiovascular: Negative for chest pain, palpitations and leg swelling.   Gastrointestinal: Negative for abdominal pain, diarrhea, nausea and vomiting.   Genitourinary: Negative for dysuria.   Musculoskeletal: Negative for back pain.   Skin: Negative for rash.   Neurological: Positive for syncope. Negative for dizziness and weakness.       Except as noted above the remainder of the review of systems was reviewed and negative.       PAST MEDICAL HISTORY     Past Medical History:   Diagnosis Date   . Arthritis    . Back pain    . COPD (chronic obstructive pulmonary disease) (HCC)    . Ulcer (HCC)          SURGICAL HISTORY     History reviewed. No pertinent surgical history.      CURRENT MEDICATIONS       Previous Medications     ALBUTEROL SULFATE HFA 108 (90 BASE) MCG/ACT INHALER    Inhale 2 puffs into the lungs every 4 hours as needed    CHOLECALCIFEROL 2000 UNITS CAPS    Take 2,000 Units by mouth daily    CYCLOBENZAPRINE (FLEXERIL) 10 MG TABLET    Take 1 tablet by mouth 3 times daily as needed for Muscle spasms    FAMOTIDINE (PEPCID) 20 MG TABLET    Take 20 mg by mouth daily    LEVOTHYROXINE (SYNTHROID) 25 MCG TABLET    Take 25 mcg by mouth daily    VENLAFAXINE (EFFEXOR) 75 MG TABLET    Take 75 mg by mouth 3 times daily    VITAMIN D (ERGOCALCIFEROL) 50000 UNITS CAPS CAPSULE    Take 50,000 Units by mouth once a week       ALLERGIES     Patient has no known allergies.    FAMILY HISTORY     History reviewed. No pertinent family history.       SOCIAL HISTORY       Social History     Social History   . Marital status: Divorced     Spouse name: N/A   . Number of children: N/A   . Years of education: N/A  Social History Main Topics   . Smoking status: Current Every Day Smoker     Types: Cigarettes   . Smokeless tobacco: Never Used   . Alcohol use Yes      Comment: social   . Drug use: Yes     Types: Marijuana   . Sexual activity: Not Asked     Other Topics Concern   . None     Social History Narrative   . None         PHYSICAL EXAM       ED Triage Vitals   BP Temp Temp Source Pulse Resp SpO2 Height Weight   03/01/17 1414 03/01/17 1408 03/01/17 1408 03/01/17 1408 03/01/17 1408 03/01/17 1408 03/01/17 1408 03/01/17 1408   97/67 97.7 F (36.5 C) Oral 107 21 100 % 5' (1.524 m) 130 lb (59 kg)       Physical Exam   Constitutional: She is oriented to person, place, and time. She appears well-developed.   HENT:   Head: Normocephalic.   Right Ear: External ear normal.   Left Ear: External ear normal.   Mouth/Throat: Oropharynx is clear and moist.   Eyes: Conjunctivae are normal. Pupils are equal, round, and reactive to light.   Neck: Normal range of motion. Neck supple.   Cardiovascular: Regular rhythm and normal heart sounds.    Patient mildly  tachycardic.   Pulmonary/Chest: Effort normal and breath sounds normal.   Abdominal: Soft. Bowel sounds are normal. She exhibits no distension. There is no tenderness.   Musculoskeletal: Normal range of motion.   Neurological: She is alert and oriented to person, place, and time.   Skin: Skin is warm and dry.   Psychiatric: She has a normal mood and affect.   Nursing note and vitals reviewed.        MDM  [phone] F presents to ED with syncopal episode. ED course includes 1L IV fluids, CT head, CXR, and labs ordered. Labs remarkable for glucose 124, Cr 1.16, lactic acid 3.1.  UA negative.  Tox positive for THC.  Pt reassessed after 1 L NS and feels much better.  Pt tolerating PO in the ED.  After 1 L NS, lactic acid repeated and it was 2.3.  Pt reassessed and back to baseline.  Pt wanting to go home.  Pt given syncope warning signs and f/u with pcp.  Pt understands plan.             FINAL IMPRESSION      1. Syncope and collapse    2. Marijuana abuse          DISPOSITION/PLAN   DISPOSITION Decision To Discharge 03/01/2017 05:05:02 PM        DISCHARGE MEDICATIONS:  New Prescriptions    No medications on file            Cristie Hem, MD (electronically signed)  Attending Emergency Physician           Cristie Hem, MD  03/01/17 [phone]

## 2017-05-12 IMAGING — CT CT ANGIO HEAD
2 of 12 series · 7 of 47 positions shown · IV contrast (isovue)
Comparison: MRI 07/30/2016.  MR angiography 08/13/2009.

CLINICAL DATA: Brainstem stroke. Chronic hydrocephalus.
Hypertension.

EXAM:
CT ANGIOGRAPHY HEAD AND NECK
TECHNIQUE: Multidetector CT imaging of the head and neck was performed using
the standard protocol during bolus administration of intravenous
contrast. Multiplanar CT image reconstructions and MIPs were
obtained to evaluate the vascular anatomy. Carotid stenosis
measurements (when applicable) are obtained utilizing NASCET
criteria, using the distal internal carotid diameter as the
denominator.
CONTRAST:  50 cc Isovue 370

[Series 7: coronal · coronal · 0.31mm/px · 2 of 67 slices shown]
[im 5/67  brain]
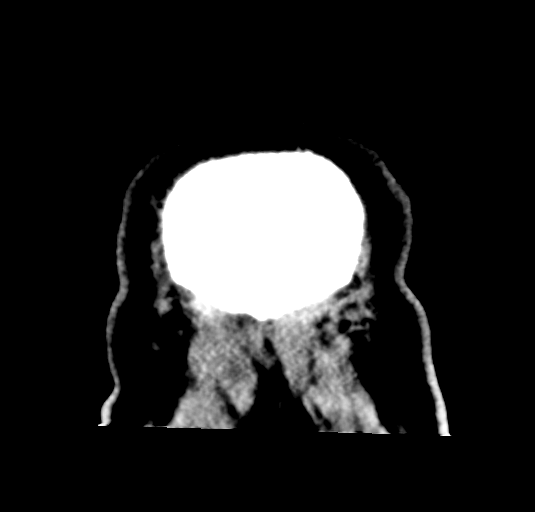
[im 63/67  brain]
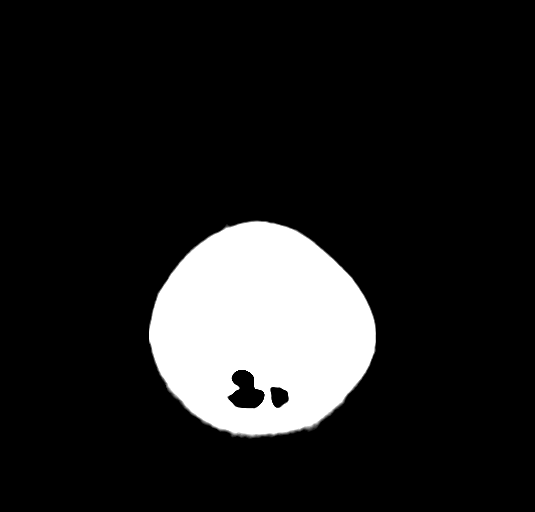

[Series 9: cow 2.0 · axial · 0.56mm/px · z∈[-280,-56]mm · 5 of 168 slices shown]
[im 28/168  brain]
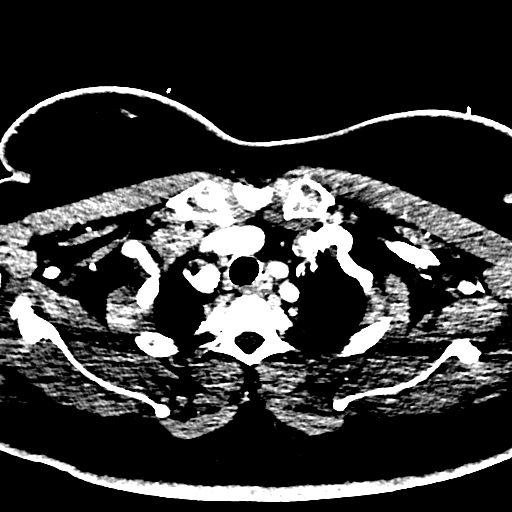
[im 56/168  bone]
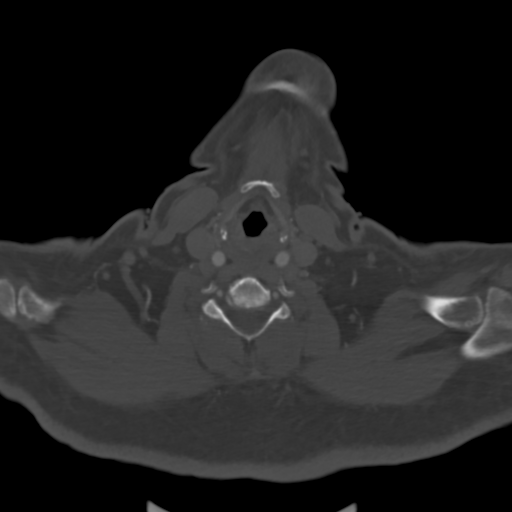
[im 84/168  brain]
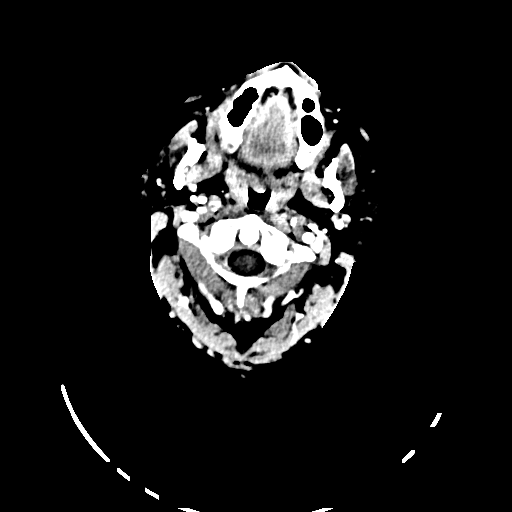
[im 112/168  bone]
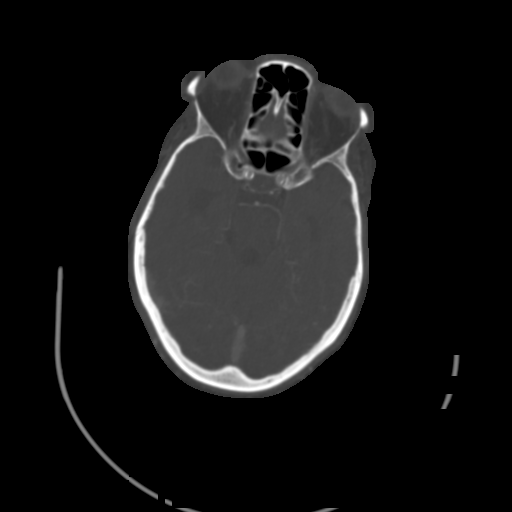
[im 140/168  brain]
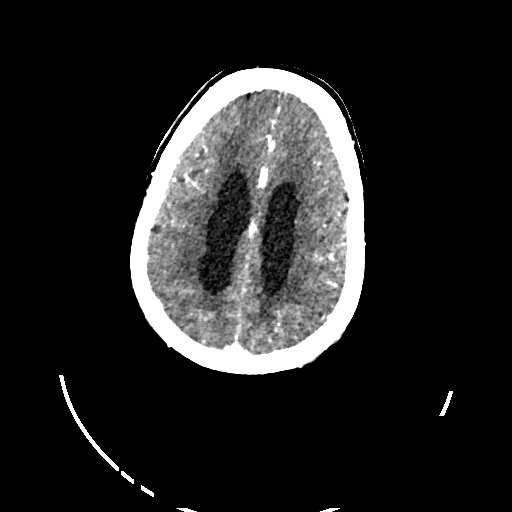

[7 of 47 positions shown; findings below may reference images not displayed]

FINDINGS: CT HEAD FINDINGS

Brain: Generalized brain atrophy again demonstrated with confluent
low density within the deep white matter. Chronic ventriculomegaly
that could be due to a combination of central atrophy and
communicating hydrocephalus. No sign of acute infarction by CT. No
hemorrhage. No extra-axial collection.

Vascular: There is atherosclerotic calcification of the major
vessels at the base of the brain.

Skull: Normal

Sinuses: Clear

Orbits: Normal

Review of the MIP images confirms the above findings

CTA NECK FINDINGS

Aortic arch: Aortic atherosclerosis.  No aneurysm or dissection.

Right carotid system: Common carotid artery widely patent to the
bifurcation. Atherosclerotic disease at the carotid bifurcation but
without stenosis. Cervical ICA shows extensive calcified plaque just
beneath the skullbase with stenosis of 50% in that location.

Left carotid system: Common carotid artery widely patent to the
bifurcation. Atherosclerotic plaque at the carotid bifurcation and
ICA bulb but no stenosis. Calcified plaque of the upper cervical ICA
just beneath the skullbase but with stenosis of only 20%.

Vertebral arteries: Right vertebral artery shows calcified plaque at
the origin with stenosis of 30-50%. Left vertebral artery origin
appears widely patent. Beyond that, both vertebral arteries are
sufficiently patent through the cervical region.

Skeleton: Spondylosis with increased cervical kyphosis.

Other neck: No mass or lymphadenopathy. Multiple thyroid nodules
consistent with goiter.

Upper chest: Lung apices are clear.  No superior mediastinal mass.

Review of the MIP images confirms the above findings

CTA HEAD FINDINGS

Anterior circulation: Both internal carotid artery's show extensive
calcification in the siphon regions. Narrowing is estimated at
50-70% in that region. Supraclinoid internal carotid artery is also
show extensive calcification with narrowing of at least 50%. The
anterior and middle cerebral vessels are patent without proximal
stenosis, aneurysm or vascular malformation.

Posterior circulation: Both vertebral artery show extensive
calcification of the foramen magnum level with stenosis estimated at
70%. Both vessels do show flow beyond that, contributing to the
basilar. No basilar stenosis. Posterior circulation branch vessels
are patent. Posterior inferior cerebellar arteries are patent
bilaterally.

Venous sinuses: Patent and normal

Anatomic variants: Fetal origin left PCA.

Delayed phase: No abnormal enhancement

Review of the MIP images confirms the above findings
IMPRESSION: Mild atherosclerotic disease at both carotid bifurcations but
without stenosis. Atherosclerotic disease of the cervical internal
carotid arteries just beneath the skullbase. 50% stenosis on the
right in 20% stenosis on the left in that location.

Advanced atherosclerotic disease in the carotid siphon regions with
narrowing of at least 50% on each side in that region. Supraclinoid
internal carotid arteries are also stenotic.

Anterior and middle cerebral arteries do not show stenosis.

30-50% stenosis at the right vertebral artery origin. Advanced
atherosclerotic disease of both vertebral arteries at the foramen
magnum level with narrowing estimated at 70% bilaterally. The
vessels are patent through that region however. There is no basilar
stenosis. No major posterior circulation branch vessel occlusion or
correctable proximal stenosis.

## 2018-07-29 ENCOUNTER — Emergency Department: Admit: 2018-07-29 | Payer: MEDICARE | Primary: Internal Medicine

## 2018-07-29 ENCOUNTER — Inpatient Hospital Stay: Admit: 2018-07-29 | Discharge: 2018-07-29 | Disposition: A | Discharge status: Home / Self Care | Payer: MEDICARE

## 2018-07-29 DIAGNOSIS — J209 Acute bronchitis, unspecified: Secondary | ICD-10-CM

## 2018-07-29 LAB — CBC WITH AUTO DIFFERENTIAL
Basophils %: 0.7 %
Basophils Absolute: 0.1 10*3/uL (ref 0.0–0.2)
Eosinophils %: 0.4 %
Eosinophils Absolute: 0 10*3/uL (ref 0.0–0.7)
Hematocrit: 39.9 % (ref 37.0–47.0)
Hemoglobin: 13.5 g/dL (ref 12.0–16.0)
Lymphocytes %: 28.2 %
Lymphocytes Absolute: 2.7 10*3/uL (ref 1.0–4.8)
MCH: 30.2 pg (ref 27.0–31.3)
MCHC: 33.9 % (ref 33.0–37.0)
MCV: 89 fL (ref 82.0–100.0)
Monocytes %: 9.1 %
Monocytes Absolute: 0.9 10*3/uL — ABNORMAL HIGH (ref 0.2–0.8)
Neutrophils %: 61.6 %
Neutrophils Absolute: 5.8 10*3/uL (ref 1.4–6.5)
Platelets: 239 10*3/uL (ref 130–400)
RBC: 4.48 M/uL (ref 4.20–5.40)
RDW: 14.2 % (ref 11.5–14.5)
WBC: 9.5 10*3/uL (ref 4.8–10.8)

## 2018-07-29 LAB — COMPREHENSIVE METABOLIC PANEL
ALT: 10 U/L (ref 0–33)
AST: 16 U/L (ref 0–35)
Albumin: 3.8 g/dL (ref 3.5–4.6)
Alkaline Phosphatase: 76 U/L (ref 40–130)
Anion Gap: 12 mEq/L (ref 9–15)
BUN: 19 mg/dL (ref 8–23)
CO2: 26 mEq/L (ref 20–31)
Calcium: 9.6 mg/dL (ref 8.5–9.9)
Chloride: 100 mEq/L (ref 95–107)
Creatinine: 1.03 mg/dL — ABNORMAL HIGH (ref 0.50–0.90)
GFR African American: >60 (ref >60)
GFR Non-African American: 53.7 — ABNORMAL LOW (ref >60)
Globulin: 3.2 g/dL (ref 2.3–3.5)
Glucose: 80 mg/dL (ref 70–99)
Potassium: 4.5 mEq/L (ref 3.4–4.9)
Sodium: 138 mEq/L (ref 135–144)
Total Bilirubin: <0.2 mg/dL (ref 0.2–0.7)
Total Protein: 7 g/dL (ref 6.3–8.0)

## 2018-07-29 LAB — LACTIC ACID: Lactic Acid: 0.9 mmol/L (ref 0.5–2.2)

## 2018-07-29 LAB — SPECIMEN REJECTION

## 2018-07-29 MED ORDER — AZITHROMYCIN 250 MG PO TABS
250 MG | Freq: Once | ORAL | Status: AC
Start: 2018-07-29 — End: 2018-07-29
  Administered 2018-07-29: 19:00:00 500 mg via ORAL

## 2018-07-29 MED ORDER — PREDNISONE 10 MG PO TABS
10 MG | ORAL_TABLET | Freq: Every day | ORAL | 0 refills | Status: AC
Start: 2018-07-29 — End: 2018-08-03

## 2018-07-29 MED ORDER — AZITHROMYCIN 250 MG PO TABS
250 MG | PACK | ORAL | 0 refills | Status: AC
Start: 2018-07-29 — End: 2018-08-08

## 2018-07-29 MED ORDER — METHYLPREDNISOLONE SODIUM SUCC 125 MG IJ SOLR
125 MG | Freq: Once | INTRAMUSCULAR | Status: AC
Start: 2018-07-29 — End: 2018-07-29
  Administered 2018-07-29: 18:00:00 125 mg via INTRAVENOUS

## 2018-07-29 MED ORDER — IPRATROPIUM-ALBUTEROL 0.5-2.5 (3) MG/3ML IN SOLN
RESPIRATORY_TRACT | Status: DC | PRN
Start: 2018-07-29 — End: 2018-07-29
  Administered 2018-07-29: 18:00:00 1 via RESPIRATORY_TRACT

## 2018-07-29 MED FILL — IPRATROPIUM-ALBUTEROL 0.5-2.5 (3) MG/3ML IN SOLN: 0.5-2.5 (3) MG/3ML | RESPIRATORY_TRACT | Qty: 3

## 2018-07-29 MED FILL — AZITHROMYCIN 250 MG PO TABS: 250 mg | ORAL | Qty: 2

## 2018-07-29 MED FILL — SOLU-MEDROL 125 MG IJ SOLR: 125 mg | INTRAMUSCULAR | Qty: 125

## 2018-07-29 NOTE — ED Provider Notes (Signed)
[facility] ED  EMERGENCY DEPARTMENT ENCOUNTER      Pt Name: Daisy Orr  MRN: [id number]  Birthdate [birth year]  Date of evaluation: 07/29/2018  Provider: Volney Presser, PA-C    CHIEF COMPLAINT       Chief Complaint   Patient presents with   ??? Cough     sore throat, left ear pain for 3 days.           HISTORY OF PRESENT ILLNESS   (Location/Symptom, Timing/Onset,Context/Setting, Quality, Duration, Modifying Factors, Severity)  Note limiting factors.   Daisy Orr is a 67 y.o. female who presents to the emergency department with a chief complaint of a cough.  Patient reports that she had left ear pain, going down to her jaw, and then started developing a cough and congestion associated with it.  She reports that she is mildly short of breath, but denies any chest pain, any fevers, weight loss, or chills.  She denies any hemoptysis.  Patient reports that her cough is mostly dry, with no sputum.  She reports that the cough came about and a gradual onset, and worsened at the most today.  She denies nausea, vomiting, or other symptoms at this time.            Nursing Notes were reviewed.    REVIEW OF SYSTEMS    (2-9 systems for level 4, 10 or more for level 5)     Review of Systems   Constitutional: Negative for activity change, appetite change, fatigue and fever.   HENT: Negative for congestion, ear pain, rhinorrhea, sore throat and trouble swallowing.    Eyes: Negative for pain, discharge and redness.   Respiratory: Positive for cough and shortness of breath. Negative for wheezing.    Cardiovascular: Negative for chest pain and palpitations.   Gastrointestinal: Negative for abdominal pain, blood in stool, constipation, diarrhea, nausea and vomiting.   Endocrine: Negative for polydipsia and polyuria.   Genitourinary: Negative for decreased urine volume, dysuria, flank pain and hematuria.   Musculoskeletal: Negative for arthralgias, back pain and myalgias.   Skin: Negative for color change, rash and wound.    Neurological: Negative for dizziness, syncope, weakness, light-headedness and headaches.   Psychiatric/Behavioral: Negative for behavioral problems.   All other systems reviewed and are negative.      Except as noted above the remainder of the review of systems was reviewed and negative.       PAST MEDICAL HISTORY     Past Medical History:   Diagnosis Date   ??? Arthritis    ??? Back pain    ??? COPD (chronic obstructive pulmonary disease) (HCC)    ??? Ulcer      History reviewed. No pertinent surgical history.  Social History     Socioeconomic History   ??? Marital status: Divorced     Spouse name: None   ??? Number of children: None   ??? Years of education: None   ??? Highest education level: None   Occupational History   ??? None   Social Needs   ??? Financial resource strain: None   ??? Food insecurity:     Worry: None     Inability: None   ??? Transportation needs:     Medical: None     Non-medical: None   Tobacco Use   ??? Smoking status: Current Every Day Smoker     Types: Cigarettes   ??? Smokeless tobacco: Never Used   Substance and Sexual Activity   ???  Alcohol use: Yes     Comment: social   ??? Drug use: Yes     Types: Marijuana   ??? Sexual activity: None   Lifestyle   ??? Physical activity:     Days per week: None     Minutes per session: None   ??? Stress: None   Relationships   ??? Social connections:     Talks on phone: None     Gets together: None     Attends religious service: None     Active member of club or organization: None     Attends meetings of clubs or organizations: None     Relationship status: None   ??? Intimate partner violence:     Fear of current or ex partner: None     Emotionally abused: None     Physically abused: None     Forced sexual activity: None   Other Topics Concern   ??? None   Social History Narrative   ??? None       SCREENINGS             PHYSICAL EXAM    (up to 7 for level 4, 8 or more for level 5)     ED Triage Vitals   BP Temp Temp Source Pulse Resp SpO2 Height Weight   07/29/18 1247 07/29/18 1244 07/29/18  1244 07/29/18 1244 07/29/18 1244 07/29/18 1244 07/29/18 1244 07/29/18 1244   (!) 142/95 98.5 ??F (36.9 ??C) Oral 89 20 97 % 5' (1.524 m) 125 lb (56.7 kg)       Physical Exam   Constitutional: She is oriented to person, place, and time. She appears well-developed and well-nourished. No distress.   HENT:   Head: Normocephalic and atraumatic.   Nose: Nose normal.   Mouth/Throat: Oropharynx is clear and moist.   Eyes: Pupils are equal, round, and reactive to light. Conjunctivae and EOM are normal.   Neck: Normal range of motion. Neck supple.   Cardiovascular: Normal rate, regular rhythm, normal heart sounds and intact distal pulses.   Pulmonary/Chest: Effort normal. She has wheezes in the right upper field, the right middle field, the right lower field, the left upper field, the left middle field and the left lower field.   Mild expiratory wheezes on auscultation.   Abdominal: Soft. Bowel sounds are normal. There is no tenderness.   Neurological: She is alert and oriented to person, place, and time. No cranial nerve deficit.   Skin: Skin is warm and dry. Capillary refill takes less than 2 seconds. No rash noted. She is not diaphoretic.   Psychiatric: Her behavior is normal.   Nursing note and vitals reviewed.      RESULTS     EKG: All EKG's are interpreted by the Emergency Department Physician who either signs or Co-signsthis chart in the absence of a cardiologist.        RADIOLOGY:   Non-plain filmimages such as CT, Ultrasound and MRI are read by the radiologist. Plain radiographic images are visualized and preliminarily interpreted by the emergency physician with the below findings:    NAD    Interpretation per the Radiologist below, if available at the time ofthis note:    XR CHEST STANDARD (2 VW)   Final Result   NO ACUTE CARDIOPULMONARY ABNORMALITY. NO CHANGE.                  ED BEDSIDE ULTRASOUND:   Performed by ED Physician - none  LABS:  Labs Reviewed   CBC WITH AUTO DIFFERENTIAL - Abnormal; Notable for the  following components:       Result Value    Monocytes Absolute 0.9 (*)     All other components within normal limits   CULTURE BLOOD #1   CULTURE BLOOD #2   LACTIC ACID, PLASMA   SPECIMEN REJECTION   LACTIC ACID, PLASMA   COMPREHENSIVE METABOLIC PANEL       All other labs were within normal range or not returned as of this dictation.    EMERGENCY DEPARTMENT COURSE and DIFFERENTIAL DIAGNOSIS/MDM:   Vitals:    Vitals:    07/29/18 1244 07/29/18 1247 07/29/18 1412   BP:  (!) 142/95 (!) 141/89   Pulse: 89  88   Resp: 20  20   Temp: 98.5 ??F (36.9 ??C)     TempSrc: Oral     SpO2: 97%  96%   Weight: 125 lb (56.7 kg)     Height: 5' (1.524 m)              MDM upon evaluating this patient, patient does not appear to be in any acute distress.    This patient could be exhibiting pneumonia, chest x-ray has been ordered to rule out pneumonia along with basic blood work.  Patient is also given a breathing treatment and Solu-Medrol to assist the patient in alleviating her shortness of breath and cough.    I discussed smoking cessation with the patient. I explained that their smoking is worsening their symptoms, and that quitting smoking can help prevent further ER and hospital visits. Discussed options for quitting and advised to follow up with their PCP, but recommended that patient try decreasing their tobacco use by 1 cigarette weekly until done. Quit date is thus variable depending on their started tobacco use. Spent 8 minutes talking    Chest x-ray is negative and laboratory testing is grossly unremarkable.  Patient's repeat evaluation shows no further wheezing.  Patient has an inhaler at home and instructed to use 2 puffs every 4 hours while awake for the next several days.  Patient will be treated with antibiotics and steroids and close PCP follow-up    CRITICAL CARE TIME       CONSULTS:  None    PROCEDURES:  Unless otherwise noted below, none     Procedures    FINAL IMPRESSION      1. Bronchitis, acute, with bronchospasm     2. Tobacco abuse disorder          DISPOSITION/PLAN   DISPOSITION Decision To Discharge 07/29/2018 03:00:26 PM      PATIENT REFERRED TO:  Geryl Rankins, MD  [address]  [place] [address]  [phone]    In 3 days        DISCHARGE MEDICATIONS:  New Prescriptions    AZITHROMYCIN (ZITHROMAX) 250 MG TABLET    Take 2 tablets (500 mg) on Day 1, followed by 1 tablet (250 mg) once daily on Days 2 through 5.    PREDNISONE (DELTASONE) 10 MG TABLET    Take 6 tablets by mouth daily for 5 doses          (Please notethat portions of this note were completed with a voice recognition program.  Efforts were made to edit the dictations but occasionally words are mis-transcribed.)    Volney Presser, PA-C (electronically signed)  Attending Emergency Physician         Volney Presser, PA-C  07/29/18  1502

## 2018-07-29 NOTE — ED Triage Notes (Signed)
Pt has moist productive cough.   Coughing up light yellow secretions.  Pt said she feels short of breath.  Pt has chills and body aches also.   Breath sounds diminished throughout.  Pt awake alert oriented x 3 ambulatory

## 2018-08-03 LAB — CULTURE BLOOD #1: Blood Culture, Routine: NO GROWTH

## 2018-08-03 LAB — CULTURE, BLOOD 2: Culture, Blood 2: NO GROWTH

## 2018-12-08 NOTE — Progress Notes (Signed)
Formatting of this note is different from the original.  Lung Cancer Screening Pulmonary Nodule  Follow-Up    Chief Compliant: Daisy Orr is a pleasant 68 year old female  seen for pulmonary nodule follow up.     HPI:   Daisy Orr is here for follow up of a 62mm RLL nodule seen  On her lung cancer screening scan six months ago.   Patient can tolerate moderate activity. Some SOB on exertion  Patient's appetite is stable and weight is stable  Respiratory symptoms includes SOB on exertion and cough mostly in mornings. These have been chronic.  Denies fever, chills, sweats, fatigue, swelling, joint pain, chest pain.     Patient is a current smoker. Now smoking black and mild cigars. Smoked cigarettes up to one year ago.     PMH and Past Surgical Hx is significant for   PAST MEDICAL HISTORY   Diagnosis Date   ? Annual physical exam 08/27/2014   ? Annual physical exam 11/01/2015    Allen Derry, M.D.   ? Chronic depressive personality disorder     Prior cocaine use.   ? COPD (chronic obstructive pulmonary disease) (HCC)    ? Esophageal reflux    ? H/O bone density study never done   ? H/O mammogram 2013   ? Stomach ulcer    ? Tobacco use disorder      PAST SURGICAL HISTORY   Procedure Laterality Date   ? BIOPSY/REMOVAL, LYMPH NODE(S)     ? NONE       Social History    Tobacco Use: 1 packs/day, for 45 years. Types: Cigarettes      Allergies  ALLERGIES  No Known Allergies       PHYSICAL EXAMINATION:  BP 119/80  Pulse 94  Temp 98  SpO2 98%       General appearance: well appearing, in no acute distress and alert  Skin: skin color, texture, turgor normal, no rashes or lesions  Nose/Sinuses: Negative  Oropharynx: Lips, mucosa, and tongue normal, teeth and gums normal, oropharynx normal  Lymph: Supple, no adenopathy; thyroid symmetric, normal size, no bruits  Respiratory: lungs clear to auscultation no wheezing or rhonchi  Cardiovascular: Negative. RRR without murmur, gallop, or rubs.  No ectopy   Skin: warm, dry and  intact    Data Review:   I have visually reviewed imaging and testing below.    CT reviewed. Awaiting final report    CT 06/02/18: Nodule 1: This Solid nodule is located in the Right Lower Lobe on slice   number 143 with an average diameter of 4.0 mm (5.1 mm x 2.9 mm). New   from priors    Nodule 2: This Solid nodule is located in the Right Middle Lobe on slice   number 200 with an average diameter of 8.6 mm (12.9 mm x 4.2 mm). Stable    Nodule 3: This Non-solid nodule is located in the Right Upper Lobe on   slice number 67 with an average diameter of 4.9 mm (5.9 mm x 3.9 mm).   Stable    Nodule 4: This Solid nodule is located in the Left Lower Lobe on slice   number 203 with an average diameter of 4.5 mm (5.6 mm x 3.4 mm). Stable    Nodule 5:  This Solid nodule is located in the Left Lower Lobe on slice   number 136 with an average diameter of 4.3 mm (4.7 mm x 3.8 mm). Stable  from priors    Assessment and Plan:  1. Pulmonary Nodule: her nodule appears stable on exam. No new nodules are present. Plan is to resume annual screening.     > 30 minutes spent face to face with more than half of this for disease counseling about patient's lung nodule/s.    December 08, 2018  Louretta Parma Race, APRN.CNP    Electronically signed by Normajean Baxter (Cnp) at 12/08/2018  2:22 PM EST

## 2019-01-23 NOTE — Progress Notes (Signed)
Formatting of this note might be different from the original.  TELEPHONE VISIT PROGRESS NOTE    This is a telephone encounter initiated for an established patient, parent or guardian not originating from a related Evaluation & Management service provided within the previous 7 days nor leading to an Evaluation & Management service or procedure within the next 24 hours or soonest available appointment.      Daisy Orr has consented to this telephone encounter.    Persons Present: patient    Chief Complaint/Reason: nasal problem    HPI: She has had problems with nasal congestion which is more significant at night. Her smell sensation is normal.  She has had some allergy symptoms off and on. She has occasional sneezing spells when she is outside.   She was placed on flonase by her pcp but she admits she has not used it regularly and she did not find it helpful.   She has had snoring and some problems breathing at night. She is a smoker and has COPD.     Ct brain done 2018 at [facility] showed no significant sinus disease at that time.     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     Plan:   She will use her flonase regularly. She is instructed on the proper use of her flonase.  She may take PRN antihistamines as needed. She is strongly encouraged to quit smoking.  She will obtain a CD of the images taken for her CT brain done in 2018. She will have follow up in 2 months time or sooner should there be further problems.     Total Time Spent: 11-20 minutes    Darrelyn Hillock, APRN.CNP      Electronically signed by Julian Hy (Cnp) at 01/23/2019  9:16 AM EDT

## 2019-02-08 NOTE — Progress Notes (Signed)
Formatting of this note is different from the original.  Subjective   HPI  Daisy Orr is pleasant  68 year old female who presents for Telephone Visit and F/U 6 Week  This Team Access Model visit is a phone encounter.  It required patient-provider interaction for the medical decision making as documented below.  Health maintenance and preventive screening was discussed and recommended.  Has not been able to schedule colonoscopy yet.  Discussed the lab work done in February and January.  Still continues to smoke.  Dietary modification also discussed.  Pt denies acute chest pain or acute dyspnea.Denies any acute nausea,vomitting or diarrhea.No acute headache or dizziness.No abdominal pain.No hematochezia,malena or hematuria.No recent falls or acute changes in memory or mood.Denies any acute GI/GU symptoms.No acute focal weakness,tingling or numbness in extremities.    PAST MEDICAL HISTORY   Diagnosis Date   ? Annual physical exam 08/27/2014   ? Annual physical exam 11/01/2015    Allen Derry, M.D.   ? Chronic depressive personality disorder     Prior cocaine use.   ? COPD (chronic obstructive pulmonary disease) (HCC)    ? Esophageal reflux    ? H/O bone density study never done   ? H/O mammogram 2013   ? Stomach ulcer    ? Tobacco use disorder      Current Outpatient Medications on File Prior to Visit   Medication Sig   ? fluticasone (FLONASE) 50 mcg/actuation nasal spray Use 1 Spray in each nostril once daily.   ? levothyroxine (SYNTHROID) 25 mcg tablet Take 1 tablet by mouth daily before breakfast.   ? Cholecalciferol, Vitamin D3, (VITAMIN D-3) 50 mcg (2,000 unit) cap Take 1 capsule by mouth once daily.   ? venlafaxine (EFFEXOR) 75 mg tablet Take 1 tablet by mouth three times daily.   ? zoster vaccine, recombinant, adjuvanted, (SHINGRIX, PF,) 50 mcg/0.5 mL injection 2 DOSES 3-4 MONTHS APART   ? albuterol HFA (PROVENTIL HFA, VENTOLIN HFA) 90 mcg/actuation inhaler Inhale 2 Puffs as instructed every 4 hours as  needed.     No current facility-administered medications on file prior to visit.      Review of Systems   Constitutional: Negative.  Negative for chills and fever.   Respiratory: Negative for shortness of breath.    Cardiovascular: Negative for chest pain.   Neurological: Negative for dizziness.         Objective   Physical Exam   ASSESSMENT/PLAN:  1. Hypothyroidism, unspecified type - ICD9: 244.9, ICD10: E03.9 (primary diagnosis)  -  Instructed patient on importance of taking on an empty stomach either first thing in the morning or at bedtime.    -  TSH                 (FOR REMOTE [facility] USE)  -  FREE T4 (FT4)     (FOR REMOTE [facility] USE)    2. Prediabetes - ICD9: 790.29, ICD10: R73.03  Lab work shows evidence of prediabetes.    The risk of developing diabetes is high.    Continue aggressive lifestyle modification with low-fat and low complex carb healthy diet.  Avoid processed foods.  Avoid simple carbohydrates-white rice, white bread, regular pasta, pizza, desserts, ice cream, candies, potatoes, chips, starchy foods etc, Boxed foods).  Increase vegetables and plant based protein in diet.  (may have small quantities/ small portions of brown rice, whole wheat bread, whole wheat pasta)  Choose white meat-chicken or turkey(baked or broiled),fish.  30 minute daily  physical activity.  Adequate by mouth hydration-at least 65 ounces of water unless there is contraindication due to heart failure other medical conditions.  Recheck in 3-6 months    -  HEMOGLOBIN A1C (FOR REMOTE [facility] USE)  -  CMP (CMP)    (FOR REMOTE [facility] USE)    3. Tobacco use disorder - ICD9: 305.1, ICD10: F17.200  - Cessation encouraged.  - Physiologic and physical aspects of tobacco addiction as well as strategies for quitting were discussed.   - Counseling was given focusing on the harmful effects of this addiction especially given the patient's medical condition(s) which will be worsened because of the chemicals in tobacco.    4. 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 Vitamin D deficiency - ICD9: 268.9, ICD10: E55.9    6. Dyslipidemia - ICD9: 272.4, ICD10: E78.5    -  LIPID PANEL (LIPB)    (FOR REMOTE [facility] USE)    7. 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

## 2019-02-15 NOTE — Progress Notes (Signed)
Formatting of this note is different from the original.  [facility] VISIT  PATIENT NAME: Daisy Orr  CLINIC NO: [id number]  ATTENDING PHYSICIAN: Celesta Aver, MD.  DATE OF SERVICE: 02/15/2019    This visit is a phone encounter. A video connection was not possible because of technical difficulties.  It required patient-provider interaction for the medical decision making as documented below.    Persons Present: patient    Daisy Orr is a 68 year old female seen for lymphocytosis.   she has consented to this distance health encounter. Patient is new to me.    HISTORY REVIEWED (electronic chart updated):  Patient is referred by Dr. Geryl Rankins for evaluation of lymphocytosis. She has a history of prediabetes, hypothyroidism, depression, COPD, prior drug abuse, GERD and PUD. She is a current smoker (cigarettes and marijuana).     Data Reviewed: Most recent labs:  C-E:      07/29/18   03/01/18  WBC  9.5 8.3  RBC  4.48 4.11  Hemoglobin 13.5 12.3  Hematocrit 39.9 36.8  MCV  89.5 89.5  Platelets 239 274  Neutrophils% 61.6 37.1  Lymphocytes% 28.2 56.2  Monocytes % 9.1 5.7  Eosinophils % 0.4 0.3  Basophils % 0.7 0.7  Neutrophils # 5.8 3.1  Lymphocytes # 2.7 4.7  Monocytes # 0.9 0.5  Eosinophils # 0.0 0.0  Basophils # 0.1 0.1    [facility]:  Component  07/13/2007 09/27/2014 08/01/2015 09/04/2016 11/16/2018 12/14/2018   WBC 3.70 - 11.00 k/uL 9.13 6.90 6.10 7.10 7.07 7.88   RBC 3.90 - 5.20 m/uL 4.20 4.54 4.82 4.30 4.77 4.39   Hemoglobin 11.5 - 15.5 g/dL 27.6 70.1 10.0 34.9 61.1 12.9   Hematocrit 36.0 - 46.0 % 36.9 (L) 40.5 44.0 38.8 42.3 39.6   MCV 80.0 - 100.0 fL 87.9 89.2 91.2 90.2 88.7 90.2   MCH 26.0 - 34.0 pG 29.0 30.8 28.2 29.8 29.8 29.4   MCHC 30.5 - 36.0 g/dL 64.3 53.9 12.2 (L) 58.3 33.6 32.6   RDW-CV 11.5 - 15.0 % 14.6 14.1 (H) 14.8 (H) 14.3 15.1 (H) 14.7   Platelet Count 150 - 400 k/uL 212 284 258 256 301 265   MPV 9.0 - 12.7 fL 11.1 10.4 9.6 10.9 10.9 10.9   Neut% % 65.7 50.3 35.1 (L) 33.1 34.2 34.6    Abs Neut (ANC) 1.45 - 7.50 k/uL 6.00 3.50 2.10 2.35 2.42 2.73   Lymph% % 21.6 (L) 45.2 (H) 59.7 (H) 59.2 57.1 56.6   Abs Lymph 1.00 - 4.00 k/uL 1.97 3.10 3.60 (H) 4.20 (H) 4.04 (H) 4.46 (H)   Mono% % 12.5 (H)   6.6 7.8 7.9   Abs Mono <0.87 k/uL 1.14 (H)   0.47 0.55 0.62   Eosin% % 0.1   0.8 0.6 0.6   Abs Eosin <0.46 k/uL 0.01   0.06 0.04 0.05   Baso% % 0.1   0.3 0.3 0.3   Abs Baso <0.11 k/uL 0.01   <0.03 <0.03 <0.03   MXD% 1.0 - 18.0 %  4.5 5.2      MXD ABS 0.00 - 1.80 K/uL  0.30 0.30        REVIEW OF SYSTEMS:  GENERAL: feeling well without fatigue, no recent change in weight  HEENT: denies HA, change in hearing or vision, no other ENT complaints  NECK: denies swelling or pain in neck  RESPIRATORY: no cough, no wheezing or shortness of breath  CARDIOVASCULAR: no chest pain, no palpitations  GI: normal appetite, tolerating PO well, BMs normal and no abdominal pain  GU: urination is normal  GYN: postmenopausal, denies vaginal bleeding  MUSCULOSKELETAL: low back pain as noted above  SKIN: no rash  PSYCH: sleep is normal  HEMATOLOGY/LYMPHOLOGY: negative for prolonged bleeding, no swollen lymph nodes  ENDOCRINE: denies cold/heat intolerance, denies polyuria or polydipsia, no goiter  NEURO: admits to paresthesia in the right arm and hand  All other ROS: negative    Current Outpatient Medications   Medication Sig Dispense Refill   ? fluticasone (FLONASE) 50 mcg/actuation nasal spray Use 1 Spray in each nostril once daily. 1 Bottle 2   ? levothyroxine (SYNTHROID) 25 mcg tablet Take 1 tablet by mouth daily before breakfast. 90 tablet 1   ? Cholecalciferol, Vitamin D3, (VITAMIN D-3) 50 mcg (2,000 unit) cap Take 1 capsule by mouth once daily. 90 capsule 1   ? venlafaxine (EFFEXOR) 75 mg tablet Take 1 tablet by mouth three times daily. 270 tablet 1   ? zoster vaccine, recombinant, adjuvanted, (SHINGRIX, PF,) 50 mcg/0.5 mL injection 2 DOSES 3-4 MONTHS APART 1 Each 1   ? albuterol HFA (PROVENTIL HFA, VENTOLIN HFA) 90  mcg/actuation inhaler Inhale 2 Puffs as instructed every 4 hours as needed. 1 Inhaler 3     No current facility-administered medications for this visit.      ALLERGIES  No Known Allergies    PAST MEDICAL HISTORY  ? Chronic depressive personality disorder    Prior cocaine use.  ? COPD (chronic obstructive pulmonary disease) (HCC)   ? Esophageal reflux   ? H/O bone density study never done  ? H/O mammogram 2013  ? Stomach ulcer   ? Tobacco use disorder     PAST SURGICAL HISTORY  ? BIOPSY/REMOVAL, LYMPH NODE(S)      FAMILY HISTORY   ? Cancer Mother        ? type-lung cancer-alcohlic and smoker  ? Cancer Father        mesothelioma  ? Cancer Sister        lung  ? Breast Cancer Maternal Aunt   ? Cancer Sister        stomach  ? Cancer Other        lung    Social History  Tobacco Use  ? Smoking status: Current Every Day Smoker    Packs/day: 1.00    Years: 45.00    Pack years: 45.00    Types: Cigarettes  ? Smokeless tobacco: Never Used  Substance Use Topics  ? Alcohol use: No  ? Drug use: Yes    Types: Marijuana    Comment: recovering, used Cocaine - current marijuana use    PHYSICAL EXAMINATION:    VIDEO EXAM:  (if done, performed via video enabled technology)    No exam performed    ASSESSMENT:     (Z61.096(D72.820) Lymphocytosis--suspect secondary to smoking but need to rule out a lymphoproliferative disorder    PLAN:    --Peripheral blood flow cytometry. I asked Ms. Mcphail to call the office 1 week after her blood is drawn to discuss the results by phone.    Total Time Spent: 21-30 minutes In coordination of care and discussion of the causes of lymphocytosis.    Celesta AverBernard J Silver, MD    Cc.   Geryl RankinsKHATRI, SALONI, MD      Address: [place] Phone: [phone]   City/State/Zip: [place]/[address] Fax: [phone]       Electronically signed by Celesta AverSilver, Bernard J  at 02/15/2019  1:50 PM EDT

## 2019-03-02 NOTE — Telephone Encounter (Signed)
Formatting of this note might be different from the original.  Called and LMOM to schedule procedure - will follow up.  Electronically signed by Della Goo, Christina at 03/02/2019 10:57 AM EDT

## 2019-03-14 NOTE — Telephone Encounter (Signed)
Formatting of this note might be different from the original.  Called and LMOM to schedule procedure, per message below. Will follow up  Electronically signed by Della Goo, Trula Ore at 03/14/2019  1:38 PM EDT

## 2019-03-16 NOTE — Telephone Encounter (Signed)
Formatting of this note might be different from the original.  Patient states that she will not do the colonoscopy - patient states that they would like to do cologuard test.   Electronically signed by Della Goo, Christina at 03/16/2019  2:28 PM EDT

## 2019-03-16 NOTE — Telephone Encounter (Signed)
Formatting of this note might be different from the original.  Please send the kit  Electronically signed by Juliane Lack at 03/16/2019  3:52 PM EDT

## 2019-03-17 NOTE — Telephone Encounter (Signed)
Formatting of this note might be different from the original.  Order pending. Please sign.     Thornton Dales MA  Mar 17, 2019  8:41 AM      Electronically signed by Lehman Prom), MA at 03/17/2019  8:41 AM EDT

## 2019-03-17 NOTE — Addendum Note (Signed)
Addended by: Lehman Prom on: 03/17/2019 08:41 AM     Modules accepted: Orders      Electronically signed by Thornton Dales (Ma), MA at 03/17/2019  8:41 AM EDT

## 2019-03-17 NOTE — Addendum Note (Signed)
Addended by: Geryl Rankins MD on: 03/17/2019 12:36 PM     Modules accepted: Orders      Electronically signed by Geryl Rankins at 03/17/2019 12:36 PM EDT

## 2019-03-17 NOTE — Telephone Encounter (Signed)
Formatting of this note might be different from the original.  Stool kit placed in outgoing mail to be sent to pt's home address. Directions included.     Lujean Amel MA  Mar 17, 2019  2:02 PM      Electronically signed by Levonne Hubert), MA at 03/17/2019  2:02 PM EDT

## 2020-08-23 ENCOUNTER — Encounter: Payer: Self-pay | Admitting: Gastroenterology

## 2021-01-23 LAB — FECAL DNA COLORECTAL CANCER SCREENING (COLOGUARD): FIT-DNA (Cologuard): NEGATIVE

## 2022-08-12 NOTE — Telephone Encounter (Signed)
Formatting of this note is different from the original.  [facility] requesting refills via fax as follows:      RX INSTRUCTIONS:  Pharmacy initiated this request.  No need to notify patient.    Requested Prescriptions     Pending Prescriptions Disp Refills    tiZANidine (ZANAFLEX) 2 mg tablet 90 tablet 0     Sig: Take 1 tablet by mouth at bedtime as needed.     Lujean Amel, [place]  August 12, 2022  12:35 PM      Electronically signed by Lujean Amel, MA at 08/12/2022 12:35 PM EDT

## 2022-08-13 NOTE — Progress Notes (Signed)
Formatting of this note is different from the original.    Subjective:    Patient ID: Daisy Orr is a 71 y.o. female.    Chief Complaint   Patient presents with   ? Other Chief Complaint     HPI   71 year old presents for a new onset of cough for the past 3 weeks. The patient has a history of COPD in the past. She has noticed increased dyspnea, increased sputum production, increased volume of sputum. Is a tobacco user.     Respiratory  Patient presents with: cough    Chronicity:  New  Onset:  3 weeks  Frequency:  Constant  Timing:  In the morning, in the afternoon and in the evening  Severity:  Mild  Exposure to:  Sick contacts  Cough characteristics:  Productive and productive of sputum  Treatments tried:  OTC medications (mucinex)  Exacerbated by: smoking.  Response to treatment:  No relief  Associated symptoms: congestion, shortness of breath, cough, sneezing and rhinorrhea    Associated symptoms: no decreased appetite, no chest pain, no chest tightness, no ear pain, no fever, no headaches, no malaise, no fatigue, no myalgias, no postnasal drip, no nocturnal dyspnea, no sore throat, no sweats, no unplanned weight loss, no sinus pain, no rash, no urticaria, no abdominal pain, no itching, no swelling, no drooling, no trouble swallowing, no vomiting, no syncope, no swollen glands and no wheezing    Risk Factors: suspected exposure to illness or sick contacts    Recent travel:  None  History of asthma: No    History of COPD: Yes    COPD Related Questions:     Onset of symptoms:  3 weeks    Duration of symptoms:  3 weeks    Has patient had 2 or more exacerabtions in the past year: No      Has the patient had 1 or more COPD related hospitalizations in the past year?: No      Cough reported?:  Yes    Has cough increased in frequency or severity?: Yes      Has sputum production increased in volume?:  Yes    Sputum characteristics:  White and green    Has sputum production changed in color?:  Yes    Amount of  sputum:  Moderate    Dyspnea: Yes      Has dyspnea increased?: Yes      Number of pillows used at night:  2    Other symptoms: changes in energy      Symptoms aggravated by:  Activity    Currently on home oxygen?: No        Review of Systems   Constitutional: Negative for decreased appetite, fatigue and fever.   HENT: Positive for congestion, rhinorrhea and sneezing. Negative for drooling, ear pain, postnasal drip, sinus pain, sore throat and trouble swallowing.    Respiratory: Positive for cough and shortness of breath. Negative for chest tightness and wheezing.    Cardiovascular: Negative for chest pain and syncope.   Gastrointestinal: Negative for abdominal pain and vomiting.   Musculoskeletal: Negative for myalgias.   Skin: Negative for itching and rash.   Neurological: Negative for headaches.     Social History     Tobacco Use   Smoking Status Every Day   ? Types: Cigarettes   Smokeless Tobacco Never     Past Medical History:   Diagnosis Date   ? COPD (chronic obstructive pulmonary disease)    ?  Depression    ? GERD (gastroesophageal reflux disease)    ? HLD (hyperlipidemia)    ? Hypothyroidism    ? Low vitamin D level    ? Lung nodules    ? Osteopenia    ? Prediabetes    ? Tobacco use      Past Surgical History:   Procedure Laterality Date   ? LYMPH NODE BIOPSY       History reviewed. No pertinent family history.  Objective:    Vitals:    08/13/22 1619   BP: 102/64   Pulse: 79   Resp: 18   Temp: 98 F (36.7 C)   SpO2: 96%           Physical Exam  Constitutional:       General: She is not in acute distress.     Appearance: Normal appearance. She is not ill-appearing, toxic-appearing or diaphoretic.   HENT:      Head: Normocephalic and atraumatic.      Right Ear: Tympanic membrane, ear canal and external ear normal. There is no impacted cerumen.      Left Ear: Tympanic membrane, ear canal and external ear normal. There is no impacted cerumen.      Nose: Congestion and rhinorrhea present.      Mouth/Throat:       Mouth: Mucous membranes are moist.      Pharynx: No oropharyngeal exudate or posterior oropharyngeal erythema.   Eyes:      General:         Right eye: No discharge.         Left eye: No discharge.      Extraocular Movements: Extraocular movements intact.      Conjunctiva/sclera: Conjunctivae normal.   Cardiovascular:      Rate and Rhythm: Normal rate and regular rhythm.      Pulses: Normal pulses.      Heart sounds: Normal heart sounds.   Pulmonary:      Effort: No tachypnea, bradypnea, accessory muscle usage, prolonged expiration, respiratory distress or retractions.      Breath sounds: No stridor, decreased air movement or transmitted upper airway sounds. Examination of the right-upper field reveals wheezing. Examination of the left-upper field reveals wheezing. Examination of the right-middle field reveals wheezing. Examination of the left-middle field reveals wheezing. Examination of the right-lower field reveals wheezing and rhonchi. Examination of the left-lower field reveals wheezing and rhonchi. Wheezing and rhonchi present. No decreased breath sounds or rales.      Comments: Inspiratory and expiratory wheezing present   Chest:      Chest wall: No tenderness.   Musculoskeletal:      Cervical back: Normal range of motion.   Lymphadenopathy:      Cervical: No cervical adenopathy.   Skin:     General: Skin is warm.      Capillary Refill: Capillary refill takes less than 2 seconds.      Findings: No rash.   Neurological:      General: No focal deficit present.      Mental Status: She is alert and oriented to person, place, and time.   Psychiatric:         Mood and Affect: Mood normal.         Behavior: Behavior normal.           Assessment/Plan:    HPI provided by Self    Based on today's visit:history and physical exam only, as no relevant testing deemed  necessary  patient's visit diagnosis is/includes   1. COPD with acute exacerbation      Patient has a history of chronic conditions and those listed in the  visit diagnoses were reviewed today. They are currently unstable on medications.    Treatment plan includes:   Orders Placed:  Orders Placed This Encounter   Procedures   ? NEBULIZER ADMINISTRATIVE SET, WITH SMALL VOLUME NONFILTERED   ? AEROSOL MASK, USED WITH DME NEBULIZER   ? X-ray chest 2 views     Medications ordered this visit     Signed Prescriptions Disp Refills   ? predniSONE (DELTASONE) 20 MG tablet 14 tablet 0     Sig: Take 2 tablets (40 mg total) by mouth daily for 7 days   ? azithromycin (ZITHROMAX) 250 MG tablet 6 tablet 0     Sig: Take 2 tablets on day 1, Take 1 tablet on Day 2-5   ? albuterol (PROVENTIL) 2.5 mg /3 mL (0.083 %) nebulizer solution 75 mL 0     Sig: Take 3 mL (2.5 mg total) by nebulization every 6 (six) hours as needed for wheezing Breathe as calmly, deeply and evenly as possible through mouth until no more mist is formed in the nebulizer chamber (about 5 to 15 minutes)..   ? nebulizers misc 1 each 0     Sig: Use as directed     Current medication list and any new medications prescribed or recommended today were reviewed with the patient and specific instructions were provided Yes    Provider Recommendations     Follow up care instructions were provided and reviewed?with the  Patient. All questions were answered. Patient verbalized understanding of plan of care today.                        1. COPD with acute exacerbation  - predniSONE (DELTASONE) 20 MG tablet; Take 2 tablets (40 mg total) by mouth daily for 7 days  Dispense: 14 tablet; Refill: 0  - azithromycin (ZITHROMAX) 250 MG tablet; Take 2 tablets on day 1, Take 1 tablet on Day 2-5  Dispense: 6 tablet; Refill: 0  - albuterol (PROVENTIL) 2.5 mg /3 mL (0.083 %) nebulizer solution; Take 3 mL (2.5 mg total) by nebulization every 6 (six) hours as needed for wheezing Breathe as calmly, deeply and evenly as possible through mouth until no more mist is formed in the nebulizer chamber (about 5 to 15 minutes)..  Dispense: 75 mL; Refill: 0  -  nebulizers misc; Use as directed  Dispense: 1 each; Refill: 0  - NEBULIZER ADMINISTRATIVE SET, WITH SMALL VOLUME NONFILTERED  - AEROSOL MASK, USED WITH DME NEBULIZER  - X-ray chest 2 views    -Patient will schedule appt with Dr. Shelly Coss for a maintenance inhaler for her COPD ie LABA or LAMA or LAMA/LABA. Patient states that she would like to discuss with Dr. Shelly Coss     -She will RTC in 4 days to reassess her symptoms    -Will discuss at next appt vaccinations to make sure she is up to date     Instructed pt to RTC or PCP if s/s persist or worsen. Reinforced red flags including (but not limited to): severe or worsening abdominal pain, difficulty swallowing, stiff neck, shortness of breath, coughing or vomiting blood, chest pain, increased fever, unexplained weight loss or blood in stool and indications to go to the ED. Instructed pt to take all medications as prescribed. Care Instructions  were reviewed with the patient. All questions answered and patient verbalized understanding of the plan of care for today.                           Electronically signed by Benjamine Sprague, NP at 08/13/2022  4:48 PM EDT

## 2022-08-14 ENCOUNTER — Inpatient Hospital Stay: Admit: 2022-08-14 | Payer: MEDICARE | Primary: Internal Medicine

## 2022-08-14 ENCOUNTER — Encounter

## 2022-08-14 DIAGNOSIS — J441 Chronic obstructive pulmonary disease with (acute) exacerbation: Secondary | ICD-10-CM

## 2022-11-03 ENCOUNTER — Encounter: Payer: Self-pay | Admitting: Gastroenterology

## 2023-07-27 ENCOUNTER — Inpatient Hospital Stay
Admit: 2023-07-27 | Discharge: 2023-07-27 | Disposition: A | Discharge status: Home / Self Care | Payer: MEDICARE | Attending: Student in an Organized Health Care Education/Training Program

## 2023-07-27 ENCOUNTER — Emergency Department: Admit: 2023-07-27 | Payer: MEDICARE | Primary: Internal Medicine

## 2023-07-27 DIAGNOSIS — M5441 Lumbago with sciatica, right side: Secondary | ICD-10-CM

## 2023-07-27 DIAGNOSIS — M5431 Sciatica, right side: Secondary | ICD-10-CM

## 2023-07-27 MED ORDER — LIDOCAINE 4 % EX PTCH
4 | Freq: Every day | CUTANEOUS | 0 refills | Status: AC
Start: 2023-07-27 — End: ?

## 2023-07-27 MED ORDER — PREDNISONE 50 MG PO TABS
50 | ORAL_TABLET | Freq: Every day | ORAL | 0 refills | Status: DC
Start: 2023-07-27 — End: 2023-07-27

## 2023-07-27 MED ORDER — PREDNISONE 20 MG PO TABS
20 | Freq: Once | ORAL | Status: AC
Start: 2023-07-27 — End: 2023-07-27
  Administered 2023-07-27: 20:00:00 50 mg via ORAL

## 2023-07-27 MED ORDER — NAPROXEN 500 MG PO TABS
500 | ORAL_TABLET | Freq: Two times a day (BID) | ORAL | 3 refills | Status: DC | PRN
Start: 2023-07-27 — End: 2023-07-27

## 2023-07-27 MED ORDER — NAPROXEN 500 MG PO TABS
500 MG | ORAL_TABLET | Freq: Two times a day (BID) | ORAL | 3 refills | Status: DC | PRN
Start: 2023-07-27 — End: 2023-08-27

## 2023-07-27 MED ORDER — LIDOCAINE 4 % EX PTCH
4 | Freq: Every day | CUTANEOUS | 0 refills | Status: DC
Start: 2023-07-27 — End: 2023-07-27

## 2023-07-27 MED ORDER — LIDOCAINE 4 % EX PTCH
4 | CUTANEOUS | Status: DC
Start: 2023-07-27 — End: 2023-07-27
  Administered 2023-07-27: 20:00:00 1 via TRANSDERMAL

## 2023-07-27 MED ORDER — KETOROLAC TROMETHAMINE 30 MG/ML IJ SOLN
30 | Freq: Once | INTRAMUSCULAR | Status: AC
Start: 2023-07-27 — End: 2023-07-27
  Administered 2023-07-27: 20:00:00 30 mg via INTRAMUSCULAR

## 2023-07-27 MED ORDER — PREDNISONE 50 MG PO TABS
50 | ORAL_TABLET | Freq: Every day | ORAL | 0 refills | Status: AC
Start: 2023-07-27 — End: 2023-07-31

## 2023-07-27 MED FILL — KETOROLAC TROMETHAMINE 30 MG/ML IJ SOLN: 30 MG/ML | INTRAMUSCULAR | Qty: 1

## 2023-07-27 MED FILL — ASPERCREME LIDOCAINE 4 % EX PTCH: 4 % | CUTANEOUS | Qty: 1

## 2023-07-27 MED FILL — PREDNISONE 20 MG PO TABS: 20 MG | ORAL | Qty: 3

## 2023-07-27 NOTE — Discharge Instructions (Addendum)
You are seen in the ER today due to right lower back pain going down the right leg.  At this time, x-ray imaging does not reveal a fracture of your right hip.  I am concerned that you likely have sciatica which is a pinched nerve causing pain down the right leg.  At this time I will start you on steroids for the next 4 days to help with inflammation.  I will prescribe you naproxen for pain control at home.  I will also prescribe you lidocaine patches.  Please continue on your Zanaflex muscle relaxer.  Please follow-up with your PCP for outpatient reevaluation.  If you develop any worsening pain, numbness to the groin, loss of control of bowel or bladder, complete inability to walk, complete numbness of the right leg, immediately come back to the ED for further evaluation.

## 2023-07-27 NOTE — ED Triage Notes (Signed)
Pt came to er reports right hip pain x 2 days, pt states she was stepping over a baby gate and feels like she turned wrong, pain shot up her back and hasn't been able to walk since then

## 2023-07-27 NOTE — ED Provider Notes (Signed)
[facility] ED  EMERGENCY DEPARTMENT ENCOUNTER      Pt Name: Daisy Orr  MRN: [id number]  Birthdate [birth year]  Date of evaluation: 07/27/2023  Provider: Mariam Dollar, MD  9:51 AM    CHIEF COMPLAINT       Chief Complaint   Patient presents with    Hip Pain         HISTORY OF PRESENT ILLNESS    Daisy Orr is a 72 y.o. female who presents to the emergency department right lower back pain radiating down the right anterior thigh    HPI  Patient is a 72 year old female presenting to the ED due to concern for right lower back pain radiating over the right buttock down the right anterior thigh.  Patient has a past medical history of arthritis, back pain, COPD.  Patient endorsed that 2 days ago, she was stepping over a baby gate and stepped awkwardly with her right leg.  She immediately felt pain to her right lower back.  She heard a popping and felt a popping sensation.  She never fell to the ground.  She has been able to ambulate but has been having discomfort to the right leg when doing so and thus she has been laying around and sitting more so than ambulating.  She denies any pain or numbness into the groin, loss of control of bowel or bladder, numbness down the right leg, fevers or night sweats or weight loss that is unintended, no history of IV drug abuse.  No previous lower back injuries or surgeries, per her.  Nursing Notes were reviewed.    REVIEW OF SYSTEMS       Review of Systems   Constitutional:  Negative for activity change, appetite change, chills, fatigue and fever.   HENT:  Negative for congestion, postnasal drip, rhinorrhea, sinus pressure and sinus pain.    Eyes:  Negative for photophobia, pain, discharge, redness and itching.   Respiratory:  Negative for cough, chest tightness and wheezing.    Cardiovascular:  Negative for chest pain, palpitations and leg swelling.   Gastrointestinal:  Negative for abdominal pain, diarrhea, nausea and vomiting.   Endocrine: Negative for cold intolerance and heat  intolerance.   Genitourinary:  Negative for decreased urine volume, dysuria, frequency, hematuria and urgency.   Musculoskeletal:  Positive for back pain (Right lower back pain radiating over the right anterior thigh) and gait problem. Negative for arthralgias, joint swelling, myalgias, neck pain and neck stiffness.   Skin:  Negative for color change and pallor.   Neurological:  Negative for syncope, weakness, light-headedness and headaches.   Psychiatric/Behavioral:  Negative for agitation, behavioral problems, confusion and decreased concentration.        Except as noted above the remainder of the review of systems was reviewed and negative.       PAST MEDICAL HISTORY     Past Medical History:   Diagnosis Date    Arthritis     Back pain     COPD (chronic obstructive pulmonary disease) (HCC)     Ulcer          SURGICAL HISTORY     No past surgical history on file.      CURRENT MEDICATIONS       Discharge Medication List as of 07/27/2023  3:56 PM        CONTINUE these medications which have NOT CHANGED    Details   levothyroxine (SYNTHROID) 25 MCG tablet Take 25 mcg by mouth dailyHistorical Med  famotidine (PEPCID) 20 MG tablet Take 20 mg by mouth dailyHistorical Med      venlafaxine (EFFEXOR) 75 MG tablet Take 75 mg by mouth 3 times dailyHistorical Med      albuterol sulfate HFA 108 (90 Base) MCG/ACT inhaler Inhale 2 puffs into the lungs every 4 hours as neededHistorical Med      Cholecalciferol 2000 units CAPS Take 2,000 Units by mouth dailyHistorical Med      vitamin D (ERGOCALCIFEROL) 50000 UNITS CAPS capsule Take 50,000 Units by mouth once a weekHistorical Med      cyclobenzaprine (FLEXERIL) 10 MG tablet Take 1 tablet by mouth 3 times daily as needed for Muscle spasms, Disp-15 tablet, R-0             ALLERGIES     Patient has no known allergies.    FAMILY HISTORY     No family history on file.       SOCIAL HISTORY       Social History     Socioeconomic History    Marital status: Divorced   Tobacco Use     Smoking status: Every Day     Types: Cigarettes    Smokeless tobacco: Never   Vaping Use    Vaping status: Never Used   Substance and Sexual Activity    Alcohol use: Yes     Comment: social    Drug use: Yes     Types: Marijuana Sheran Fava)     Social Determinants of Health     Financial Resource Strain: Low Risk  (07/15/2022)    Received from [facility], [facility]    Overall Financial Resource Strain (CARDIA)     Difficulty of Paying Living Expenses: Not very hard   Food Insecurity: Food Insecurity Present (07/15/2022)    Received from [place], [facility]    Hunger Vital Sign     Worried About Running Out of Food in the Last Year: Sometimes true     Ran Out of Food in the Last Year: Sometimes true   Transportation Needs: No Transportation Needs (07/15/2022)    Received from [facility], [facility] - Transportation     Lack of Transportation (Medical): No     Lack of Transportation (Non-Medical): No   Physical Activity: Unknown (07/15/2022)    Received from [facility], [facility]    Exercise Vital Sign     Minutes of Exercise per Session: Patient declined   Social Connections: Moderately Isolated (07/15/2022)    Received from [facility], [facility]    Social Connection and Isolation Panel [NHANES]     Frequency of Communication with Friends and Family: Twice a week     Frequency of Social Gatherings with Friends and Family: Twice a week     Attends Religious Services: More than 4 times per year     Active Member of [employer] or Organizations: No     Marital Status: Divorced   Housing Stability: Unknown (07/15/2022)    Received from [facility], [facility]    Housing Stability Vital Sign     Unable to Pay for Housing in the Last Year: No     Unstable Housing in the Last Year: No       SCREENINGS         Glasgow Coma Scale  Eye Opening: Spontaneous  Best Verbal Response: Oriented  Best Motor Response: Obeys commands  Glasgow Coma Scale Score: 15  CIWA Assessment  BP: 137/79  Pulse: 72                 PHYSICAL EXAM       ED Triage Vitals [07/27/23 1446]   BP Systolic BP Percentile Diastolic BP Percentile Temp Temp Source Pulse Respirations SpO2   137/79 -- -- 98.4 F (36.9 C) Oral 72 20 99 %      Height Weight         -- --             Physical Exam  Vitals and nursing note reviewed.   Constitutional:       General: She is not in acute distress.     Appearance: She is well-developed. She is not ill-appearing, toxic-appearing or diaphoretic.   HENT:      Head: Normocephalic and atraumatic.      Right Ear: Tympanic membrane, ear canal and external ear normal. There is no impacted cerumen.      Left Ear: Tympanic membrane, ear canal and external ear normal. There is no impacted cerumen.      Nose: Nose normal. No congestion or rhinorrhea.      Mouth/Throat:      Mouth: Mucous membranes are moist.      Pharynx: Oropharynx is clear. No pharyngeal swelling, oropharyngeal exudate or posterior oropharyngeal erythema.   Eyes:      General:         Right eye: No discharge.         Left eye: No discharge.      Extraocular Movements: Extraocular movements intact.      Conjunctiva/sclera: Conjunctivae normal.      Pupils: Pupils are equal, round, and reactive to light.   Cardiovascular:      Rate and Rhythm: Normal rate and regular rhythm.      Heart sounds: Normal heart sounds. No murmur heard.     No gallop.   Pulmonary:      Effort: Pulmonary effort is normal. No respiratory distress.      Breath sounds: Normal breath sounds. No wheezing or rales.   Chest:      Chest wall: No tenderness.   Abdominal:      General: Abdomen is flat. There is no distension.      Palpations: Abdomen is soft.      Tenderness: There is no abdominal tenderness. There is no right CVA tenderness, left CVA tenderness, guarding or rebound.   Musculoskeletal:         General: Tenderness present. Normal range of motion.      Cervical back: Normal range of motion and neck supple. No  rigidity or tenderness.      Right lower leg: No edema.      Left lower leg: No edema.      Comments: Tender to palpation alongside the paraspinal musculature on the right side of the lumbar spine, no mid spinal tenderness or step-offs or deformities.  Patient has 5/5 strength with flexion and extension to the right hip and right knee.  Dorsi and plantarflexion is 5/5 to the right ankle.  She is able to wiggle all 5 toes in the right foot.  Sensation was intact and equal to the dorsal and ventral aspect of the right lower extremity and to the dorsal and plantar aspect of the right foot.  Patient's pain pattern appears to go from the right lower back over the lateral aspect of the right buttock alongside the L5 distribution for  sensory, down the L5 lateral sensory segment of the right thigh but also involves the anterior L4 sensory down to just above the knee.   Skin:     General: Skin is warm and dry.      Capillary Refill: Capillary refill takes less than 2 seconds.      Coloration: Skin is not pale.   Neurological:      General: No focal deficit present.      Mental Status: She is alert and oriented to person, place, and time.      Cranial Nerves: No cranial nerve deficit.      Motor: No weakness.   Psychiatric:         Mood and Affect: Mood normal. Mood is not anxious or depressed.         Behavior: Behavior normal.         DIAGNOSTIC RESULTS     EKG: All EKG's are interpreted by the Emergency Department Physician who either signs or Co-signs this chart in the absence of a cardiologist.        RADIOLOGY:   Non-plain film images such as CT, Ultrasound and MRI are read by the radiologist. Plain radiographic images are visualized and preliminarily interpreted by the emergency physician with the below findings:    XR HIP RIGHT (2-3 VIEWS)   Final Result   Mild degenerative changes seen within the right hip with no evidence of acute   bony abnormality.               Interpretation per the Radiologist below, if  available at the time of this note:    XR HIP RIGHT (2-3 VIEWS)   Final Result   Mild degenerative changes seen within the right hip with no evidence of acute   bony abnormality.               ED BEDSIDE ULTRASOUND:   Performed by ED Physician - none    LABS:  Labs Reviewed - No data to display    All other labs were within normal range or not returned as of this dictation.    EMERGENCY DEPARTMENT COURSE and DIFFERENTIAL DIAGNOSIS/MDM:   Vitals:    Vitals:    07/27/23 1446   BP: 137/79   Pulse: 72   Resp: 20   Temp: 98.4 F (36.9 C)   TempSrc: Oral   SpO2: 99%           Medical Decision Making  Amount and/or Complexity of Data Reviewed  Radiology: ordered.    Risk  OTC drugs.  Prescription drug management.      Patient is a 72 year old female presenting to the ED due to concern for right lower back pain radiating over the right buttock down the right anterior thigh    With initial presentation, patient was not in immediate distress.  Patient was afebrile.  Patient was not tachycardic or hypotensive.  Patient was not tachypneic or hypoxic.  Patient is a past medical history of arthritis and COPD.  She endorsed that 2 days ago, she was stepping over a baby gate and she stepped awkwardly with her right leg.  During that time, she endorsed she felt and heard a pop in her right lower back.  She never fell to the ground.  Since then she has been able to ambulate but has had discomfort to the right lower back with pain radiating down her right leg when bearing weight to the right lower extremity and ambulating.  She denied any red flag symptoms such as pain or numbness into the groin, loss of control of bowel or bladder, numbness down the right leg, fevers, night sweats or unintended weight loss, no history of IV drug abuse and no previous traumatic lower back injuries or surgeries done to the lower back.    Upon exam, she had tenderness to palpation to the paraspinal musculature on the right side of the lumbar spine but  no mid lumbar tenderness or step-offs or deformities.  Her radiation of pain was sharp and appeared to be going over the L5 distribution initially on the right lateral thigh and also appeared to involve the anterior right thigh over the L4 distribution.  She did not have any radiation of pain down past the right knee.  She had a 2+ right sided DTR to the right knee and to the right Achilles.  Sensation was intact and equal to the bilateral lower extremities.  She had 5/5 strength with flexion/extension to the right hip and right knee and had 5/5 strength with dorsi and plantarflexion to the right ankle and she was able to wiggle all 5 toes in the right foot.  Given that patient had no red flag symptoms or history as previously mentioned, and given that she had no mid spinal tenderness, I did not obtain any CT imaging at this time.  While patient was in triage, a right hip x-ray was obtained which did not reveal any obvious fractures or dislocation to the right hip.  Patient's pain was treated with a lidocaine patch, IM Toradol and also oral prednisone.  I did give her prescriptions for lidocaine patches, naproxen and prednisone for home.  I gave her strict oral and written return precautions and she was stable upon discharging from the ED.      REASSESSMENT          CRITICAL CARE TIME   Total Critical Care time was 0 minutes, excluding separately reportable procedures.  There was a high probability of clinically significant/life threatening deterioration in the patient's condition which required my urgent intervention.      CONSULTS:  None    PROCEDURES:  Unless otherwise noted below, none     Procedures        FINAL IMPRESSION      1. Sciatica of right side          DISPOSITION/PLAN   DISPOSITION Decision To Discharge 07/27/2023 03:34:31 PM  Condition at Disposition: Data Unavailable      PATIENT REFERRED TO:  Geryl Rankins, MD  [address] [place] [address]  [phone]    Schedule an  appointment as soon as possible for a visit on 07/27/2023        DISCHARGE MEDICATIONS:  Discharge Medication List as of 07/27/2023  3:56 PM        Controlled Substances Monitoring:     RX Monitoring Attestation Periodic Controlled Substance Monitoring   03/28/2016   4:15 PM The Prescription Monitoring Report for this patient was reviewed today. No signs of potential drug abuse or diversion identified.       (Please note that portions of this note were completed with a voice recognition program.  Efforts were made to edit the dictations but occasionally words are mis-transcribed.)    Mariam Dollar, MD (electronically signed)  Attending Emergency Physician           Mariam Dollar, MD  08/02/23 [phone]

## 2023-08-12 ENCOUNTER — Ambulatory Visit: Admit: 2023-08-12 | Discharge: 2023-08-12 | Payer: MEDICARE | Attending: Family | Primary: Internal Medicine

## 2023-08-12 ENCOUNTER — Ambulatory Visit: Admit: 2023-08-12 | Discharge: 2023-08-12 | Payer: MEDICARE | Primary: Internal Medicine

## 2023-08-12 DIAGNOSIS — M545 Low back pain, unspecified: Secondary | ICD-10-CM

## 2023-08-12 MED ORDER — CHOLECALCIFEROL 50 MCG (2000 UT) PO CAPS
50 MCG (2000 UT) | ORAL_CAPSULE | Freq: Every day | ORAL | 0 refills | Status: DC
Start: 2023-08-12 — End: 2023-09-06

## 2023-08-12 MED ORDER — GABAPENTIN 100 MG PO CAPS
100 MG | ORAL_CAPSULE | Freq: Two times a day (BID) | ORAL | 0 refills | Status: DC
Start: 2023-08-12 — End: 2023-08-27

## 2023-08-12 MED ORDER — CANE MISC
Freq: Every day | 0 refills | Status: DC
Start: 2023-08-12 — End: 2023-08-27

## 2023-08-12 MED ORDER — TIZANIDINE HCL 2 MG PO TABS
2 MG | ORAL_TABLET | Freq: Every evening | ORAL | 0 refills | Status: DC | PRN
Start: 2023-08-12 — End: 2023-08-27

## 2023-08-12 MED ORDER — LUMBAR BACK BRACE/SUPPORT PAD MISC
Freq: Every day | 0 refills | Status: DC | PRN
Start: 2023-08-12 — End: 2023-08-27

## 2023-08-12 NOTE — Progress Notes (Signed)
Subjective:      Patient ID: Daisy Orr is a 72 y.o. female who presents today for:  Chief Complaint   Patient presents with    Back Pain     Patient presents for back pain and pain on her right leg, pt states that pain it too strong and she has been taking only tylenol but its hasn't been working. Pt denies UTI sx.        HPI  Patient is here with low back pain that radiate down the right leg. Says it is painful all the time.   Says she feel like it always asleep .  She is using Tylenol that is not helping.   She has HX back pain int he past.   She was seen in the ER for this on 10/8 and given prednisone X5 daysm Naproxen, and Lidoderm patch.   XR of the hip showing some mild deg changes  Saw PCP 10/14. Says she did not change anything.   Says she does not want to do any physical therapy.  Says she is doing some exercises.     Past Medical History:   Diagnosis Date    Arthritis     Back pain     COPD (chronic obstructive pulmonary disease) (HCC)     Ulcer      No past surgical history on file.  Social History     Socioeconomic History    Marital status: Divorced     Spouse name: Not on file    Number of children: Not on file    Years of education: Not on file    Highest education level: Not on file   Occupational History    Not on file   Tobacco Use    Smoking status: Every Day     Types: Cigarettes    Smokeless tobacco: Never   Vaping Use    Vaping status: Never Used   Substance and Sexual Activity    Alcohol use: Yes     Comment: social    Drug use: Yes     Types: Marijuana Sheran Fava)    Sexual activity: Not on file   Other Topics Concern    Not on file   Social History Narrative    Not on file     Social Determinants of Health     Financial Resource Strain: Low Risk  (08/12/2023)    Overall Financial Resource Strain (CARDIA)     Difficulty of Paying Living Expenses: Not hard at all   Food Insecurity: No Food Insecurity (08/12/2023)    Hunger Vital Sign     Worried About Running Out of Food in the Last Year: Never  true     Ran Out of Food in the Last Year: Never true   Transportation Needs: Unknown (08/12/2023)    PRAPARE - Therapist, art (Medical): Not on file     Lack of Transportation (Non-Medical): No   Physical Activity: Unknown (07/15/2022)    Received from [facility], [facility]    Exercise Vital Sign     Days of Exercise per Week: Not on file     Minutes of Exercise per Session: Patient declined   Stress: Not on file   Social Connections: Moderately Isolated (07/15/2022)    Received from [facility], [facility]    Social Connection and Isolation Panel [NHANES]     Frequency of Communication with Friends and Family: Twice a week  Frequency of Social Gatherings with Friends and Family: Twice a week     Attends Religious Services: More than 4 times per year     Active Member of [employer] or Organizations: No     Attends Engineer, structural: Not on file     Marital Status: Divorced   Intimate Partner Violence: Not on file   Housing Stability: Unknown (08/12/2023)    Housing Stability Vital Sign     Unable to Pay for Housing in the Last Year: Not on file     Number of Times Moved in the Last Year: Not on file     Homeless in the Last Year: No     No family history on file.  No Known Allergies  Current Outpatient Medications   Medication Sig Dispense Refill    gabapentin (NEURONTIN) 100 MG capsule Take 1 capsule by mouth 2 times daily for 30 days. Intended supply: 30 days 60 capsule 0    tiZANidine (ZANAFLEX) 2 MG tablet Take 1 tablet by mouth nightly as needed (back spasm and pain) 15 tablet 0    vitamin D (CHOLECALCIFEROL) 50 MCG (2000 UT) CAPS capsule Take 1 capsule by mouth daily 30 capsule 0    Misc. Devices (CANE) MISC 1 Units by Does not apply route daily 1 each 0    Elastic Bandages & Supports (LUMBAR BACK BRACE/SUPPORT PAD) MISC 1 each by Does not apply route daily as needed (back pain) 1 each 0    lidocaine (HM LIDOCAINE PATCH) 4 % external patch Place 1  patch onto the skin daily 10 each 0    naproxen (NAPROSYN) 500 MG tablet Take 1 tablet by mouth 2 times daily as needed for Pain 30 tablet 3    levothyroxine (SYNTHROID) 25 MCG tablet Take 1 tablet by mouth daily      famotidine (PEPCID) 20 MG tablet Take 1 tablet by mouth daily      venlafaxine (EFFEXOR) 75 MG tablet Take 1 tablet by mouth 3 times daily      albuterol sulfate HFA 108 (90 Base) MCG/ACT inhaler Inhale 2 puffs into the lungs every 4 hours as needed      vitamin D (ERGOCALCIFEROL) 50000 UNITS CAPS capsule Take 1 capsule by mouth once a week      cyclobenzaprine (FLEXERIL) 10 MG tablet Take 1 tablet by mouth 3 times daily as needed for Muscle spasms 15 tablet 0     No current facility-administered medications for this visit.          Review of Systems   Constitutional:  Positive for activity change. Negative for appetite change, chills, diaphoresis, fatigue, fever and unexpected weight change.   Cardiovascular:  Negative for chest pain.   Gastrointestinal:  Negative for abdominal pain, diarrhea, nausea and vomiting.   Musculoskeletal:  Positive for arthralgias, back pain, gait problem and myalgias. Negative for neck pain and neck stiffness.   Skin:  Negative for color change and rash.   Neurological:  Negative for dizziness, weakness, light-headedness and headaches.   Hematological:  Negative for adenopathy.       Objective:   BP 132/70 (Site: Right Upper Arm, Position: Sitting, Cuff Size: Medium Adult)   Pulse 82   Temp 97.5 F (36.4 C)   Ht 1.524 m (5')   Wt 56.7 kg (125 lb)   SpO2 98%   BMI 24.41 kg/m     Physical Exam  Vitals reviewed.   Constitutional:  General: She is awake. She is not in acute distress.     Appearance: Normal appearance. She is well-developed, well-groomed and normal weight. She is not ill-appearing, toxic-appearing or diaphoretic.   HENT:      Head: Normocephalic and atraumatic.      Right Ear: Hearing normal.      Left Ear: Hearing normal.      Mouth/Throat:       Lips: Pink.   Eyes:      General: Lids are normal.   Cardiovascular:      Rate and Rhythm: Normal rate and regular rhythm.      Heart sounds: Normal heart sounds, S1 normal and S2 normal.   Pulmonary:      Effort: Pulmonary effort is normal.      Breath sounds: Normal breath sounds and air entry.   Abdominal:      General: Abdomen is flat. Bowel sounds are normal.      Palpations: Abdomen is soft.      Tenderness: There is no abdominal tenderness.   Musculoskeletal:         General: Tenderness present. No swelling, deformity or signs of injury.      Lumbar back: Tenderness present. No swelling, edema, deformity, signs of trauma, lacerations, spasms or bony tenderness. Decreased range of motion. Positive right straight leg raise test and positive left straight leg raise test. No scoliosis.      Right lower leg: No edema.      Left lower leg: No edema.   Skin:     General: Skin is warm and dry.      Capillary Refill: Capillary refill takes less than 2 seconds.      Findings: No bruising, erythema or rash.   Neurological:      General: No focal deficit present.      Mental Status: She is alert and oriented to person, place, and time. Mental status is at baseline.   Psychiatric:         Attention and Perception: Attention and perception normal.         Mood and Affect: Mood and affect normal.         Speech: Speech normal.         Behavior: Behavior normal. Behavior is cooperative.         Thought Content: Thought content normal.         Cognition and Memory: Cognition and memory normal.         Judgment: Judgment normal.         Assessment:       Diagnosis Orders   1. Lumbar back pain  gabapentin (NEURONTIN) 100 MG capsule    tiZANidine (ZANAFLEX) 2 MG tablet    vitamin D (CHOLECALCIFEROL) 50 MCG (2000 UT) CAPS capsule    XR LUMBAR SPINE (2-3 VIEWS)    [facility] [place], Joyce Copa DO Orthopedic Surgery, [facility]. Devices (CANE) MISC    Elastic Bandages & Supports (LUMBAR BACK BRACE/SUPPORT PAD) MISC      2.  Radiculopathy with lower extremity symptoms  XR LUMBAR SPINE (2-3 VIEWS)    [facility] - [place], Joyce Copa DO Orthopedic Surgery, [facility]. Devices (CANE) MISC    Elastic Bandages & Supports (LUMBAR BACK BRACE/SUPPORT PAD) MISC      3. Osteoarthritis of knee, unspecified laterality, unspecified osteoarthritis type  Misc. Devices (CANE) MISC      4. Difficulty in walking  Misc.  Devices (CANE) MISC        No results found for this visit on 08/12/23.   Plan:     Assessment & Plan   Tephanie was seen today for back pain.    Diagnoses and all orders for this visit:    Lumbar back pain  -     gabapentin (NEURONTIN) 100 MG capsule; Take 1 capsule by mouth 2 times daily for 30 days. Intended supply: 30 days  -     tiZANidine (ZANAFLEX) 2 MG tablet; Take 1 tablet by mouth nightly as needed (back spasm and pain)  -     vitamin D (CHOLECALCIFEROL) 50 MCG (2000 UT) CAPS capsule; Take 1 capsule by mouth daily  -     XR LUMBAR SPINE (2-3 VIEWS); Future  [facility] - [place], Joyce Copa DO Orthopedic Surgery, [facility]. Devices (CANE) MISC; 1 Units by Does not apply route daily  -     Elastic Bandages & Supports (LUMBAR BACK BRACE/SUPPORT PAD) MISC; 1 each by Does not apply route daily as needed (back pain)    Radiculopathy with lower extremity symptoms  -     XR LUMBAR SPINE (2-3 VIEWS); Future  [facility] - [place], Joyce Copa DO Orthopedic Surgery, [facility]. Devices (CANE) MISC; 1 Units by Does not apply route daily  -     Elastic Bandages & Supports (LUMBAR BACK BRACE/SUPPORT PAD) MISC; 1 each by Does not apply route daily as needed (back pain)    Osteoarthritis of knee, unspecified laterality, unspecified osteoarthritis type  -     Misc. Devices (CANE) MISC; 1 Units by Does not apply route daily    Difficulty in walking  -     Misc. Devices (CANE) MISC; 1 Units by Does not apply route daily    Will check lumbar XR and send to [facility] for further evaluation.  She has declined PT at this  time.   Will start Neurontin for the nerve pain and muscle relaxer at night.   She has taken steroid, NSAID, and used Lidoderm.   Will also have her get established with a PCP as well.   Advised on use and SE of medications.     Orders Placed This Encounter   Procedures    XR LUMBAR SPINE (2-3 VIEWS)     Standing Status:   Future     Number of Occurrences:   1     Standing Expiration Date:   08/11/2024     Order Specific Question:   Reason for exam:     Answer:   lumbar back pain    [facility] - [place], Joyce Copa DO Orthopedic Surgery, [place]     Referral Priority:   Routine     Referral Type:   Surgical     Referral Reason:   Specialty Services Required     Referred to Provider:   Barth Kirks, DO     Requested Specialty:   Orthopaedic Surgery     Number of Visits Requested:   1     Orders Placed This Encounter   Medications    gabapentin (NEURONTIN) 100 MG capsule     Sig: Take 1 capsule by mouth 2 times daily for 30 days. Intended supply: 30 days     Dispense:  60 capsule     Refill:  0  tiZANidine (ZANAFLEX) 2 MG tablet     Sig: Take 1 tablet by mouth nightly as needed (back spasm and pain)     Dispense:  15 tablet     Refill:  0    vitamin D (CHOLECALCIFEROL) 50 MCG (2000 UT) CAPS capsule     Sig: Take 1 capsule by mouth daily     Dispense:  30 capsule     Refill:  0    Misc. Devices (CANE) MISC     Sig: 1 Units by Does not apply route daily     Dispense:  1 each     Refill:  0    Elastic Bandages & Supports (LUMBAR BACK BRACE/SUPPORT PAD) MISC     Sig: 1 each by Does not apply route daily as needed (back pain)     Dispense:  1 each     Refill:  0     Medications Discontinued During This Encounter   Medication Reason    Cholecalciferol 2000 units CAPS REORDER     Return for worsening of condition, if symptoms do not improve in 3-5 days.        Reviewed with the patient/family: current clinical status & medications.  Side effects of the medication prescribed today, as well as treatment plan/rationale and  result expectations have been discussed with the patient/family who expresses understanding. Patient will be discharged home in stable condition.    Follow up with PCP to evaluate treatment results or return if symptoms worsen or fail to improve. Discussed signs and symptoms which require immediate follow-up in ED/call to 911.  Understanding verbalized.     I have reviewed the patient's medical history in detail and updated the computerized patient record.    Deari Sessler Barnetta Chapel, APRN - CNP

## 2023-08-12 NOTE — Other (Signed)
Please advise patient that lumbar XR is showing disc space narrowing and arthritis. Would like her to make appointment with Ortho for further evaluation.

## 2023-08-19 ENCOUNTER — Encounter

## 2023-08-27 ENCOUNTER — Encounter: Admit: 2023-08-27 | Discharge: 2023-08-27 | Payer: MEDICARE | Attending: Family Medicine | Primary: Internal Medicine

## 2023-08-27 VITALS — BP 126/72 | HR 61 | Temp 97.20000°F | Ht 60.0 in | Wt 115.0 lb

## 2023-08-27 DIAGNOSIS — M159 Polyosteoarthritis, unspecified: Secondary | ICD-10-CM

## 2023-08-27 MED ORDER — TIZANIDINE HCL 2 MG PO TABS
2 | ORAL_TABLET | Freq: Every evening | ORAL | 0 refills | Status: DC | PRN
Start: 2023-08-27 — End: 2023-08-27

## 2023-08-27 MED ORDER — GABAPENTIN 100 MG PO CAPS
100 | ORAL_CAPSULE | Freq: Two times a day (BID) | ORAL | 0 refills | Status: AC
Start: 2023-08-27 — End: 2023-09-26

## 2023-08-27 MED ORDER — TIZANIDINE HCL 2 MG PO TABS
2 | ORAL_TABLET | Freq: Every evening | ORAL | 0 refills | Status: AC | PRN
Start: 2023-08-27 — End: 2023-09-26

## 2023-08-27 MED ORDER — ASPIRIN-ACETAMINOPHEN-CAFFEINE 250-250-65 MG PO TABS
250-250-65 | ORAL_TABLET | Freq: Every day | ORAL | 0 refills | Status: AC | PRN
Start: 2023-08-27 — End: ?

## 2023-08-27 MED ORDER — ASPIRIN-ACETAMINOPHEN-CAFFEINE 250-250-65 MG PO TABS
250-250-65 | ORAL_TABLET | Freq: Every day | ORAL | 0 refills | Status: DC | PRN
Start: 2023-08-27 — End: 2023-08-27

## 2023-08-27 MED ORDER — NAPROXEN 500 MG PO TABS
500 | ORAL_TABLET | Freq: Every day | ORAL | 0 refills | Status: AC | PRN
Start: 2023-08-27 — End: ?

## 2023-08-27 NOTE — Progress Notes (Unsigned)
Chief Complaint   Patient presents with    New Patient    Establish Care     Patient unsure if to establish care, she wants to meet provider first.   Patient has been in and out of [facility] due to a pinched nerve in the lower back which is painful and caused her to walk . It also caused numbness in legs. She was given naproxen, tizanadine, and gabpentin which has helped the pain.        HPI: Daisy Orr 72 y.o. female presenting for ***     Low back pain   Was seen by pcp - medication did not help   Pt was then seen by the walk in clinic - was given medication to see like it helps.    Naproxen did help.    Gabapentin 100 mg BID   Admits to radiation   Current Outpatient Medications   Medication Sig Dispense Refill    gabapentin (NEURONTIN) 100 MG capsule Take 1 capsule by mouth 2 times daily for 30 days. Intended supply: 30 days 60 capsule 0    tiZANidine (ZANAFLEX) 2 MG tablet Take 1 tablet by mouth nightly as needed (back spasm and pain) 15 tablet 0    vitamin D (CHOLECALCIFEROL) 50 MCG (2000 UT) CAPS capsule Take 1 capsule by mouth daily 30 capsule 0    lidocaine (HM LIDOCAINE PATCH) 4 % external patch Place 1 patch onto the skin daily 10 each 0    naproxen (NAPROSYN) 500 MG tablet Take 1 tablet by mouth 2 times daily as needed for Pain 30 tablet 3    levothyroxine (SYNTHROID) 25 MCG tablet Take 1 tablet by mouth daily      famotidine (PEPCID) 20 MG tablet Take 1 tablet by mouth daily      venlafaxine (EFFEXOR) 75 MG tablet Take 1 tablet by mouth 3 times daily      albuterol sulfate HFA 108 (90 Base) MCG/ACT inhaler Inhale 2 puffs into the lungs every 4 hours as needed      cyclobenzaprine (FLEXERIL) 10 MG tablet Take 1 tablet by mouth 3 times daily as needed for Muscle spasms 15 tablet 0    glycopyrrolate (ROBINUL) 1 MG tablet Take 1 tablet by mouth 3 times daily (Patient not taking: Reported on 08/27/2023)      denosumab (PROLIA) 60 MG/ML SOSY SC injection Inject 1 mL into the skin once (Patient not  taking: Reported on 08/27/2023)      Misc. Devices (CANE) MISC 1 Units by Does not apply route daily (Patient not taking: Reported on 08/27/2023) 1 each 0    Elastic Bandages & Supports (LUMBAR BACK BRACE/SUPPORT PAD) MISC 1 each by Does not apply route daily as needed (back pain) (Patient not taking: Reported on 08/27/2023) 1 each 0     No current facility-administered medications for this visit.        ROS***  CONSTITUTIONAL: The patient denies fevers, chills, sweats and body ache.  HEENT: Denies headache, blurry vision, eye pain, tinnitus, vertigo,  sore throat, neck or thyroid masses.  RESPIRATORY: Denies cough, sputum, hemoptysis.  CARDIAC: Denies chest pain, pressure, palpitations, Denies lower extremity edema.  GASTROINTESTINAL: Denies abdominal pain, constipation, diarrhea, bleeding in the stools,   GENITOURINARY: Denies dysuria, hematuria, nocturia or frequency, urinary incontinence.  NEUROLOGIC: Denies headaches, dizziness, syncope, weakness  MUSCULOSKELETAL: denies changes in range of motion, joint pain, stiffness.  ENDOCRINOLOGY: Denies heat or cold intolerance.   HEMATOLOGY: Denies easy bleeding  or blood transfusion,anemia  DERMATOLOGY: Denies changes in moles or pigmentation changes.  PSYCHIATRY: Denies depression, agitation or anxiety.    Past Medical History:   Diagnosis Date    Arthritis     Back pain     COPD (chronic obstructive pulmonary disease) (HCC)     Ulcer         No past surgical history on file.     No family history on file.     Social History     Socioeconomic History    Marital status: Divorced     Spouse name: Not on file    Number of children: Not on file    Years of education: Not on file    Highest education level: Not on file   Occupational History    Not on file   Tobacco Use    Smoking status: Every Day     Types: Cigarettes    Smokeless tobacco: Never   Vaping Use    Vaping status: Never Used   Substance and Sexual Activity    Alcohol use: Yes     Comment: social    Drug use: Yes      Types: Marijuana Sheran Fava)    Sexual activity: Not on file   Other Topics Concern    Not on file   Social History Narrative    Not on file     Social Determinants of Health     Financial Resource Strain: Low Risk  (08/12/2023)    Overall Financial Resource Strain (CARDIA)     Difficulty of Paying Living Expenses: Not hard at all   Food Insecurity: No Food Insecurity (08/12/2023)    Hunger Vital Sign     Worried About Running Out of Food in the Last Year: Never true     Ran Out of Food in the Last Year: Never true   Transportation Needs: Unknown (08/12/2023)    PRAPARE - Therapist, art (Medical): Not on file     Lack of Transportation (Non-Medical): No   Physical Activity: Unknown (07/15/2022)    Received from [facility], [facility]    Exercise Vital Sign     Days of Exercise per Week: Not on file     Minutes of Exercise per Session: Patient declined   Stress: Not on file   Social Connections: Moderately Isolated (07/15/2022)    Received from [facility], [facility]    Social Connection and Isolation Panel [NHANES]     Frequency of Communication with Friends and Family: Twice a week     Frequency of Social Gatherings with Friends and Family: Twice a week     Attends Religious Services: More than 4 times per year     Active Member of [employer] or Organizations: No     Attends Engineer, structural: Not on file     Marital Status: Divorced   Intimate Partner Violence: Not on file   Housing Stability: Unknown (08/12/2023)    Housing Stability Vital Sign     Unable to Pay for Housing in the Last Year: Not on file     Number of Times Moved in the Last Year: Not on file     Homeless in the Last Year: No        BP 126/72   Pulse 61   Temp 97.2 F (36.2 C) (Temporal)   Ht 1.524 m (5')   Wt 52.2 kg (115 lb)   SpO2 93%  BMI 22.46 kg/m        Physical Exam:    General appearance - alert, well appearing, and in no distress  Mental Status - alert, oriented to person,  place, and time  Eyes - pupils equal and reactive, extraocular eye movements intact   Ears - bilateral TM's and external ear canals normal   Nose - normal and patent, no erythema, discharge or polyps   Sinuses - Normal paranasal sinuses without tenderness   Throat - mucous membranes moist, pharynx normal without lesions   Neck - supple, no significant adenopathy   Thyroid - thyroid is normal in size without nodules or tenderness    Chest - clear to auscultation, no wheezes, rales or rhonchi, symmetric air entry   Heart - normal rate, regular rhythm, normal S1, S2, no murmurs, rubs, clicks or gallops  Abdomen - soft, nontender, nondistended, no masses or organomegaly   Back exam - full range of motion, no tenderness, palpable spasm or pain on motion   Neurological - alert, oriented, normal speech, no focal findings or movement disorder noted   Musculoskeletal - no joint tenderness, deformity or swelling   Extremities - peripheral pulses normal, no pedal edema, no clubbing or cyanosis   Skin - normal coloration and turgor, no rashes, no suspicious skin lesions noted    {PHYSICAL EXAM WITH PROVIDER CHOICES:21747}    Labs   No components found for: "TSHREFLEX"  No results found for: "TSH"        A/P: Shaterra Caris 72 y.o. female presenting for ***     1. Lumbar back pain  ***          Please note, this report has been partially produced using speech recognition software  and may cause  and /or contain errors related to that system including grammar, punctuation and spelling as well as words and phrases that may seem inappropriate. If there are questions or concerns please feel free to contact me to clarify.

## 2023-08-27 NOTE — Telephone Encounter (Signed)
Notified pt that they dropped their Social Security card at the front desk during check in.  Pt stated she would come to pick up at some point. Card is in cash drawer in envelope.

## 2023-09-01 ENCOUNTER — Inpatient Hospital Stay: Admit: 2023-09-01 | Payer: MEDICARE | Primary: Family Medicine

## 2023-09-01 ENCOUNTER — Ambulatory Visit: Admit: 2023-09-01 | Discharge: 2023-09-01 | Payer: MEDICARE | Attending: Orthopaedic Surgery | Primary: Family Medicine

## 2023-09-01 DIAGNOSIS — M545 Low back pain, unspecified: Secondary | ICD-10-CM

## 2023-09-01 NOTE — Progress Notes (Signed)
 Subjective:      Patient ID: Daisy Orr is a 72 y.o. female who presents today for:  Chief Complaint   Patient presents with    New Patient     Symptoms: Constant Lower back pain, Worse in the mornings, Sometimes trouble walking.   Onset: She was told sh

## 2023-09-03 ENCOUNTER — Encounter: Admit: 2023-09-03 | Admitting: Family

## 2023-09-03 DIAGNOSIS — M545 Low back pain, unspecified: Secondary | ICD-10-CM

## 2023-09-06 MED ORDER — VITAMIN D3 50 MCG (2000 UT) PO CAPS
50 | ORAL_CAPSULE | Freq: Every day | ORAL | 0 refills | Status: AC
Start: 2023-09-06 — End: ?

## 2023-09-06 NOTE — Telephone Encounter (Signed)
 Please approve or deny request. Thank you!    Rx requested:  Requested Prescriptions     Pending Prescriptions Disp Refills    VITAMIN D3 50 MCG (2000 UT) CAPS capsule [Pharmacy Med Name: VITAMIN D3 2,000 UNIT SOFTGEL] 90 capsule 1     Sig: TAKE 1 CAPSULE

## 2024-05-12 LAB — HM MAMMOGRAPHY
